# Patient Record
Sex: Male | Born: 1975
Health system: Southern US, Community
[De-identification: ages and names within clinical notes are randomized; demographics above are authoritative.]

## PROBLEM LIST (undated history)

## (undated) DIAGNOSIS — C801 Malignant (primary) neoplasm, unspecified: Secondary | ICD-10-CM

## (undated) DIAGNOSIS — D573 Sickle-cell trait: Secondary | ICD-10-CM

## (undated) DIAGNOSIS — I10 Essential (primary) hypertension: Secondary | ICD-10-CM

## (undated) DIAGNOSIS — R1909 Other intra-abdominal and pelvic swelling, mass and lump: Secondary | ICD-10-CM

## (undated) HISTORY — PX: NO PAST SURGERIES: SHX2092

---

## 2008-05-04 ENCOUNTER — Emergency Department (HOSPITAL_COMMUNITY): Admission: EM | Admit: 2008-05-04 | Discharge: 2008-05-04 | Payer: Self-pay | Admitting: Emergency Medicine

## 2009-04-14 ENCOUNTER — Encounter: Admission: RE | Admit: 2009-04-14 | Discharge: 2009-04-14 | Payer: Self-pay | Admitting: Urology

## 2011-04-10 ENCOUNTER — Emergency Department (HOSPITAL_COMMUNITY)
Admission: EM | Admit: 2011-04-10 | Discharge: 2011-04-10 | Disposition: A | Discharge status: Home / Self Care | Payer: No Typology Code available for payment source | Attending: Emergency Medicine | Admitting: Emergency Medicine

## 2011-04-10 DIAGNOSIS — S339XXA Sprain of unspecified parts of lumbar spine and pelvis, initial encounter: Secondary | ICD-10-CM | POA: Insufficient documentation

## 2011-04-10 DIAGNOSIS — F17200 Nicotine dependence, unspecified, uncomplicated: Secondary | ICD-10-CM | POA: Insufficient documentation

## 2011-04-10 DIAGNOSIS — M545 Low back pain, unspecified: Secondary | ICD-10-CM | POA: Insufficient documentation

## 2011-04-10 DIAGNOSIS — S39012A Strain of muscle, fascia and tendon of lower back, initial encounter: Secondary | ICD-10-CM

## 2011-04-10 MED ORDER — KETOROLAC TROMETHAMINE 60 MG/2ML IM SOLN
INTRAMUSCULAR | Status: AC
  Filled 2011-04-10: qty 2

## 2011-04-10 MED ORDER — DIAZEPAM 5 MG PO TABS
5.0000 mg | ORAL_TABLET | Freq: Once | ORAL | Status: AC
  Administered 2011-04-10: 5 mg via ORAL
  Filled 2011-04-10: qty 1

## 2011-04-10 MED ORDER — KETOROLAC TROMETHAMINE 60 MG/2ML IM SOLN
60.0000 mg | Freq: Once | INTRAMUSCULAR | Status: AC
  Administered 2011-04-10: 60 mg via INTRAMUSCULAR
  Filled 2011-04-10: qty 2

## 2011-04-10 MED ORDER — DIAZEPAM 5 MG PO TABS: 5.0000 mg | ORAL_TABLET | Freq: Three times a day (TID) | ORAL | Status: AC | PRN

## 2011-04-10 MED ORDER — IBUPROFEN 800 MG PO TABS: 800.0000 mg | ORAL_TABLET | Freq: Three times a day (TID) | ORAL | Status: AC
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
## 2011-04-11 NOTE — ED Provider Notes (Signed)
Medical screening examination/treatment/procedure(s) were performed by non-physician practitioner and as supervising physician I was immediately available for consultation/collaboration. Akshara Blumenthal Y.   Huong Luthi Y. Ronny Korff, MD 04/11/11 1027 

## 2011-11-27 ENCOUNTER — Encounter (HOSPITAL_COMMUNITY): Payer: Self-pay | Admitting: Emergency Medicine

## 2011-11-27 ENCOUNTER — Emergency Department (HOSPITAL_COMMUNITY)
Admission: EM | Admit: 2011-11-27 | Discharge: 2011-11-28 | Disposition: A | Discharge status: Home / Self Care | Payer: Self-pay | Attending: Emergency Medicine | Admitting: Emergency Medicine

## 2011-11-27 DIAGNOSIS — F17200 Nicotine dependence, unspecified, uncomplicated: Secondary | ICD-10-CM | POA: Insufficient documentation

## 2011-11-27 DIAGNOSIS — L237 Allergic contact dermatitis due to plants, except food: Secondary | ICD-10-CM

## 2011-11-27 DIAGNOSIS — L255 Unspecified contact dermatitis due to plants, except food: Secondary | ICD-10-CM | POA: Insufficient documentation

## 2011-11-27 NOTE — ED Notes (Signed)
Pt alert, nad, arrives from home, c/o rash to hands and arms, onset several days ago, resp even unlabored

## 2011-11-28 MED ORDER — DEXAMETHASONE SODIUM PHOSPHATE 10 MG/ML IJ SOLN
10.0000 mg | Freq: Once | INTRAMUSCULAR | Status: AC
  Administered 2011-11-28: 10 mg via INTRAMUSCULAR
  Filled 2011-11-28: qty 1

## 2011-11-28 NOTE — ED Provider Notes (Signed)
History     CSN: [id number]  Arrival date & time 11/27/11  2246   First MD Initiated Contact with Patient 11/28/11 0044      Chief Complaint  Patient presents with  . Poison Ivy   HPI  History provided by the patient. Patient is a 36 year old male with no significant PMH who presents with concerns for rash of arms from poison ivy. Patient reports working outside last Tuesday 5 days ago. Patient believes he was in contact with poison ivy or poison oak and developed a rash on his forearms. Rash has been read with bumps and occasional "fluid blisters". Patient has been taking Benadryl for the itching symptoms as well as using, and motion. Patient states rash seems like it is not improving and is spreading some of the arm. Patient reports wash all clothing in laundry. He has no pets. Patient denies any other symptoms.    History reviewed. No pertinent past medical history.  History reviewed. No pertinent past surgical history.  No family history on file.  History  Substance Use Topics  . Smoking status: Current Everyday Smoker  . Smokeless tobacco: Not on file  . Alcohol Use: Yes      Review of Systems  Constitutional: Negative for fever and chills.  Respiratory: Negative for shortness of breath.   Cardiovascular: Negative for chest pain.  Skin: Positive for rash.    Allergies  Review of patient's allergies indicates no known allergies.  Home Medications  No current outpatient prescriptions on file.  BP 156/89  Pulse 80  Temp 98 F (36.7 C) (Oral)  Resp 16  Wt 195 lb (88.451 kg)  SpO2 99%  Physical Exam  Nursing note and vitals reviewed. Constitutional: He is oriented to person, place, and time. He appears well-developed and well-nourished. No distress.  HENT:  Head: Normocephalic.  Neck: Normal range of motion. Neck supple.  Cardiovascular: Normal rate and regular rhythm.   Pulmonary/Chest: Effort normal and breath sounds normal. No stridor. No respiratory  distress. He has no wheezes. He has no rales.  Neurological: He is alert and oriented to person, place, and time.  Skin: Skin is warm. Rash noted.       Erythematous macular papular rash along the dorsal surface of bilateral forearms with some bulla.    ED Course  Procedures    1. Contact dermatitis due to poison ivy       MDM  Patient seen and evaluated. Rash is consistent with poison ivy.       Angus Seller, [place] 11/28/11 [phone]

## 2011-11-28 NOTE — ED Provider Notes (Signed)
Medical screening examination/treatment/procedure(s) were performed by non-physician practitioner and as supervising physician I was immediately available for consultation/collaboration.  Tierre Gerard M Tayo Maute, MD 11/28/11 0728 

## 2016-01-09 ENCOUNTER — Emergency Department (HOSPITAL_COMMUNITY)
Admission: EM | Admit: 2016-01-09 | Discharge: 2016-01-09 | Disposition: A | Discharge status: Home / Self Care | Payer: Self-pay | Attending: Emergency Medicine | Admitting: Emergency Medicine

## 2016-01-09 ENCOUNTER — Encounter (HOSPITAL_COMMUNITY): Payer: Self-pay | Admitting: Emergency Medicine

## 2016-01-09 DIAGNOSIS — N481 Balanitis: Secondary | ICD-10-CM | POA: Insufficient documentation

## 2016-01-09 DIAGNOSIS — F17200 Nicotine dependence, unspecified, uncomplicated: Secondary | ICD-10-CM | POA: Insufficient documentation

## 2016-01-09 DIAGNOSIS — N4889 Other specified disorders of penis: Secondary | ICD-10-CM

## 2016-01-09 MED ORDER — IBUPROFEN 800 MG PO TABS: 800.0000 mg | ORAL_TABLET | Freq: Three times a day (TID) | ORAL | 0 refills | Status: DC | PRN

## 2016-01-09 MED ORDER — HYDROCODONE-ACETAMINOPHEN 5-325 MG PO TABS: 1.0000 | ORAL_TABLET | Freq: Four times a day (QID) | ORAL | 0 refills | Status: DC | PRN

## 2016-01-09 MED ORDER — LIDOCAINE HCL (PF) 1 % IJ SOLN
INTRAMUSCULAR | Status: AC
  Filled 2016-01-09: qty 5

## 2016-01-09 MED ORDER — HYDROMORPHONE HCL 1 MG/ML IJ SOLN
1.0000 mg | Freq: Once | INTRAMUSCULAR | Status: DC
Start: 2016-01-09 — End: 2016-01-09

## 2016-01-09 MED ORDER — CEFTRIAXONE SODIUM 250 MG IJ SOLR
250.0000 mg | Freq: Once | INTRAMUSCULAR | Status: AC
Start: 2016-01-09 — End: 2016-01-09
  Administered 2016-01-09: 250 mg via INTRAMUSCULAR
  Filled 2016-01-09: qty 250

## 2016-01-09 MED ORDER — AZITHROMYCIN 250 MG PO TABS
1000.0000 mg | ORAL_TABLET | Freq: Once | ORAL | Status: AC
  Administered 2016-01-09: 1000 mg via ORAL
  Filled 2016-01-09: qty 4

## 2016-01-09 NOTE — ED Triage Notes (Signed)
Woke up this morning with a lot of penile swelling. Denies any known irritant or injury. Cannot move foreskin at this time; able to pass urine.

## 2016-01-09 NOTE — Discharge Instructions (Signed)
Return here tomorrow for a recheck. If your condition worsens you will need to go to [facility] for Urology assessment. Use warm water soaks and gently retract foreskin.

## 2016-01-10 ENCOUNTER — Encounter (HOSPITAL_COMMUNITY): Payer: Self-pay | Admitting: Emergency Medicine

## 2016-01-10 ENCOUNTER — Emergency Department (HOSPITAL_COMMUNITY)
Admission: EM | Admit: 2016-01-10 | Discharge: 2016-01-10 | Disposition: A | Discharge status: Home / Self Care | Payer: Self-pay | Attending: Emergency Medicine | Admitting: Emergency Medicine

## 2016-01-10 DIAGNOSIS — N481 Balanitis: Secondary | ICD-10-CM | POA: Insufficient documentation

## 2016-01-10 DIAGNOSIS — F17200 Nicotine dependence, unspecified, uncomplicated: Secondary | ICD-10-CM | POA: Insufficient documentation

## 2016-01-10 NOTE — ED Triage Notes (Signed)
Was told to come back today for recheck--

## 2016-01-10 NOTE — ED Provider Notes (Signed)
  [place] DEPT Provider Note   CSN: [id number] Arrival date & time: 01/10/16  1127     History   Chief Complaint Chief Complaint  Patient presents with  . Follow-up  . ballantitis    HPI Ronald Wilson is a 40 y.o. male.  HPI Patient presents to the emergency department for a follow-up from yesterday on balanitis.  The patient states that his condition has not worsened and he still able to urinate.  The patient states that nothing seems to make the condition worse overnight History reviewed. No pertinent past medical history.  There are no active problems to display for this patient.   History reviewed. No pertinent surgical history.     Home Medications    Prior to Admission medications   Medication Sig Start Date End Date Taking? Authorizing Provider  HYDROcodone-acetaminophen (NORCO/VICODIN) 5-325 MG tablet Take 1 tablet by mouth every 6 (six) hours as needed for moderate pain. 01/09/16   Dalia Heading, PA-C  ibuprofen (ADVIL,MOTRIN) 800 MG tablet Take 1 tablet (800 mg total) by mouth every 8 (eight) hours as needed. 01/09/16   Dalia Heading, PA-C    Family History No family history on file.  Social History Social History  Substance Use Topics  . Smoking status: Current Every Day Smoker  . Smokeless tobacco: Never Used  . Alcohol use Yes     Comment: 2-3 times a week     Allergies   Review of patient's allergies indicates no known allergies.   Review of Systems Review of Systems All other systems negative except as documented in the HPI. All pertinent positives and negatives as reviewed in the HPI.   Physical Exam Updated Vital Signs BP 147/85 (BP Location: Right Arm)   Pulse 118   Temp 99.1 F (37.3 C) (Oral)   Resp 20   SpO2 98%   Physical Exam  Genitourinary: Testes normal. Uncircumcised. No penile tenderness. No discharge found.        ED Treatments / Results  Labs (all labs ordered are listed, but only abnormal  results are displayed) Labs Reviewed - No data to display  EKG  EKG Interpretation None       Radiology No results found.  Procedures Procedures (including critical care time)  Medications Ordered in ED Medications - No data to display   Initial Impression / Assessment and Plan / ED Course  I have reviewed the triage vital signs and the nursing notes.  Pertinent labs & imaging results that were available during my care of the patient were reviewed by me and considered in my medical decision making (see chart for details).  Clinical Course   Patient will be referred to urology.  I again advised him to go to [facility] for any worsening in his condition.  The patient voices an understanding and all questions were answered has been some improvement in the area as I am able to retract the foreskin further than yesterday   Final Clinical Impressions(s) / ED Diagnoses   Final diagnoses:  None    New Prescriptions New Prescriptions   No medications on file     Dalia Heading, PA-C 01/10/16 [place], MD 01/25/16 [phone]

## 2016-01-10 NOTE — ED Notes (Signed)
Dr. Audie Pinto and Cathi Roan, PA at bedside to assess patient.

## 2016-01-10 NOTE — Discharge Instructions (Signed)
Go to [facility] for any worsening in your condition.  Follow-up with the urologist provided by calling his office first thing in the morning for an appointment.  Continue to use warm bath soaks and gently attempt to retract the foreskin

## 2016-01-10 NOTE — ED Provider Notes (Signed)
[place] DEPT Provider Note   CSN: [id number] Arrival date & time: 01/09/16  1129     History   Chief Complaint Chief Complaint  Patient presents with  . Groin Swelling    HPI Ronald Wilson is a 40 y.o. male.  HPI Patient presents to the emergency department with a one-day history of penile swelling.  The patient states that he woke up with swelling over the majority of his penis.  Patient states is still able to urinate and does not have any significant pain to the area.  Patient states that not able to retract the foreskin very much.  He states that nothing seems to make the condition better or worse. The patient denies chest pain, shortness of breath, headache,blurred vision, neck pain, fever, cough, weakness, numbness, dizziness, anorexia, edema, abdominal pain, nausea, vomiting, diarrhea, rash, back pain, dysuria, hematemesis, bloody stool, near syncope, or syncope. History reviewed. No pertinent past medical history.  There are no active problems to display for this patient.   History reviewed. No pertinent surgical history.     Home Medications    Prior to Admission medications   Medication Sig Start Date End Date Taking? Authorizing Provider  HYDROcodone-acetaminophen (NORCO/VICODIN) 5-325 MG tablet Take 1 tablet by mouth every 6 (six) hours as needed for moderate pain. 01/09/16   Dalia Heading, PA-C  ibuprofen (ADVIL,MOTRIN) 800 MG tablet Take 1 tablet (800 mg total) by mouth every 8 (eight) hours as needed. 01/09/16   Dalia Heading, PA-C    Family History History reviewed. No pertinent family history.  Social History Social History  Substance Use Topics  . Smoking status: Current Every Day Smoker  . Smokeless tobacco: Never Used  . Alcohol use Yes     Comment: 2-3 times a week     Allergies   Review of patient's allergies indicates no known allergies.   Review of Systems Review of Systems All other systems negative except as  documented in the HPI. All pertinent positives and negatives as reviewed in the HPI.  Physical Exam Updated Vital Signs BP 151/98   Pulse 91   Temp 99.2 F (37.3 C) (Oral)   Resp 20   Ht 5\' 10"  (1.778 m)   Wt 88.5 kg   SpO2 99%   BMI 27.98 kg/m   Physical Exam  Constitutional: He is oriented to person, place, and time. He appears well-developed and well-nourished. No distress.  HENT:  Head: Normocephalic and atraumatic.  Mouth/Throat: Oropharynx is clear and moist.  Eyes: Pupils are equal, round, and reactive to light.  Neck: Normal range of motion. Neck supple.  Cardiovascular: Normal rate, regular rhythm and normal heart sounds.  Exam reveals no gallop and no friction rub.   No murmur heard. Pulmonary/Chest: Effort normal and breath sounds normal. No respiratory distress. He has no wheezes.  Abdominal: Soft. Bowel sounds are normal. He exhibits no distension. There is no tenderness.  Genitourinary:     Neurological: He is alert and oriented to person, place, and time. He exhibits normal muscle tone. Coordination normal.  Skin: Skin is warm and dry. No rash noted. No erythema.  Psychiatric: He has a normal mood and affect. His behavior is normal.  Nursing note and vitals reviewed.    ED Treatments / Results  Labs (all labs ordered are listed, but only abnormal results are displayed) Labs Reviewed  GC/CHLAMYDIA PROBE AMP ([place]) NOT AT [facility]    EKG  EKG Interpretation None       Radiology  No results found.  Procedures Procedures (including critical care time)  Medications Ordered in ED Medications  cefTRIAXone (ROCEPHIN) injection 250 mg (250 mg Intramuscular Given 01/09/16 1513)  azithromycin (ZITHROMAX) tablet 1,000 mg (1,000 mg Oral Given 01/09/16 1515)     Initial Impression / Assessment and Plan / ED Course  I have reviewed the triage vital signs and the nursing notes.  Pertinent labs & imaging results that were available during my care of  the patient were reviewed by me and considered in my medical decision making (see chart for details).  Clinical Course    The patient will be treated for STDs.  Told to return here tomorrow for recheck.  Also given urology follow-up.  Told to use warm bath soaks and gently retract the foreskin  Final Clinical Impressions(s) / ED Diagnoses   Final diagnoses:  Balanitis  Penile swelling    New Prescriptions Discharge Medication List as of 01/09/2016  3:15 PM    START taking these medications   Details  HYDROcodone-acetaminophen (NORCO/VICODIN) 5-325 MG tablet Take 1 tablet by mouth every 6 (six) hours as needed for moderate pain., Starting Sat 01/09/2016, Print    ibuprofen (ADVIL,MOTRIN) 800 MG tablet Take 1 tablet (800 mg total) by mouth every 8 (eight) hours as needed., Starting Sat 01/09/2016, Print         Dalia Heading, PA-C 01/10/16 2004    Leonard Schwartz, MD 01/25/16 [phone]

## 2016-01-11 LAB — GC/CHLAMYDIA PROBE AMP (~~LOC~~) NOT AT ARMC
Chlamydia: NEGATIVE
Neisseria Gonorrhea: NEGATIVE

## 2016-10-18 ENCOUNTER — Emergency Department (HOSPITAL_COMMUNITY): Payer: Self-pay

## 2016-10-18 ENCOUNTER — Encounter (HOSPITAL_COMMUNITY): Payer: Self-pay | Admitting: Emergency Medicine

## 2016-10-18 ENCOUNTER — Emergency Department (HOSPITAL_COMMUNITY)
Admission: EM | Admit: 2016-10-18 | Discharge: 2016-10-19 | Disposition: A | Discharge status: Home / Self Care | Payer: Self-pay | Attending: Emergency Medicine | Admitting: Emergency Medicine

## 2016-10-18 DIAGNOSIS — F17210 Nicotine dependence, cigarettes, uncomplicated: Secondary | ICD-10-CM | POA: Insufficient documentation

## 2016-10-18 DIAGNOSIS — R600 Localized edema: Secondary | ICD-10-CM

## 2016-10-18 DIAGNOSIS — R2242 Localized swelling, mass and lump, left lower limb: Secondary | ICD-10-CM | POA: Insufficient documentation

## 2016-10-18 NOTE — ED Notes (Signed)
While  Having pt get undressed into a gown legs are assessed. Pt left leg is double the size of the right with no injury noted. Pt increased to an acuity 3.

## 2016-10-18 NOTE — ED Triage Notes (Addendum)
Pt c/o left ankle and foot swelling beginning 2 days ago. Denies injury/pain. States elevating extremity helps some. No swelling to right side.

## 2016-10-18 NOTE — ED Provider Notes (Signed)
[place] DEPT Provider Note   CSN: [id number] Arrival date & time: 10/18/16  2109  By signing my name below, I, Ephriam Jenkins, attest that this documentation has been prepared under the direction and in the presence of Malaijah Houchen PA-C.  Electronically Signed: Ephriam Jenkins, ED Scribe. 10/18/16. 12:00 AM.  History   Chief Complaint Chief Complaint  Patient presents with  . Leg Swelling    HPI HPI Comments: Ronald Wilson is a 41 y.o. male who presents to the Emergency Department complaining of persistent left lower extremity swelling that started 3 days ago. Pt notes mild pain in the extremity but states that it feels tight. He reports an exacerbation of pain when bearing weight on the extremity and during ambulation. He denies any known injury. No Hx of DVT. No recent surgeries. No recent long distance travel. No Hx of cancer. No chest pain, SOB, hemoptysis.  The history is provided by the patient. No language interpreter was used.    History reviewed. No pertinent past medical history.  There are no active problems to display for this patient.   History reviewed. No pertinent surgical history.   Home Medications    Prior to Admission medications   Medication Sig Start Date End Date Taking? Authorizing Provider  HYDROcodone-acetaminophen (NORCO/VICODIN) 5-325 MG tablet Take 1 tablet by mouth every 6 (six) hours as needed for moderate pain. 01/09/16   Lawyer, Harrell Gave, PA-C  ibuprofen (ADVIL,MOTRIN) 800 MG tablet Take 1 tablet (800 mg total) by mouth every 8 (eight) hours as needed. 01/09/16   Dalia Heading, PA-C   Family History No family history on file.  Social History Social History  Substance Use Topics  . Smoking status: Current Every Day Smoker  . Smokeless tobacco: Never Used  . Alcohol use Yes     Comment: 2-3 times a week    Allergies   Patient has no known allergies.   Review of Systems Review of Systems  Respiratory: Negative for shortness  of breath.   Cardiovascular: Negative for chest pain.  Musculoskeletal:       Left lower extremity swelling     Physical Exam Updated Vital Signs BP (!) 158/78 (BP Location: Left Arm)   Pulse 100   Temp 98.4 F (36.9 C) (Oral)   Resp 18   Ht 5\' 9"  (1.753 m)   Wt 88.9 kg (196 lb)   SpO2 98%   BMI 28.94 kg/m   Physical Exam  Constitutional: He appears well-developed and well-nourished. No distress.  HENT:  Head: Normocephalic and atraumatic.  Eyes: Conjunctivae and EOM are normal. No scleral icterus.  Neck: Normal range of motion.  Pulmonary/Chest: Effort normal. No respiratory distress.  Musculoskeletal:  LLE significantly swollen from middle of thigh down to foot, compared to right lower extremity. There is no color or temperature change noted. Tenderness to palpation throughout the lower extremity. There is calf tenderness present.2+ DP pulses.   Neurological: He is alert.  Skin: No rash noted. He is not diaphoretic.  Psychiatric: He has a normal mood and affect.  Nursing note and vitals reviewed.    ED Treatments / Results  DIAGNOSTIC STUDIES: Oxygen Saturation is 98% on RA, normal by my interpretation.  COORDINATION OF CARE: 11:51 PM-Discussed treatment plan with pt at bedside and pt agreed to plan.   Labs (all labs ordered are listed, but only abnormal results are displayed) Labs Reviewed - No data to display  EKG  EKG Interpretation None       Radiology  Dg Ankle Complete Left  Result Date: 10/18/2016 CLINICAL DATA:  Initial evaluation for acute swelling about left foot and ankle for 3 days. No pain. EXAM: LEFT ANKLE COMPLETE - 3+ VIEW; LEFT FOOT - COMPLETE 3+ VIEW COMPARISON:  None. FINDINGS: No acute fracture or dislocation about the left ankle or foot. Ankle mortise is approximated. Joint spaces well maintained without evidence for significant degenerative or erosive arthropathy. Osseous mineralization normal. Tiny posterior calcaneal enthesophyte  noted. Diffuse soft tissue swelling seen about the ankle and foot. No radiopaque foreign body. No soft tissue emphysema. IMPRESSION: 1. No acute osseous abnormality about the left ankle or foot. 2. Diffuse soft tissue swelling about the ankle and foot. Electronically Signed   By: Jeannine Boga M.D.   On: 10/18/2016 22:14   Dg Foot Complete Left  Result Date: 10/18/2016 CLINICAL DATA:  Initial evaluation for acute swelling about left foot and ankle for 3 days. No pain. EXAM: LEFT ANKLE COMPLETE - 3+ VIEW; LEFT FOOT - COMPLETE 3+ VIEW COMPARISON:  None. FINDINGS: No acute fracture or dislocation about the left ankle or foot. Ankle mortise is approximated. Joint spaces well maintained without evidence for significant degenerative or erosive arthropathy. Osseous mineralization normal. Tiny posterior calcaneal enthesophyte noted. Diffuse soft tissue swelling seen about the ankle and foot. No radiopaque foreign body. No soft tissue emphysema. IMPRESSION: 1. No acute osseous abnormality about the left ankle or foot. 2. Diffuse soft tissue swelling about the ankle and foot. Electronically Signed   By: Jeannine Boga M.D.   On: 10/18/2016 22:14    Procedures Procedures (including critical care time)  Medications Ordered in ED Medications  enoxaparin (LOVENOX) injection 90 mg (90 mg Subcutaneous Given 10/19/16 0031)     Initial Impression / Assessment and Plan / ED Course  I have reviewed the triage vital signs and the nursing notes.  Pertinent labs & imaging results that were available during my care of the patient were reviewed by me and considered in my medical decision making (see chart for details).     Patient presents to the ED for left lower extremity swelling that occurred 3 days ago. He states that symptoms began suddenly and he denies any injury, trauma or recent accident that brought the pain on. He is able to family but he reports pain with ambulation. He denies any previous  history of DVT, PE, recent surgery, recent immobilization, history of cancer, chest pain, hemoptysis, shortness of breath or palpitations. On physical exam his left lower extremity is significantly swollen from the middle of the thigh down to the foot. There is Tenderness present. Right lower extremity much smaller in comparison and appears normal. Pulses are present bilaterally. X-rays of the ankle and foot were negative for fracture, dislocation or other acute abnormality. There was significant soft tissue swelling noted. I have concern for DVT based on his symptoms and Well's score of 3. I have low likelihood for PE considering patient denies any chest pain, hemoptysis, shortness of breath and no previous history of PE. He is satting at 99% on room air. Heart rate normal today. Due to the fact that ultrasound is not available today, will give patient a shot of therapeutic Lovenox here in the ED and schedule his ultrasound for the morning. Patient states that he has a court date in the morning however I told him that I would give him a note as he will not be able to make it to his court date as it is very important  for him to return to the hospital in the morning for his ultrasound as this can be a life-threatening condition. Patient voiced understanding and agreed to return in the morning. Patient given note for court. Patient appears stable for discharge at this time. Strict return precautions given.  Final Clinical Impressions(s) / ED Diagnoses   Final diagnoses:  Leg edema    New Prescriptions New Prescriptions   No medications on file  I personally performed the services described in this documentation, which was scribed in my presence. The recorded information has been reviewed and is accurate.     Delia Heady, PA-C 10/19/16 [place], [place], MD 10/21/16 [phone]

## 2016-10-19 ENCOUNTER — Ambulatory Visit (HOSPITAL_BASED_OUTPATIENT_CLINIC_OR_DEPARTMENT_OTHER)
Admission: RE | Admit: 2016-10-19 | Discharge: 2016-10-19 | Disposition: A | Discharge status: Home / Self Care | Payer: Self-pay | Source: Ambulatory Visit | Attending: Emergency Medicine | Admitting: Emergency Medicine

## 2016-10-19 DIAGNOSIS — M7989 Other specified soft tissue disorders: Secondary | ICD-10-CM

## 2016-10-19 MED ORDER — ENOXAPARIN SODIUM 100 MG/ML ~~LOC~~ SOLN
90.0000 mg | Freq: Once | SUBCUTANEOUS | Status: AC
  Administered 2016-10-19: 90 mg via SUBCUTANEOUS
  Filled 2016-10-19: qty 1

## 2016-10-19 NOTE — ED Notes (Signed)
Pt is in stable condition upon d/c and ambulates from ED. 

## 2016-10-19 NOTE — Progress Notes (Addendum)
**  Preliminary report by tech**  Left lower extremity venous duplex complete. There is no obvious evidence of deep or superficial vein thrombosis involving the left lower extremity. All clearly visualized vessels appear patent and compressible. There is no evidence of a Baker's cyst on the left. Incidental findings are consistent with an enlarged lymph node measuring 1.2 cm high by 1.8 wide by 3.1 cm long on the left.  10/19/16 9:03 AM Carlos Levering RVT

## 2016-10-19 NOTE — Discharge Instructions (Signed)
Please return in the morning for your ultrasound of the lower extremity. Return to ED for worsening pain, shortness of breath, chest pain, coughing up blood, trouble breathing.

## 2016-11-17 ENCOUNTER — Encounter (HOSPITAL_COMMUNITY): Payer: Self-pay | Admitting: Emergency Medicine

## 2016-11-17 ENCOUNTER — Encounter (HOSPITAL_COMMUNITY): Payer: Self-pay | Admitting: *Deleted

## 2016-11-17 ENCOUNTER — Ambulatory Visit (HOSPITAL_COMMUNITY)
Admission: EM | Admit: 2016-11-17 | Discharge: 2016-11-17 | Disposition: A | Discharge status: Nursing Home (Includes ICF) | Payer: Self-pay

## 2016-11-17 ENCOUNTER — Emergency Department (HOSPITAL_COMMUNITY): Payer: Medicaid Other

## 2016-11-17 ENCOUNTER — Inpatient Hospital Stay (HOSPITAL_COMMUNITY)
Admission: EM | Admit: 2016-11-17 | Discharge: 2016-11-23 | DRG: 804 | Disposition: A | Discharge status: Home / Self Care | Payer: Medicaid Other | Attending: Internal Medicine | Admitting: Internal Medicine

## 2016-11-17 ENCOUNTER — Emergency Department (HOSPITAL_BASED_OUTPATIENT_CLINIC_OR_DEPARTMENT_OTHER)
Admit: 2016-11-17 | Discharge: 2016-11-17 | Disposition: A | Discharge status: Home / Self Care | Payer: Medicaid Other | Attending: Emergency Medicine | Admitting: Emergency Medicine

## 2016-11-17 DIAGNOSIS — M7989 Other specified soft tissue disorders: Secondary | ICD-10-CM

## 2016-11-17 DIAGNOSIS — N485 Ulcer of penis: Secondary | ICD-10-CM | POA: Diagnosis present

## 2016-11-17 DIAGNOSIS — R590 Localized enlarged lymph nodes: Principal | ICD-10-CM | POA: Diagnosis present

## 2016-11-17 DIAGNOSIS — F17210 Nicotine dependence, cigarettes, uncomplicated: Secondary | ICD-10-CM | POA: Diagnosis present

## 2016-11-17 DIAGNOSIS — R601 Generalized edema: Secondary | ICD-10-CM

## 2016-11-17 DIAGNOSIS — D649 Anemia, unspecified: Secondary | ICD-10-CM | POA: Diagnosis present

## 2016-11-17 DIAGNOSIS — Z8249 Family history of ischemic heart disease and other diseases of the circulatory system: Secondary | ICD-10-CM

## 2016-11-17 DIAGNOSIS — N4889 Other specified disorders of penis: Secondary | ICD-10-CM

## 2016-11-17 DIAGNOSIS — R1909 Other intra-abdominal and pelvic swelling, mass and lump: Secondary | ICD-10-CM

## 2016-11-17 DIAGNOSIS — E876 Hypokalemia: Secondary | ICD-10-CM | POA: Diagnosis present

## 2016-11-17 DIAGNOSIS — M79605 Pain in left leg: Secondary | ICD-10-CM

## 2016-11-17 DIAGNOSIS — R600 Localized edema: Secondary | ICD-10-CM | POA: Diagnosis present

## 2016-11-17 DIAGNOSIS — I10 Essential (primary) hypertension: Secondary | ICD-10-CM | POA: Diagnosis not present

## 2016-11-17 DIAGNOSIS — A539 Syphilis, unspecified: Secondary | ICD-10-CM | POA: Diagnosis present

## 2016-11-17 DIAGNOSIS — D573 Sickle-cell trait: Secondary | ICD-10-CM | POA: Diagnosis present

## 2016-11-17 DIAGNOSIS — F17200 Nicotine dependence, unspecified, uncomplicated: Secondary | ICD-10-CM | POA: Diagnosis present

## 2016-11-17 HISTORY — DX: Other intra-abdominal and pelvic swelling, mass and lump: R19.09

## 2016-11-17 HISTORY — DX: Sickle-cell trait: D57.3

## 2016-11-17 LAB — BASIC METABOLIC PANEL
ANION GAP: 6 (ref 5–15)
BUN: 11 mg/dL (ref 6–20)
CALCIUM: 9.4 mg/dL (ref 8.9–10.3)
CO2: 30 mmol/L (ref 22–32)
Chloride: 104 mmol/L (ref 101–111)
Creatinine, Ser: 1.15 mg/dL (ref 0.61–1.24)
GLUCOSE: 103 mg/dL — AB (ref 65–99)
POTASSIUM: 3.5 mmol/L (ref 3.5–5.1)
Sodium: 140 mmol/L (ref 135–145)

## 2016-11-17 LAB — URINALYSIS, ROUTINE W REFLEX MICROSCOPIC
BACTERIA UA: NONE SEEN
BILIRUBIN URINE: NEGATIVE
GLUCOSE, UA: NEGATIVE mg/dL
KETONES UR: NEGATIVE mg/dL
NITRITE: NEGATIVE
PH: 6 (ref 5.0–8.0)
Protein, ur: NEGATIVE mg/dL
SPECIFIC GRAVITY, URINE: 1.044 — AB (ref 1.005–1.030)

## 2016-11-17 LAB — CBC WITH DIFFERENTIAL/PLATELET
BASOS ABS: 0.1 10*3/uL (ref 0.0–0.1)
BASOS PCT: 0 %
Eosinophils Absolute: 0.2 10*3/uL (ref 0.0–0.7)
Eosinophils Relative: 2 %
HEMATOCRIT: 32.4 % — AB (ref 39.0–52.0)
HEMOGLOBIN: 10.7 g/dL — AB (ref 13.0–17.0)
LYMPHS PCT: 16 %
Lymphs Abs: 1.8 10*3/uL (ref 0.7–4.0)
MCH: 27.2 pg (ref 26.0–34.0)
MCHC: 33 g/dL (ref 30.0–36.0)
MCV: 82.4 fL (ref 78.0–100.0)
MONO ABS: 1 10*3/uL (ref 0.1–1.0)
Monocytes Relative: 9 %
NEUTROS ABS: 8.2 10*3/uL — AB (ref 1.7–7.7)
NEUTROS PCT: 73 %
Platelets: 358 10*3/uL (ref 150–400)
RBC: 3.93 MIL/uL — AB (ref 4.22–5.81)
RDW: 12.4 % (ref 11.5–15.5)
WBC: 11.3 10*3/uL — ABNORMAL HIGH (ref 4.0–10.5)

## 2016-11-17 LAB — RAPID HIV SCREEN (HIV 1/2 AB+AG)
HIV 1/2 ANTIBODIES: NONREACTIVE
HIV-1 P24 ANTIGEN - HIV24: NONREACTIVE

## 2016-11-17 LAB — BRAIN NATRIURETIC PEPTIDE: B Natriuretic Peptide: 32.1 pg/mL (ref 0.0–100.0)

## 2016-11-17 MED ORDER — POTASSIUM CHLORIDE IN NACL 20-0.9 MEQ/L-% IV SOLN
INTRAVENOUS | Status: AC
  Administered 2016-11-18: via INTRAVENOUS
  Filled 2016-11-17 (×2): qty 1000

## 2016-11-17 MED ORDER — PENICILLIN G BENZATHINE 1200000 UNIT/2ML IM SUSP
2.4000 10*6.[IU] | Freq: Once | INTRAMUSCULAR | Status: AC
  Administered 2016-11-17: 2.4 10*6.[IU] via INTRAMUSCULAR
  Filled 2016-11-17: qty 4

## 2016-11-17 MED ORDER — IOPAMIDOL (ISOVUE-300) INJECTION 61%
INTRAVENOUS | Status: AC
  Filled 2016-11-17: qty 100

## 2016-11-17 MED ORDER — HEPARIN SODIUM (PORCINE) 5000 UNIT/ML IJ SOLN
5000.0000 [IU] | Freq: Three times a day (TID) | INTRAMUSCULAR | Status: AC
  Administered 2016-11-18 – 2016-11-20 (×9): 5000 [IU] via SUBCUTANEOUS
  Filled 2016-11-17 (×9): qty 1

## 2016-11-17 MED ORDER — NICOTINE 21 MG/24HR TD PT24: 21.0000 mg | MEDICATED_PATCH | Freq: Every day | TRANSDERMAL | Status: DC | PRN

## 2016-11-17 MED ORDER — KETOROLAC TROMETHAMINE 30 MG/ML IJ SOLN: 30.0000 mg | Freq: Four times a day (QID) | INTRAMUSCULAR | Status: AC | PRN

## 2016-11-17 MED ORDER — IOPAMIDOL (ISOVUE-300) INJECTION 61%
100.0000 mL | Freq: Once | INTRAVENOUS | Status: AC | PRN
Start: 2016-11-17 — End: 2016-11-17
  Administered 2016-11-17: 100 mL via INTRAVENOUS

## 2016-11-17 NOTE — ED Triage Notes (Addendum)
Pt  Has   Swelling  Of  l  Leg      As    Well  As   A  l  Inguinal hernia         pt  Reports  Pain in  The   Leg     Pt  States     He   Was  Seen      In  Er  3  Weeks  Ago  He  claims   And  Had  An  Ultrasound     He  denys  Any  Chest  Pain or  Shortness   Of  Breath       Pt  Has  Lesion on  Penis    With  Foul odor     And   Swelling of  Scrotum  As   Well

## 2016-11-17 NOTE — ED Provider Notes (Signed)
HPI  SUBJECTIVE:  Ronald Wilson is a 41 y.o. male who presents with severe swelling left lower extremity, thigh, and left inguinal mass and penile mass.  He has discomfort in his left thigh and leg due to the swelling.  He denies fever, nausea, weakness, or chills.     History reviewed. No pertinent past medical history.  History reviewed. No pertinent surgical history.  History reviewed. No pertinent family history.  Social History  Substance Use Topics  . Smoking status: Current Every Day Smoker  . Smokeless tobacco: Never Used  . Alcohol use Yes     Comment: 2-3 times a week    No current facility-administered medications for this encounter.   Current Outpatient Prescriptions:  .  HYDROcodone-acetaminophen (NORCO/VICODIN) 5-325 MG tablet, Take 1 tablet by mouth every 6 (six) hours as needed for moderate pain., Disp: 15 tablet, Rfl: 0 .  ibuprofen (ADVIL,MOTRIN) 800 MG tablet, Take 1 tablet (800 mg total) by mouth every 8 (eight) hours as needed., Disp: 21 tablet, Rfl: 0  No Known Allergies   ROS     As noted in HPI.   Physical Exam  BP (!) 178/105 (BP Location: Left Arm)   Pulse 94   Temp 98.5 F (36.9 C) (Oral)   Resp 16   Ht 5\' 9"  (1.753 m)   Wt 194 lb (88 kg)   SpO2 98%   BMI 28.65 kg/m   Constitutional: Well developed, well nourished, no acute distress Eyes:  EOMI, conjunctiva normal bilaterally HENT: Normocephalic, atraumatic,mucus membranes moist Respiratory: Normal inspiratory effort Cardiovascular: Normal rate Extremities - Left lower thigh, leg, and pedal anasarca edema. GI: nondistended, left inguinal area with Mass approx 12 cm diameter that is hard. skin: No rash, skin intact Musculoskeletal: Left thigh, leg, and pedal edema.  Neurologic: Alert & oriented x 3, no focal neuro deficits Psychiatric: Speech and behavior appropriate   ED Course   Medications - No data to display  No orders of the defined types were placed in this  encounter.   No results found for this or any previous visit (from the past 24 hour(s)). No results found.  ED Clinical Impression  Left groin mass  Penile mass  Left leg pain  Anasarca   ED Assessment/Plan  Go straight to ED  [place] Narcotic database reviewed for this patient, and feel that the risk/benefit ratio today is favorable for proceeding with a prescription for controlled substance.  Reviewed labs, imaging independently. Labs, imaging normal. See radiology report for full details.  Discussed labs, imaging, MDM, plan and followup with patient / parent / family. Discussed sn/sx that should prompt return to the ED. Patient / parent / family agrees with plan.   No orders of the defined types were placed in this encounter.   *This clinic note was created using Dragon dictation software. Therefore, there may be occasional mistakes despite careful proofreading.  ?   Lysbeth Penner, FNP 11/17/16 1204

## 2016-11-17 NOTE — Progress Notes (Addendum)
VASCULAR LAB PRELIMINARY  PRELIMINARY  PRELIMINARY  PRELIMINARY  Left lower extremity venous duplex completed.    Preliminary report:  Left:  No obvious evidence of DVT, superficial thrombosis, or Baker's cyst. There is a large vascularized area in the right groin. Unable to adequately measure due to size. Lobular in nature. Lymph node versus mass. Further evaluation may be need to differentiate. This area appears to have increased since study of 10/19/2016  Toma Copier, RVS 11/17/2016, 3:17 PM

## 2016-11-17 NOTE — Discharge Instructions (Signed)
Go straight to ED. °

## 2016-11-17 NOTE — ED Provider Notes (Signed)
[facility] DEPT Provider Note   CSN: [id number] Arrival date & time: 11/17/16  1128     History   Chief Complaint Chief Complaint  Patient presents with  . Leg Swelling    HPI Ronald Wilson is a 41 y.o. male. With no previous medical history presents with left lower shotty swelling to the past month. Patient was previously seen in an urgent care about 1 month ago for similar complaints, DVT ultrasound was negative at time. Review of systems, patient notes that he has some left inguinal swelling for the past 6 months. This has been increasing in size, however it is non-painful. He also has noticed a lesion to the tip of his uncircumcised penis, some intermittent bleeding, however no urinary complaints. He denies any fevers, chills, penile discharge, night sweats, or weight loss. He denies any increased scrotal complaints.   HPI  Past Medical History:  Diagnosis Date  . Inguinal mass 11/17/2016   left  . Sickle cell trait [facility])     Patient Active Problem List   Diagnosis Date Noted  . Inguinal mass 11/17/2016  . 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 Anemia 11/17/2016  . Unilateral edema of left lower extremity 11/17/2016  . Tobacco use disorder 11/17/2016    Past Surgical History:  Procedure Laterality Date  . NO PAST SURGERIES         Home Medications    Prior to Admission medications   Medication Sig Start Date End Date Taking? Authorizing Provider  HYDROcodone-acetaminophen (NORCO/VICODIN) 5-325 MG tablet Take 1 tablet by mouth every 6 (six) hours as needed for moderate pain. Patient not taking: Reported on 11/17/2016 01/09/16   Dalia Heading, PA-C  ibuprofen (ADVIL,MOTRIN) 800 MG tablet Take 1 tablet (800 mg total) by mouth every 8 (eight) hours as needed. Patient not taking: Reported on 11/17/2016 01/09/16   Dalia Heading, PA-C    Family History Family History  Problem Relation Age of Onset  . Hypertension Mother     Social History Social History    Substance Use Topics  . Smoking status: Current Every Day Smoker    Packs/day: 0.25    Years: 25.00    Types: Cigarettes  . Smokeless tobacco: Never Used  . Alcohol use 9.0 oz/week    15 Shots of liquor per week     Comment: "11/17/2016 "I'll have shots ~ 3 days/wk"     Allergies   Patient has no known allergies.   Review of Systems Review of Systems  Constitutional: Negative for chills and fever.  HENT: Negative for ear pain and sore throat.   Eyes: Negative for pain and visual disturbance.  Respiratory: Negative for cough and shortness of breath.   Cardiovascular: Positive for leg swelling (LLE swelling). Negative for chest pain and palpitations.  Gastrointestinal: Negative for abdominal pain and vomiting.  Genitourinary: Positive for penile swelling. Negative for dysuria and hematuria.       Inguinal swelling on left  Musculoskeletal: Negative for arthralgias and back pain.  Skin: Negative for color change and rash.  Neurological: Negative for seizures and syncope.  All other systems reviewed and are negative.    Physical Exam Updated Vital Signs BP (!) 175/89 (BP Location: Right Arm)   Pulse 65   Temp 99.1 F (37.3 C) (Oral)   Resp 18   Ht 5\' 9"  (1.753 m)   Wt 91.1 kg (200 lb 12.8 oz)   SpO2 100%   BMI 29.65 kg/m   Physical Exam  Constitutional: He appears  well-developed and well-nourished.  HENT:  Head: Normocephalic and atraumatic.  Eyes: Conjunctivae are normal.  Neck: Neck supple.  Cardiovascular: Normal rate and regular rhythm.   No murmur heard. Pulmonary/Chest: Effort normal and breath sounds normal. No respiratory distress.  Abdominal: Soft. There is no tenderness.  Genitourinary: Testes normal. Uncircumcised.  Genitourinary Comments: Large excoriation to tip of penis, some bleeding noted on exam  Musculoskeletal: He exhibits edema (pitting edema to LLE entire leg).  Lymphadenopathy: Inguinal adenopathy noted on the left (large left inguinal mass  approximately size of baseball. Nontender, non-mobile) side.  Neurological: He is alert.  Skin: Skin is warm and dry.  Psychiatric: He has a normal mood and affect.  Nursing note and vitals reviewed.    ED Treatments / Results  Labs (all labs ordered are listed, but only abnormal results are displayed) Labs Reviewed  BASIC METABOLIC PANEL - Abnormal; Notable for the following:       Result Value   Glucose, Bld 103 (*)    All other components within normal limits  CBC WITH DIFFERENTIAL/PLATELET - Abnormal; Notable for the following:    WBC 11.3 (*)    RBC 3.93 (*)    Hemoglobin 10.7 (*)    HCT 32.4 (*)    Neutro Abs 8.2 (*)    All other components within normal limits  URINALYSIS, ROUTINE W REFLEX MICROSCOPIC - Abnormal; Notable for the following:    Specific Gravity, Urine 1.044 (*)    Hgb urine dipstick LARGE (*)    Leukocytes, UA MODERATE (*)    Squamous Epithelial / LPF 0-5 (*)    All other components within normal limits  BRAIN NATRIURETIC PEPTIDE  RAPID HIV SCREEN (HIV 1/2 AB+AG)  RPR  HIV ANTIBODY (ROUTINE TESTING)  VITAMIN B12  FOLATE  IRON AND TIBC  FERRITIN  RETICULOCYTES  CBC WITH DIFFERENTIAL/PLATELET  COMPREHENSIVE METABOLIC PANEL  GC/CHLAMYDIA PROBE AMP ([place]) NOT AT [facility]    EKG  EKG Interpretation None       Radiology Ct Abdomen Pelvis W Contrast  Result Date: 11/17/2016 CLINICAL DATA:  Left lower quadrant and groin pain. EXAM: CT ABDOMEN AND PELVIS WITH CONTRAST TECHNIQUE: Multidetector CT imaging of the abdomen and pelvis was performed using the standard protocol following bolus administration of intravenous contrast. CONTRAST:  169mL ISOVUE-300 IOPAMIDOL (ISOVUE-300) INJECTION 61% COMPARISON:  None. FINDINGS: Lower chest: Lung bases are within normal. Hepatobiliary: Within normal. Pancreas: Within normal. Spleen: Within normal. Adrenals/Urinary Tract: Adrenal glands are normal. Kidneys normal in size without hydronephrosis or  nephrolithiasis. There is a 1.2 cm cyst over the mid to lower pole right kidney. Ureters and bladder are normal. Stomach/Bowel: Stomach and small bowel are within normal. Appendix is normal. Minimal diverticulosis of the colon. Vascular/Lymphatic: Minimal calcified plaque over the abdominal aorta. There is a prominent heterogeneous solid and cystic mass with subtle areas of calcification over the left superficial inguinal region. This likely represents adenopathy and measures 8.1 cm in diameter. There are adjacent similar appearing smaller left inguinal lymph nodes. There are similar low-density partially calcified left iliac chain lymph nodes with the largest measuring 6.2 cm in greatest diameter. There is adenopathy in the distal left periaortic region with largest node measuring 2.3 cm by short axis. Reproductive: Within normal. Other: Mild subcutaneous edema over the left inguinal region and left upper leg. Musculoskeletal: Within normal. IMPRESSION: Findings compatible with significant heterogeneous left inguinal and left iliac chain adenopathy with lesser adenopathy in the periaortic region. Mild associated subcutaneous edema over  the left inguinal region and upper leg. Findings are likely due to a neoplastic process, although an infectious/granulomatous process is not excluded. Recommend clinical correlation and diagnostic tissue sampling for further evaluation. 1.2 cm right renal cyst. Mild colonic diverticulosis. Aortic Atherosclerosis (ICD10-I70.0). Electronically Signed   By: Marin Olp M.D.   On: 11/17/2016 17:25    Procedures Procedures (including critical care time)  Medications Ordered in ED Medications  heparin injection 5,000 Units (not administered)  0.9 % NaCl with KCl 20 mEq/ L  infusion ( Intravenous New Bag/Given 11/18/16 0021)  nicotine (NICODERM CQ - dosed in mg/24 hours) patch 21 mg (not administered)  ketorolac (TORADOL) 30 MG/ML injection 30 mg (not administered)  iopamidol  (ISOVUE-300) 61 % injection 100 mL (100 mLs Intravenous Contrast Given 11/17/16 1652)  penicillin g benzathine (BICILLIN LA) 1200000 UNIT/2ML injection 2.4 Million Units (2.4 Million Units Intramuscular Given 11/17/16 2112)     Initial Impression / Assessment and Plan / ED Course  I have reviewed the triage vital signs and the nursing notes.  Pertinent labs & imaging results that were available during my care of the patient were reviewed by me and considered in my medical decision making (see chart for details).     Patient is a 41 year old male presents with multiple complaints of left lower shotty swelling, left inguinal mass and penis excoriation. It appears that lower shotty swelling has worsened over the past month, however is some inguinal mass and penis excoriation have been ongoing for the past 6 months. Patient arrived hemodynamically stable, in no acute distress. Examined above.  Labs significant for no luekocytosis. RPR and HIV pending. UA with hematuria. Doppler LLE negative for DVT. CT abdomen and pelvis significant for left inguinal and left iliac chain adenopathy with lesser adenopathy in the. Aortic region.   Unilateral lower extremity swelling More likely secondary to lymphedema given extensive left inguinal adenopathy. No DVT. Exam and imaging concerning for neoplastic process, however infection from STD cannot be excluded.  Patient is poor historian, seems to lack insight, multiple ED visits with no PCM, and I'm concerned if discharged he will be lost to follow-up. Discussed with hospitalist for admission for further neoplastic and infectious workup, including possible tissue biopsy of inguinal swelling and possible decompression given extensive lymphedema.  Patient in agreement with plan at time of admission.  Patient and plan of care discussed with Attending physician, Dr. Winfred Leeds.   Final Clinical Impressions(s) / ED Diagnoses   Final diagnoses:  Inguinal  lymphadenopathy    New Prescriptions Current Discharge Medication List       Arnetha Massy, MD 11/18/16 2229    Orlie Dakin, MD 11/19/16 [phone]

## 2016-11-17 NOTE — H&P (Signed)
History and Physical    Ronald Wilson HEN:[id number] DOB: 1975/09/24 DOA: 11/17/2016  PCP: Patient, No Pcp Per   Patient coming from: Home.  I have personally briefly reviewed patient's old medical records in [place]  Chief Complaint: Left leg swelling.  HPI: Ronald Wilson is a 41 y.o. male without medical history who is coming to the ED with complaints of several months of progressively worse LLE edema for several months. He was seen last month seen in the [facility], had [place] of lower extremity which was negative for DVT. He was seen for balanitis in October of last year by Dr. Audie Pinto. At that time, he was referred to urology, but has not followed up. He stated that he is heterosexual, but has not had sexual contact since he developed this condition. He has not informed previous sexual partners about this yet. He denies fever, chills, night sweats, urethral discharge, dysuria or scrotal tenderness. He denies sore throat, productive cough, chest pain, dizziness, abdominal pain, nausea, emesis, diarrhea or constipation. He denies dysuria, frequency or hematuria. He lives alone, denies homelessness, sleep disturbance, depression or anxiety symptoms. He denies polyuria, polydipsia or blurred vision. ED providers are concerned that the patient may have a STD or malignancy and not seeking treatment.   ED Course: Initial vital signs temperature 98.1F, pulse 83, respirations 17, blood pressure 166/106 mmHg and O2 sat 98% on room air. His urinalysis showed moderate leukocyte esterase, TNTC RBC, WBC 5-30 and no bacteria. WBC 11.3 with 73% neutrophils, hemoglobin 10.7 g/dL and platelets 358. His basic metabolic profile is normal except for nonfasting glucose at 103 mg/dL. BNP was 32.1 pg/mL. Rapid HIV test was negative. RPR is pending. He declined penile ulcer to be photographed, but allowed me to take picture of large adenopathy. Unfortunately, the picture has not being a letter from the [facility] app to his  chart yet.  Imaging: Doppler ultrasound of the lower extremity did not reveal DVT. CT scan abdomen and pelvis with contrast shows significant her again is left inguinal and left iliac change adenopathy with L adenopathy in the periaortic region. Mild associated subcutaneous edema over the left inguinal region and upper left extremity. Findings are likely due to neoplastic process, although an infectious/granulomatous process is not excluded. Clinical correlation and diagnostic tissue sampling suggested for further evaluation.  Review of Systems: As per HPI otherwise 10 point review of systems negative.    History reviewed. No pertinent past medical history.  History reviewed. No pertinent surgical history.   reports that he has been smoking.  He has never used smokeless tobacco. He reports that he drinks alcohol. He reports that he uses drugs, including Marijuana.  No Known Allergies  Family History  Problem Relation Age of Onset  . Hypertension Mother     Prior to Admission medications   Medication Sig Start Date End Date Taking? Authorizing Provider  HYDROcodone-acetaminophen (NORCO/VICODIN) 5-325 MG tablet Take 1 tablet by mouth every 6 (six) hours as needed for moderate pain. Patient not taking: Reported on 11/17/2016 01/09/16   Dalia Heading, PA-C  ibuprofen (ADVIL,MOTRIN) 800 MG tablet Take 1 tablet (800 mg total) by mouth every 8 (eight) hours as needed. Patient not taking: Reported on 11/17/2016 01/09/16   Dalia Heading, PA-C    Physical Exam: Vitals:   11/17/16 1223 11/17/16 1354 11/17/16 1730 11/17/16 1928  BP:  (!) 165/101 139/80 (!) 150/95  Pulse:  71 66 82  Resp:  16    Temp:  TempSrc:      SpO2:  100% 99% 100%  Weight: 88 kg (194 lb)     Height: 5\' 9"  (1.753 m)       Constitutional: NAD, calm, comfortable Eyes: PERRL, lids and conjunctivae normal ENMT: Mucous membranes are moist. Posterior pharynx clear of any exudate or lesions. Neck: normal,  supple, no masses, no thyromegaly Respiratory: clear to auscultation bilaterally, no wheezing, no crackles. Normal respiratory effort. No accessory muscle use.  Cardiovascular: Regular rate and rhythm, no murmurs / rubs / gallops. Unilateral left lower extremity edema. 2+ pedal pulses. No carotid bruits.  Abdomen: Soft, no tenderness, no masses palpated. No hepatosplenomegaly. Bowel sounds positive.  GU: Uncircumcised penis. Painless penile ulcer on glans area associated with a very large, painless adenopathy on left inguinal area Musculoskeletal: no clubbing / cyanosis. Good ROM, no contractures. Normal muscle tone.  Skin: no rashes, lesions, ulcers. No induration Neurologic: CN 2-12 grossly intact. Sensation intact, DTR normal. Strength 5/5 in all 4.  Psychiatric: Normal judgment and insight. Alert and oriented x 4. Normal mood.    Labs on Admission: I have personally reviewed following labs and imaging studies  CBC:  Recent Labs Lab 11/17/16 1233  WBC 11.3*  NEUTROABS 8.2*  HGB 10.7*  HCT 32.4*  MCV 82.4  PLT 062   Basic Metabolic Panel:  Recent Labs Lab 11/17/16 1233  NA 140  K 3.5  CL 104  CO2 30  GLUCOSE 103*  BUN 11  CREATININE 1.15  CALCIUM 9.4   GFR: Estimated Creatinine Clearance: 93.7 mL/min (by C-G formula based on SCr of 1.15 mg/dL). Liver Function Tests: No results for input(s): AST, ALT, ALKPHOS, BILITOT, PROT, ALBUMIN in the last 168 hours. No results for input(s): LIPASE, AMYLASE in the last 168 hours. No results for input(s): AMMONIA in the last 168 hours. Coagulation Profile: No results for input(s): INR, PROTIME in the last 168 hours. Cardiac Enzymes: No results for input(s): CKTOTAL, CKMB, CKMBINDEX, TROPONINI in the last 168 hours. BNP (last 3 results) No results for input(s): PROBNP in the last 8760 hours. HbA1C: No results for input(s): HGBA1C in the last 72 hours. CBG: No results for input(s): GLUCAP in the last 168 hours. Lipid  Profile: No results for input(s): CHOL, HDL, LDLCALC, TRIG, CHOLHDL, LDLDIRECT in the last 72 hours. Thyroid Function Tests: No results for input(s): TSH, T4TOTAL, FREET4, T3FREE, THYROIDAB in the last 72 hours. Anemia Panel: No results for input(s): VITAMINB12, FOLATE, FERRITIN, TIBC, IRON, RETICCTPCT in the last 72 hours. Urine analysis:    Component Value Date/Time   COLORURINE YELLOW 11/17/2016 1719   APPEARANCEUR CLEAR 11/17/2016 1719   LABSPEC 1.044 (H) 11/17/2016 1719   PHURINE 6.0 11/17/2016 1719   GLUCOSEU NEGATIVE 11/17/2016 1719   HGBUR LARGE (A) 11/17/2016 1719   BILIRUBINUR NEGATIVE 11/17/2016 1719   KETONESUR NEGATIVE 11/17/2016 1719   PROTEINUR NEGATIVE 11/17/2016 1719   NITRITE NEGATIVE 11/17/2016 1719   LEUKOCYTESUR MODERATE (A) 11/17/2016 1719    Radiological Exams on Admission: Ct Abdomen Pelvis W Contrast  Result Date: 11/17/2016 CLINICAL DATA:  Left lower quadrant and groin pain. EXAM: CT ABDOMEN AND PELVIS WITH CONTRAST TECHNIQUE: Multidetector CT imaging of the abdomen and pelvis was performed using the standard protocol following bolus administration of intravenous contrast. CONTRAST:  175mL ISOVUE-300 IOPAMIDOL (ISOVUE-300) INJECTION 61% COMPARISON:  None. FINDINGS: Lower chest: Lung bases are within normal. Hepatobiliary: Within normal. Pancreas: Within normal. Spleen: Within normal. Adrenals/Urinary Tract: Adrenal glands are normal. Kidneys normal in size without  hydronephrosis or nephrolithiasis. There is a 1.2 cm cyst over the mid to lower pole right kidney. Ureters and bladder are normal. Stomach/Bowel: Stomach and small bowel are within normal. Appendix is normal. Minimal diverticulosis of the colon. Vascular/Lymphatic: Minimal calcified plaque over the abdominal aorta. There is a prominent heterogeneous solid and cystic mass with subtle areas of calcification over the left superficial inguinal region. This likely represents adenopathy and measures 8.1 cm in  diameter. There are adjacent similar appearing smaller left inguinal lymph nodes. There are similar low-density partially calcified left iliac chain lymph nodes with the largest measuring 6.2 cm in greatest diameter. There is adenopathy in the distal left periaortic region with largest node measuring 2.3 cm by short axis. Reproductive: Within normal. Other: Mild subcutaneous edema over the left inguinal region and left upper leg. Musculoskeletal: Within normal. IMPRESSION: Findings compatible with significant heterogeneous left inguinal and left iliac chain adenopathy with lesser adenopathy in the periaortic region. Mild associated subcutaneous edema over the left inguinal region and upper leg. Findings are likely due to a neoplastic process, although an infectious/granulomatous process is not excluded. Recommend clinical correlation and diagnostic tissue sampling for further evaluation. 1.2 cm right renal cyst. Mild colonic diverticulosis. Aortic Atherosclerosis (ICD10-I70.0). Electronically Signed   By: Marin Olp M.D.   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 The patient has not followed up for treatment as an outpatient. He is not providing much details on HPI. ED concerned that the patient will not follow up Will place in observation for ID consultation in AM. Benzathine penicillin 2.4 million units IM. Toradol 30 mg IVP every 6 hours as needed for inguinal tenderness. Consider diagnostic tissue sampling for further evaluation if neoplastic process suspected.  Active Problems:   Ulcer of penis As above.    Anemia Check anemia panel.    Unilateral edema of left lower extremity Secondary to large left inguinal adenopathy. DVT negative on doppler studies.    Tobacco use disorder. Nicotine replacement therapy ordered. Tobacco cessation information to be provided prior to dischrge.     DVT prophylaxis: Heparin SQ. Code Status: Full code. Family Communication:  Disposition Plan: Observation for further work up. ID will see in AM. Consults called: Dr. Dietrich [place] (Infectious diseases). Admission status: Observation/   Reubin Milan MD [facility] Pager [phone].  If 7PM-7AM, please contact night-coverage [email or web address] Password [email or web address]  11/17/2016, 8:50 PM

## 2016-11-17 NOTE — ED Notes (Signed)
Patient transported to CT 

## 2016-11-17 NOTE — ED Provider Notes (Signed)
Patient with swollen left leg for one month. Leg has become painful for the past one week. He also has a mass in his left groin for several months which is nonpainful. Patient also reports alesion on his penis for several months. On exam patient is in no distress lungs clear auscultation heart regular rate and rhythm abdomen nondistended nontender genitalia glans penis is excoriated. There is a baseball size mass at left groin which is firm and nontender. Left lower extremity is swollen at thigh and lower leg. DP pulse 2+. Good capillary refill all extremities are redness or tenderness neurovascularly intact   Orlie Dakin, MD 11/18/16 [phone]

## 2016-11-17 NOTE — ED Notes (Signed)
Will obtain urine when pt returns from CT.

## 2016-11-17 NOTE — ED Notes (Signed)
Advised   npo

## 2016-11-17 NOTE — ED Triage Notes (Signed)
Onset over a week ago swelling left leg seen in hospital had a xray and ultrasound left leg. States both test negative however seen at urgent care for evaluation for increased swelling left leg. 7/10 achy tightness left leg denies pain penis or scrotum.

## 2016-11-17 NOTE — ED Notes (Signed)
Spoke with Doctor regarding appropriate orders for patient.

## 2016-11-18 DIAGNOSIS — R590 Localized enlarged lymph nodes: Principal | ICD-10-CM

## 2016-11-18 LAB — RETICULOCYTES
RBC.: 3.83 MIL/uL — ABNORMAL LOW (ref 4.22–5.81)
Retic Count, Absolute: 126.4 10*3/uL (ref 19.0–186.0)
Retic Ct Pct: 3.3 % — ABNORMAL HIGH (ref 0.4–3.1)

## 2016-11-18 LAB — CBC WITH DIFFERENTIAL/PLATELET
Basophils Absolute: 0.1 10*3/uL (ref 0.0–0.1)
Basophils Relative: 1 %
Eosinophils Absolute: 0.2 10*3/uL (ref 0.0–0.7)
Eosinophils Relative: 2 %
HEMATOCRIT: 32.1 % — AB (ref 39.0–52.0)
HEMOGLOBIN: 10.8 g/dL — AB (ref 13.0–17.0)
Lymphocytes Relative: 20 %
Lymphs Abs: 1.9 10*3/uL (ref 0.7–4.0)
MCH: 28.2 pg (ref 26.0–34.0)
MCHC: 33.6 g/dL (ref 30.0–36.0)
MCV: 83.8 fL (ref 78.0–100.0)
MONOS PCT: 8 %
Monocytes Absolute: 0.8 10*3/uL (ref 0.1–1.0)
NEUTROS ABS: 6.3 10*3/uL (ref 1.7–7.7)
NEUTROS PCT: 69 %
Platelets: 353 10*3/uL (ref 150–400)
RBC: 3.83 MIL/uL — ABNORMAL LOW (ref 4.22–5.81)
RDW: 12.8 % (ref 11.5–15.5)
WBC: 9.2 10*3/uL (ref 4.0–10.5)

## 2016-11-18 LAB — COMPREHENSIVE METABOLIC PANEL
ALBUMIN: 3.1 g/dL — AB (ref 3.5–5.0)
ALK PHOS: 64 U/L (ref 38–126)
ALT: 17 U/L (ref 17–63)
ANION GAP: 7 (ref 5–15)
AST: 21 U/L (ref 15–41)
BILIRUBIN TOTAL: 0.3 mg/dL (ref 0.3–1.2)
BUN: 7 mg/dL (ref 6–20)
CALCIUM: 8.6 mg/dL — AB (ref 8.9–10.3)
CO2: 29 mmol/L (ref 22–32)
Chloride: 106 mmol/L (ref 101–111)
Creatinine, Ser: 1.17 mg/dL (ref 0.61–1.24)
GFR calc Af Amer: >60 mL/min (ref >60)
GFR calc non Af Amer: >60 mL/min (ref >60)
GLUCOSE: 84 mg/dL (ref 65–99)
Potassium: 3.2 mmol/L — ABNORMAL LOW (ref 3.5–5.1)
Sodium: 142 mmol/L (ref 135–145)
TOTAL PROTEIN: 6.5 g/dL (ref 6.5–8.1)

## 2016-11-18 LAB — FOLATE: FOLATE: 15 ng/mL (ref >5.9)

## 2016-11-18 LAB — IRON AND TIBC
Iron: 26 ug/dL — ABNORMAL LOW (ref 45–182)
SATURATION RATIOS: 9 % — AB (ref 17.9–39.5)
TIBC: 280 ug/dL (ref 250–450)
UIBC: 254 ug/dL

## 2016-11-18 LAB — VITAMIN B12: VITAMIN B 12: 625 pg/mL (ref 180–914)

## 2016-11-18 LAB — HIV ANTIBODY (ROUTINE TESTING W REFLEX): HIV Screen 4th Generation wRfx: NONREACTIVE

## 2016-11-18 LAB — FERRITIN: Ferritin: 110 ng/mL (ref 24–336)

## 2016-11-18 LAB — RPR: RPR Ser Ql: NONREACTIVE

## 2016-11-18 MED ORDER — POTASSIUM CHLORIDE CRYS ER 20 MEQ PO TBCR
40.0000 meq | EXTENDED_RELEASE_TABLET | Freq: Once | ORAL | Status: AC
  Administered 2016-11-18: 40 meq via ORAL
  Filled 2016-11-18: qty 2

## 2016-11-18 NOTE — Progress Notes (Signed)
[facility]  PROGRESS NOTE  Ronald Wilson:[id number] DOB: March 28, 1976 DOA: 11/17/2016 PCP: Patient, No Pcp Per   Brief HPI:   41 y.o. male without medical history who is coming to the ED with complaints of several months of progressively worse LLE edema for several months. He was seen last month seen in the [facility], had [place] of lower extremity which was negative for DVT. He was seen for balanitis in October of last year by Dr. Audie Pinto. At that time, he was referred to urology, but has not followed up. He stated that he is heterosexual, but has not had sexual contact since he developed this condition.     Subjective   Patient denies pain.   Assessment/Plan:     1. Inguinal mass- patient has large left inguinal lymph node, will consult general surgery for biopsy.  2. Penile mass- we'll consult urology for possible penile carcinoma. Patient will need biopsy. 3. Hypokalemia-potassium is 3.0, will replace with K Dur 40 mEq by mouth 2 doses. Follow BMP in a.m. 4. ? STI- patient was empirically given benzathine penicillin G 2.4 million units intramuscularly for possible syphilis. All the labs are negative for STI. RPR is negative, including Chlamydia from urine negative. ID following.    DVT prophylaxis: Heparin  Code Status: Full code  Family Communication: No family at bedside   Disposition Plan: Likely home in next 3-4 days   Consultants:  None  Procedures:  None   Continuous infusions     Antibiotics:   Anti-infectives    Start     Dose/Rate Route Frequency Ordered Stop   11/17/16 2100  penicillin g benzathine (BICILLIN LA) 1200000 UNIT/2ML injection 2.4 Million Units     2.4 Million Units Intramuscular  Once 11/17/16 2056 11/17/16 2112       Objective   Vitals:   11/17/16 2151 11/18/16 0459 11/18/16 1354 11/18/16 1429  BP: (!) 175/89  (!) 180/107 (!) 154/96  Pulse: 65 67 62   Resp: 18 18 18    Temp: 99.1 F (37.3 C) 98.6 F (37 C) 98.4 F (36.9 C)    TempSrc: Oral Oral    SpO2: 100% 100% 100%   Weight: 91.1 kg (200 lb 12.8 oz)     Height:        Intake/Output Summary (Last 24 hours) at 11/18/16 1855 Last data filed at 11/18/16 1813  Gross per 24 hour  Intake          1785.25 ml  Output              250 ml  Net          1535.25 ml   Filed Weights   11/17/16 1223 11/17/16 2150 11/17/16 2151  Weight: 88 kg (194 lb) 91.5 kg (201 lb 11.2 oz) 91.1 kg (200 lb 12.8 oz)     Physical Examination:   Physical Exam: Eyes: No icterus, extraocular muscles intact  Mouth: Oral mucosa is moist, no lesions on palate,  Neck: Supple, no deformities, masses, or tenderness Lungs: Normal respiratory effort, bilateral clear to auscultation, no crackles or wheezes.  Heart: Regular rate and rhythm, S1 and S2 normal, no murmurs, rubs auscultated Abdomen: Large fixed lymph node palpated in the left inguinal region, nontender to palpation GU- uncircumcised penis, painless mass and glands very large painless adenopathy on left inguinal area. Extremities: Left lower extremity edema Neuro : Alert and oriented to time, place and person, No focal deficits  Skin: No rashes seen on exam  Data Reviewed: I have personally reviewed following labs and imaging studies  CBG: No results for input(s): GLUCAP in the last 168 hours.  CBC:  Recent Labs Lab 11/17/16 1233 11/18/16 0326  WBC 11.3* 9.2  NEUTROABS 8.2* 6.3  HGB 10.7* 10.8*  HCT 32.4* 32.1*  MCV 82.4 83.8  PLT 358 025    Basic Metabolic Panel:  Recent Labs Lab 11/17/16 1233 11/18/16 0326  NA 140 142  K 3.5 3.2*  CL 104 106  CO2 30 29  GLUCOSE 103* 84  BUN 11 7  CREATININE 1.15 1.17  CALCIUM 9.4 8.6*    No results found for this or any previous visit (from the past 240 hour(s)).   Liver Function Tests:  Recent Labs Lab 11/18/16 0326  AST 21  ALT 17  ALKPHOS 64  BILITOT 0.3  PROT 6.5  ALBUMIN 3.1*   No results for input(s): LIPASE, AMYLASE in the last 168  hours. No results for input(s): AMMONIA in the last 168 hours.  Cardiac Enzymes: No results for input(s): CKTOTAL, CKMB, CKMBINDEX, TROPONINI in the last 168 hours. BNP (last 3 results)  Recent Labs  11/17/16 1247  BNP 32.1    ProBNP (last 3 results) No results for input(s): PROBNP in the last 8760 hours.    Studies: Ct Abdomen Pelvis W Contrast  Result Date: 11/17/2016 CLINICAL DATA:  Left lower quadrant and groin pain. EXAM: CT ABDOMEN AND PELVIS WITH CONTRAST TECHNIQUE: Multidetector CT imaging of the abdomen and pelvis was performed using the standard protocol following bolus administration of intravenous contrast. CONTRAST:  130mL ISOVUE-300 IOPAMIDOL (ISOVUE-300) INJECTION 61% COMPARISON:  None. FINDINGS: Lower chest: Lung bases are within normal. Hepatobiliary: Within normal. Pancreas: Within normal. Spleen: Within normal. Adrenals/Urinary Tract: Adrenal glands are normal. Kidneys normal in size without hydronephrosis or nephrolithiasis. There is a 1.2 cm cyst over the mid to lower pole right kidney. Ureters and bladder are normal. Stomach/Bowel: Stomach and small bowel are within normal. Appendix is normal. Minimal diverticulosis of the colon. Vascular/Lymphatic: Minimal calcified plaque over the abdominal aorta. There is a prominent heterogeneous solid and cystic mass with subtle areas of calcification over the left superficial inguinal region. This likely represents adenopathy and measures 8.1 cm in diameter. There are adjacent similar appearing smaller left inguinal lymph nodes. There are similar low-density partially calcified left iliac chain lymph nodes with the largest measuring 6.2 cm in greatest diameter. There is adenopathy in the distal left periaortic region with largest node measuring 2.3 cm by short axis. Reproductive: Within normal. Other: Mild subcutaneous edema over the left inguinal region and left upper leg. Musculoskeletal: Within normal. IMPRESSION: Findings  compatible with significant heterogeneous left inguinal and left iliac chain adenopathy with lesser adenopathy in the periaortic region. Mild associated subcutaneous edema over the left inguinal region and upper leg. Findings are likely due to a neoplastic process, although an infectious/granulomatous process is not excluded. Recommend clinical correlation and diagnostic tissue sampling for further evaluation. 1.2 cm right renal cyst. Mild colonic diverticulosis. Aortic Atherosclerosis (ICD10-I70.0). Electronically Signed   By: Marin Olp M.D.   On: 11/17/2016 17:25    Scheduled Meds: . heparin  5,000 Units Subcutaneous Q8H      Time spent: 25 min  [facility] Pager [phone]. If 7PM-7AM, please contact night-coverage at [email or web address], Office  [phone]  password [email or web address]  11/18/2016, 6:55 PM  LOS: 0 days

## 2016-11-18 NOTE — Progress Notes (Signed)
IR aware of request for biopsy.  Will review case with MD and work towards procedure, if approved, early next week.  If patient to discharge over the weekend, please place outpatient order for biopsy.   Brynda Greathouse, MS RD PA-C

## 2016-11-18 NOTE — Consult Note (Signed)
Urology Consult Note    Requesting Attending Physician:  Oswald Hillock, MD Service Requesting Consult:  Hospitalist Service Providing Consult: Urology  Consulting Attending: Dr. Nicolette Bang   Reason for Consult:  penile lesion, inguinal adenopathy  Ronald Wilson is seen in consultation for reasons noted above at the request of Oswald Hillock, MD on the Hospitalist service.  This is a 41 y.o. yo uncirucmcised patient. Patient initially presented to the ED in September 2017 with penile swelling and ?balanitis. He was told to follow up with urology, but did not. He then presented in early July 2018 with left lower extremity edema. DVT was suspected, and an venous duplex was obtained. This was negative for clot, however an incidentally found 3cm enlarged left inguinal lymph node was noted. Vasculatrity was noted to this inguinal lymph node. He again did not follow up. Finally, he presented yesterday with ongoing severe lower extremity swelling, enlarging left painless inguinal adenopathy and a penile ulcer. Urology and ID were consulted and patient was admitted for expedited workup   STD panel (HIV, RPR, Gc/chlamydia) negative. CT abdomen pelvis demonstrated prominent heterogeneous solid and cystic mass with subtle areas of calcification over the left superficial inguinal region representing adenopathy and measuring 8.1 cm in diameter. There are adjacent similar appearing smaller left inguinal lymphnodes. There are similar low-density partially calcified left iliac chain lymph nodes with the largest measuring 6.2 cm in greatest diameter. There is adenopathy in the distal left periaortic region with largest node measuring 2.3 cm by short axis.  Patient's states he first noted left inguinal swelling around 6 month ago. This was followed by a penile lesion which began after the fact about 3 months ago. Denies pain to both regions. Patient is uncircumcised, retracts foreskin daily. 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 Denies weight loss, bone pain, fevers chills. Endorses gross hematuria since manipulation of the penile lesions. Denies dysuria, frequency, feeling of incomplete emptying.   Saw a urologist many years ago for unknown reason. Patient doesn't remember why.    Past Medical History: Past Medical History:  Diagnosis Date  . Inguinal mass 11/17/2016   left  . Sickle cell trait [facility])     Past Surgical History:  Past Surgical History:  Procedure Laterality Date  . NO PAST SURGERIES      Medication: Current Facility-Administered Medications  Medication Dose Route Frequency Provider Last Rate Last Dose  . heparin injection 5,000 Units  5,000 Units Subcutaneous Q8H Reubin Milan, MD   5,000 Units at 11/18/16 0541  . ketorolac (TORADOL) 30 MG/ML injection 30 mg  30 mg Intravenous Q6H PRN Reubin Milan, MD      . nicotine (NICODERM CQ - dosed in mg/24 hours) patch 21 mg  21 mg Transdermal Daily PRN Reubin Milan, MD      . potassium chloride SA (K-DUR,KLOR-CON) CR tablet 40 mEq  40 mEq Oral Once Oswald Hillock, MD        Allergies: No Known Allergies  Social History: Social History  Substance Use Topics  . Smoking status: Current Every Day Smoker    Packs/day: 0.25    Years: 25.00    Types: Cigarettes  . Smokeless tobacco: Never Used  . Alcohol use 9.0 oz/week    15 Shots of liquor per week     Comment: "11/17/2016 "I'll have shots ~ 3 days/wk"  Family History Family History  Problem Relation Age of Onset  . Hypertension Mother     Review of Systems 10 systems were reviewed and are negative except as noted specifically in the HPI.  Objective   Vital signs in last 24 hours: BP (!) 175/89 (BP Location: Right Arm)   Pulse 67   Temp 98.6 F (37 C) (Oral)   Resp 18   Ht 5\' 9"  (1.753 m)   Wt 91.1 kg (200 lb 12.8 oz)   SpO2 100%   BMI  29.65 kg/m   Intake/Output last 3 shifts: No intake/output data recorded.  Physical Exam General: NAD, A&O, resting, appropriate HEENT: [place]/AT, EOMI, MMM Pulmonary: Normal work of breathing on room air Cardiovascular: HDS, adequate peripheral perfusion Abdomen: soft, NTTP, nondistended, no suprapubic fullness or tenderness Groin: Significantly enlarged left inguinal adenopathy. A palpable mass around 7cm is easily palpate in the left inguinal canal. It is fixed, non mobile, non tender. Some small fixed right inguinal adenopathy is also appreciated deep in the inguinal canal .  GU: Uncircumcised male phallus. 3cm hard fixed glanular lesions, light pink, involving the right hemiglans, appearing to course into the urethra. Serosanguinous fluid emanating from lesion with palpation.     Extremities: warm and well perfused, left lower extremity pitting edema to the mid thigh, increased erythema of left lower  Neuro: Appropriate, no focal neurological deficits  Most Recent Labs: Lab Results  Component Value Date   WBC 9.2 11/18/2016   HGB 10.8 (L) 11/18/2016   HCT 32.1 (L) 11/18/2016   PLT 353 11/18/2016    Lab Results  Component Value Date   NA 142 11/18/2016   K 3.2 (L) 11/18/2016   CL 106 11/18/2016   CO2 29 11/18/2016   BUN 7 11/18/2016   CREATININE 1.17 11/18/2016   CALCIUM 8.6 (L) 11/18/2016    Lab Results  Component Value Date   ALKPHOS 64 11/18/2016   BILITOT 0.3 11/18/2016   PROT 6.5 11/18/2016   ALBUMIN 3.1 (L) 11/18/2016   ALT 17 11/18/2016   AST 21 11/18/2016    No results found for: INR, APTT   Urine Culture: None    IMAGING: Ct Abdomen Pelvis W Contrast  Result Date: 11/17/2016 CLINICAL DATA:  Left lower quadrant and groin pain. EXAM: CT ABDOMEN AND PELVIS WITH CONTRAST TECHNIQUE: Multidetector CT imaging of the abdomen and pelvis was performed using the standard protocol following bolus administration of intravenous contrast. CONTRAST:  123mL  ISOVUE-300 IOPAMIDOL (ISOVUE-300) INJECTION 61% COMPARISON:  None. FINDINGS: Lower chest: Lung bases are within normal. Hepatobiliary: Within normal. Pancreas: Within normal. Spleen: Within normal. Adrenals/Urinary Tract: Adrenal glands are normal. Kidneys normal in size without hydronephrosis or nephrolithiasis. There is a 1.2 cm cyst over the mid to lower pole right kidney. Ureters and bladder are normal. Stomach/Bowel: Stomach and small bowel are within normal. Appendix is normal. Minimal diverticulosis of the colon. Vascular/Lymphatic: Minimal calcified plaque over the abdominal aorta. There is a prominent heterogeneous solid and cystic mass with subtle areas of calcification over the left superficial inguinal region. This likely represents adenopathy and measures 8.1 cm in diameter. There are adjacent similar appearing smaller left inguinal lymph nodes. There are similar low-density partially calcified left iliac chain lymph nodes with the largest measuring 6.2 cm in greatest diameter. There is adenopathy in the distal left periaortic region with largest node measuring 2.3 cm by short axis. Reproductive: Within normal. Other: Mild subcutaneous edema over the left inguinal region and left upper leg.  Musculoskeletal: Within normal. IMPRESSION: Findings compatible with significant heterogeneous left inguinal and left iliac chain adenopathy with lesser adenopathy in the periaortic region. Mild associated subcutaneous edema over the left inguinal region and upper leg. Findings are likely due to a neoplastic process, although an infectious/granulomatous process is not excluded. Recommend clinical correlation and diagnostic tissue sampling for further evaluation. 1.2 cm right renal cyst. Mild colonic diverticulosis. Aortic Atherosclerosis (ICD10-I70.0). Electronically Signed   By: Marin Olp M.D.   On: 11/17/2016 17:25    ------  Assessment:  Patient is a 41 y.o. male with 3cm glans penis mass, in addition  to a fixed, painless, large left superficial inguinal mass. CT imaging demonstrates severe left superficial, deep inguinal chain, and pelvic likely necrotic adenopathy. The lefty pelvic lymph nodes appear to be encircling his left iliac vessels, likely causing his left lower extremity edema and discomfort. These findings are significantly concerning for malignancy, specifically concerning for metastatic carcinoma of the penis. If biopsy proves  pelvic adenopathy, this would automatically determine a diagnosis of stage IV disease, with an unfortunately poor prognosis.   My significant concern for malignancy was communicated to the patient today. We discussed next steps, which include percutaneous biopsy of lymphadenopathy for diagnosis and staging. Will discuss case with [facility]IR and hope to obtain diagnosis while patient hospitalized due to poor follow up history. Will hope to have biopsy performed of left pelvic lymph node as this would most definitivly provide [place] with prognosis and staging information were it to be positive for malignancy.   Recommendations: 1. PVR to ensure bladder emptying 2. Obtain tissue diagnosis via image guided percutaneous pelvic lymph node biopsy  3. Obtain coags in preparation for biopsy  Thank you for this consult. Please contact the urology consult pager with any further questions/concerns.  Jonna Clark, MD Urology Surgical Resident  -----

## 2016-11-18 NOTE — Progress Notes (Signed)
      INFECTIOUS DISEASE ATTENDING ADDENDUM:   Date: 11/18/2016  Patient name: Ronald Wilson  Medical record number: [id number]  Date of birth: 23-Jan-1976    This patient has been seen and discussed with the house staff. Please see the resident's note for complete details. I concur with their findings with the following additions/corrections: I have reviewed past medical history past surgical history family history social history review of systems medications allergies and pertinent laboratory and radiographic data.  In short this is a 41 year old African-American man with a several month history of a penile ulcer and significant inguinal lymphadenopathy with edema of entire leg consequentially.   He has been treated at the health department and worked up without a clear-cut diagnosis. He was seen in 2017 with severe edema of his entire penis which then improved.  His labs here are unremarkable so far in terms of workup for STI's with negative gonorrhea and chlamydia from urine and a negative RPR though I don't know if prozone was checked for. He has been given 2.4 million units of penicillin intramuscularly.  I am however very skeptical that this is syphilis or this is a sexual transmitted infection.  See my for those below from today's exam.  11/18/16:       I have significant concerns that he is suffering from a malignancy potentially penile cancer. I have recommended consultation with urology and general surgery for biopsy of the penile lesion as well as his inguinal lymph node. With regards to the inguinal lymph node I would recommend excisional lymph node biopsy so the entire lymph node can be sent for various tests including cultures for bacteria AFB and fungi. Can also assay for organisms via PCR autoimmune conditions such as Bichat's are possible but seem unlikely I think unfortunately malignancy is high on the differential   Alcide Evener 11/18/2016, 2:37 PM

## 2016-11-18 NOTE — Consult Note (Signed)
[facility] for Infectious Disease    Date of Admission:  11/17/2016   Total days of antibiotics 1         Penicillin IM               Reason for Consult: Penile lesion    Referring Provider: Dr. Olevia Bowens   Assessment and Plan:   Penile Lesion Inguinal Mass The chronic course, painless nature and the amount of adenopathy noted makes malignancy a likely cause. CT of abdomen and pelvis revealed left inguinal adenopathy along with subcutaneous edema over left inguinal region that are likely to be due to a neoplastic process.   HSV,ducreyi, behcet, and syphilis all present with ulcerations. RPR was non-reactive and GC/Chlamydia results are pending.   Based on the nature of the penile lesion and accompanied inguinal lymphadenopathy there is high suspicion for malignancy. Possible squamous cell carcinoma.   -Excisional lymph node biopsy- sent for culture (bacteria, fungi, AFB). Will treat per the results. -coag studies for biopsy  Unilateral edema of the left lower extremity The patient does not have a dvt per US of the left lower extremity (8/9) which did not show any evidence of deep or superficial venous thrombosis. The patient's swelling is likely synchronous with the inguinal adenopathy. -Biopsy of the inguinal adenoma   Principal Problem:   Inguinal mass Active Problems:   Ulcer of penis   Anemia   Unilateral edema of left lower extremity   Tobacco use disorder   . heparin  5,000 Units Subcutaneous Q8H  . potassium chloride  40 mEq Oral Once    HPI: Ronald Wilson is a 41 y.o. male with no pmh presenting for left leg swelling for the past month. The patient stated that the leg swelling is accompanied with 6/10 intensity pain upon palpation, but is not accompanied by any change in rom, numbness/tingling. The patient has also noticed erythema and ulceration on his penis for the past few months along with swelling in his groin. Both the ulceration and the swelling  have not been painful. The penis has been bleeding, but no discharge has been noted.   The patient recalls swelling in his entire penis in 2017. Earlier this year he was seen at a free clinic for presumed balanitis and referred to urology. The patient did not follow up with urology due to financial restraints. The patient believes he was treated with azithromycin.  The patient states that he only has sex with females and his last sexual encounter was approximately a week ago. The patient states that he has been using protection every time since he has noticed this condition.   During this admission the patient was treated with IM penicillin. RPR, urine gonorrhea an chlamydia, and US of the left lower extremity were ordered.    Review of Systems: Review of Systems  Constitutional: Negative for fever.  Gastrointestinal: Negative for abdominal pain.  Genitourinary: Negative for dysuria and hematuria.     Past Medical History:  Diagnosis Date  . Inguinal mass 11/17/2016   left  . Sickle cell trait [facility])     Social History  Substance Use Topics  . Smoking status: Current Every Day Smoker    Packs/day: 0.25    Years: 25.00    Types: Cigarettes  . Smokeless tobacco: Never Used  . Alcohol use 9.0 oz/week    15 Shots of liquor per week     Comment: "11/17/2016 "I'll have shots ~ 3  days/wk"    Family History  Problem Relation Age of Onset  . Hypertension Mother    No Known Allergies  OBJECTIVE: Blood pressure (!) 175/89, pulse 67, temperature 98.6 F (37 C), temperature source Oral, resp. rate 18, height 5\' 9"  (1.753 m), weight 200 lb 12.8 oz (91.1 kg), SpO2 100 %.  Physical Exam  Constitutional: He is well-developed, well-nourished, and in no distress.  HENT:  Head: Normocephalic and atraumatic.  Eyes: Conjunctivae are normal.  Cardiovascular: Normal rate, regular rhythm, normal heart sounds and intact distal pulses.   Pulmonary/Chest: Effort normal and breath sounds normal.  No respiratory distress. He has no wheezes. He has no rales.  Abdominal: Soft. Bowel sounds are normal. He exhibits no distension.  Genitourinary: Penis exhibits lesions. Bloody and discharge found.  Genitourinary Comments: Large mass approximately 5x5cm in size located on the groin  Musculoskeletal:  Left leg swelling from hip to calf.     Lab Results Lab Results  Component Value Date   WBC 9.2 11/18/2016   HGB 10.8 (L) 11/18/2016   HCT 32.1 (L) 11/18/2016   MCV 83.8 11/18/2016   PLT 353 11/18/2016    Lab Results  Component Value Date   CREATININE 1.17 11/18/2016   BUN 7 11/18/2016   NA 142 11/18/2016   K 3.2 (L) 11/18/2016   CL 106 11/18/2016   CO2 29 11/18/2016    Lab Results  Component Value Date   ALT 17 11/18/2016   AST 21 11/18/2016   ALKPHOS 64 11/18/2016   BILITOT 0.3 11/18/2016     Microbiology: No results found for this or any previous visit (from the past 240 hour(s)).  Lars Mage, [place] for [facility] [phone] pager   [phone] cell 11/18/2016, 9:55 AM

## 2016-11-19 DIAGNOSIS — N485 Ulcer of penis: Secondary | ICD-10-CM | POA: Diagnosis present

## 2016-11-19 DIAGNOSIS — I10 Essential (primary) hypertension: Secondary | ICD-10-CM | POA: Diagnosis not present

## 2016-11-19 DIAGNOSIS — Z8249 Family history of ischemic heart disease and other diseases of the circulatory system: Secondary | ICD-10-CM | POA: Diagnosis not present

## 2016-11-19 DIAGNOSIS — A539 Syphilis, unspecified: Secondary | ICD-10-CM | POA: Diagnosis present

## 2016-11-19 DIAGNOSIS — R600 Localized edema: Secondary | ICD-10-CM | POA: Diagnosis present

## 2016-11-19 DIAGNOSIS — F17210 Nicotine dependence, cigarettes, uncomplicated: Secondary | ICD-10-CM | POA: Diagnosis present

## 2016-11-19 DIAGNOSIS — D573 Sickle-cell trait: Secondary | ICD-10-CM | POA: Diagnosis present

## 2016-11-19 DIAGNOSIS — E876 Hypokalemia: Secondary | ICD-10-CM | POA: Diagnosis present

## 2016-11-19 DIAGNOSIS — R590 Localized enlarged lymph nodes: Secondary | ICD-10-CM | POA: Diagnosis present

## 2016-11-19 LAB — BASIC METABOLIC PANEL
Anion gap: 6 (ref 5–15)
BUN: 6 mg/dL (ref 6–20)
CHLORIDE: 105 mmol/L (ref 101–111)
CO2: 30 mmol/L (ref 22–32)
Calcium: 8.9 mg/dL (ref 8.9–10.3)
Creatinine, Ser: 1.13 mg/dL (ref 0.61–1.24)
GFR calc Af Amer: >60 mL/min (ref >60)
GFR calc non Af Amer: >60 mL/min (ref >60)
GLUCOSE: 113 mg/dL — AB (ref 65–99)
POTASSIUM: 4 mmol/L (ref 3.5–5.1)
Sodium: 141 mmol/L (ref 135–145)

## 2016-11-19 LAB — RPR: RPR Ser Ql: NONREACTIVE

## 2016-11-19 MED ORDER — HEPARIN SODIUM (PORCINE) 1000 UNIT/ML IJ SOLN: 5000.0000 [IU] | Freq: Three times a day (TID) | INTRAMUSCULAR | Status: DC

## 2016-11-19 NOTE — Consult Note (Signed)
Chief Complaint: Patient was seen in consultation today for left inguinal lymph node biopsy Chief Complaint  Patient presents with  . Leg Swelling   at the request of Ronald,Joshua Wilson  Referring Physician(s): Margette Fast  Supervising Physician: Ronald Wilson  Patient Status: [facility] - In-pt  History of Present Illness: Ronald Wilson is a 41 y.o. male   Penile ulcer Inguinal lymphadenopathy Left leg edema All STD cxs negative CT 8/9: IMPRESSION: Findings compatible with significant heterogeneous left inguinal and left iliac chain adenopathy with lesser adenopathy in the periaortic region. Mild associated subcutaneous edema over the left inguinal region and upper leg. Findings are likely due to a neoplastic process, although an infectious/granulomatous process is not excluded. Recommend clinical correlation and diagnostic tissue sampling for further evaluation. 1.2 cm right renal cyst. Mild colonic diverticulosis.  Request made for inguinal lymphadenopathy biopsy Dr Barbie Banner has reviewed imaging and approves procedure   Past Medical History:  Diagnosis Date  . Inguinal mass 11/17/2016   left  . Sickle cell trait [facility])     Past Surgical History:  Procedure Laterality Date  . NO PAST SURGERIES      Allergies: Patient has no known allergies.  Medications: Prior to Admission medications   Medication Sig Start Date End Date Taking? Authorizing Provider  HYDROcodone-acetaminophen (NORCO/VICODIN) 5-325 MG tablet Take 1 tablet by mouth every 6 (six) hours as needed for moderate pain. Patient not taking: Reported on 11/17/2016 01/09/16   Dalia Heading, PA-C  ibuprofen (ADVIL,MOTRIN) 800 MG tablet Take 1 tablet (800 mg total) by mouth every 8 (eight) hours as needed. Patient not taking: Reported on 11/17/2016 01/09/16   Dalia Heading, PA-C     Family History  Problem Relation Age of Onset  . Hypertension Mother     Social History   Social History  .  Marital status: Single    Spouse name: Ronald Wilson  . Number of children: Ronald Wilson  . Years of education: Ronald Wilson   Social History Main Topics  . Smoking status: Current Every Day Smoker    Packs/day: 0.25    Years: 25.00    Types: Cigarettes  . Smokeless tobacco: Never Used  . Alcohol use 9.0 oz/week    15 Shots of liquor per week     Comment: "11/17/2016 "I'll have shots ~ 3 days/wk"  . Drug use: Yes    Types: Marijuana     Comment: 11/17/2016 "daily"  . Sexual activity: Yes   Other Topics Concern  . None   Social History Narrative  . None    Review of Systems: A 12 point ROS discussed and pertinent positives are indicated in the HPI above.  All other systems are negative.  Review of Systems  Constitutional: Negative for activity change, appetite change, fatigue and fever.  Respiratory: Negative for shortness of breath.   Cardiovascular: Negative for chest pain.  Gastrointestinal: Negative for abdominal pain.  Genitourinary: Positive for penile pain, penile swelling and scrotal swelling. Negative for discharge.  Psychiatric/Behavioral: Negative for behavioral problems and confusion.    Vital Signs: BP (!) 157/95 (BP Location: Right Arm)   Pulse 80   Temp 99 F (37.2 C) (Oral)   Resp 18   Ht 5\' 9"  (1.753 m)   Wt 200 lb 12.8 oz (91.1 kg)   SpO2 98%   BMI 29.65 kg/m   Physical Exam  Constitutional: He is oriented to person, place, and time.  Cardiovascular: Normal rate, regular rhythm and normal heart sounds.   Pulmonary/Chest:  Effort normal and breath sounds normal.  Abdominal: Soft. Bowel sounds are normal.  Musculoskeletal: Normal range of motion.  Left leg edema  Neurological: He is alert and oriented to person, place, and time.  Skin: Skin is warm and dry.  Left inguinal LN enlargement/palpable; visible  Psychiatric: He has a normal mood and affect. His behavior is normal. Judgment and thought content normal.  Nursing note and vitals reviewed.   Mallampati Score:  MD  Evaluation Airway: WNL Heart: WNL Abdomen: WNL Chest/ Lungs: WNL ASA  Classification: 3 Mallampati/Airway Score: One  Imaging: Ct Abdomen Pelvis W Contrast  Result Date: 11/17/2016 CLINICAL DATA:  Left lower quadrant and groin pain. EXAM: CT ABDOMEN AND PELVIS WITH CONTRAST TECHNIQUE: Multidetector CT imaging of the abdomen and pelvis was performed using the standard protocol following bolus administration of intravenous contrast. CONTRAST:  140mL ISOVUE-300 IOPAMIDOL (ISOVUE-300) INJECTION 61% COMPARISON:  None. FINDINGS: Lower chest: Lung bases are within normal. Hepatobiliary: Within normal. Pancreas: Within normal. Spleen: Within normal. Adrenals/Urinary Tract: Adrenal glands are normal. Kidneys normal in size without hydronephrosis or nephrolithiasis. There is a 1.2 cm cyst over the mid to lower pole right kidney. Ureters and bladder are normal. Stomach/Bowel: Stomach and small bowel are within normal. Appendix is normal. Minimal diverticulosis of the colon. Vascular/Lymphatic: Minimal calcified plaque over the abdominal aorta. There is a prominent heterogeneous solid and cystic mass with subtle areas of calcification over the left superficial inguinal region. This likely represents adenopathy and measures 8.1 cm in diameter. There are adjacent similar appearing smaller left inguinal lymph nodes. There are similar low-density partially calcified left iliac chain lymph nodes with the largest measuring 6.2 cm in greatest diameter. There is adenopathy in the distal left periaortic region with largest node measuring 2.3 cm by short axis. Reproductive: Within normal. Other: Mild subcutaneous edema over the left inguinal region and left upper leg. Musculoskeletal: Within normal. IMPRESSION: Findings compatible with significant heterogeneous left inguinal and left iliac chain adenopathy with lesser adenopathy in the periaortic region. Mild associated subcutaneous edema over the left inguinal region and upper  leg. Findings are likely due to a neoplastic process, although an infectious/granulomatous process is not excluded. Recommend clinical correlation and diagnostic tissue sampling for further evaluation. 1.2 cm right renal cyst. Mild colonic diverticulosis. Aortic Atherosclerosis (ICD10-I70.0). Electronically Signed   By: Marin Olp M.D.   On: 11/17/2016 17:25    Labs:  CBC:  Recent Labs  11/17/16 1233 11/18/16 0326  WBC 11.3* 9.2  HGB 10.7* 10.8*  HCT 32.4* 32.1*  PLT 358 353    COAGS: No results for input(s): INR, APTT in the last 8760 hours.  BMP:  Recent Labs  11/17/16 1233 11/18/16 0326 11/19/16 0405  NA 140 142 141  K 3.5 3.2* 4.0  CL 104 106 105  CO2 30 29 30   GLUCOSE 103* 84 113*  BUN 11 7 6   CALCIUM 9.4 8.6* 8.9  CREATININE 1.15 1.17 1.13  GFRNONAA >60 >60 >60  GFRAA >60 >60 >60    LIVER FUNCTION TESTS:  Recent Labs  11/18/16 0326  BILITOT 0.3  AST 21  ALT 17  ALKPHOS 64  PROT 6.5  ALBUMIN 3.1*    TUMOR MARKERS: No results for input(s): AFPTM, CEA, CA199, CHROMGRNA in the last 8760 hours.  Assessment and Plan:  Penile lesion L inguinal LN enlargement Scheduled for LAN bx 8/13 in Radiology (if pt is discharged over weekend----IR would need to be notified; would need OP order; will try to  schedule as OP for Mon if can) Risks and benefits discussed with the patient including, but not limited to bleeding, infection, damage to adjacent structures or low yield requiring additional tests. All of the patient's questions were answered, patient is agreeable to proceed. Consent signed and in chart.   Thank you for this interesting consult.  I greatly enjoyed meeting Domani Bakos and look forward to participating in their care.  A copy of this report was sent to the requesting provider on this date.  Electronically Signed: Lavonia Drafts, PA-C 11/19/2016, 8:32 AM   I spent a total of 40 Minutes    in face to face in clinical consultation,  greater than 50% of which was counseling/coordinating care for left inguinal LAN biopsy

## 2016-11-19 NOTE — Progress Notes (Signed)
[facility]  PROGRESS NOTE  Ronald Wilson TUU:[id number] DOB: 1975/05/04 DOA: 11/17/2016 PCP: Patient, No Pcp Per   Brief HPI:   41 y.o. male without medical history who is coming to the ED with complaints of several months of progressively worse LLE edema for several months. He was seen last month seen in the [facility], had [place] of lower extremity which was negative for DVT. He was seen for balanitis in October of last year by Dr. Audie Pinto. At that time, he was referred to urology, but has not followed up. He stated that he is heterosexual, but has not had sexual contact since he developed this condition.     Subjective   Patient seen and examined, denies pain.   Assessment/Plan:     1. Inguinal mass- patient has large left inguinal lymph node, Urology has been consulted. Plan for lymph node biopsy on Monday per IR. 2. Penile mass- urology following. Possible malignancy. 3. Hypokalemia-replete. 4. ? STI- patient was empirically given benzathine penicillin G 2.4 million units intramuscularly for possible syphilis. All the labs are negative for STI. RPR is negative, including Chlamydia from urine negative. ID following.    DVT prophylaxis: Heparin  Code Status: Full code  Family Communication: No family at bedside   Disposition Plan: Likely home in next 3-4 days   Consultants:  None  Procedures:  None   Continuous infusions     Antibiotics:   Anti-infectives    Start     Dose/Rate Route Frequency Ordered Stop   11/17/16 2100  penicillin g benzathine (BICILLIN LA) 1200000 UNIT/2ML injection 2.4 Million Units     2.4 Million Units Intramuscular  Once 11/17/16 2056 11/17/16 2112       Objective   Vitals:   11/18/16 1429 11/18/16 2157 11/19/16 0519 11/19/16 1439  BP: (!) 154/96 (!) 172/95 (!) 157/95 (!) 174/102  Pulse:  74 80 72  Resp:  18 18 18   Temp:  98.5 F (36.9 C) 99 F (37.2 C) 98.8 F (37.1 C)  TempSrc:  Oral Oral   SpO2:  100% 98% 96%  Weight:       Height:        Intake/Output Summary (Last 24 hours) at 11/19/16 1509 Last data filed at 11/19/16 1300  Gross per 24 hour  Intake              806 ml  Output             1150 ml  Net             -344 ml   Filed Weights   11/17/16 1223 11/17/16 2150 11/17/16 2151  Weight: 88 kg (194 lb) 91.5 kg (201 lb 11.2 oz) 91.1 kg (200 lb 12.8 oz)     Physical Examination:   Physical Exam: Eyes: No icterus, extraocular muscles intact  Mouth: Oral mucosa is moist, no lesions on palate,  Neck: Supple, no deformities, masses, or tenderness Lungs: Normal respiratory effort, bilateral clear to auscultation, no crackles or wheezes.  Heart: Regular rate and rhythm, S1 and S2 normal, no murmurs, rubs auscultated Abdomen: Large fixed lymph node palpated in the left inguinal region, nontender to palpation Extremities: Left lower extremity edema Neuro : Alert and oriented to time, place and person, No focal deficits      Data Reviewed: I have personally reviewed following labs and imaging studies  CBG: No results for input(s): GLUCAP in the last 168 hours.  CBC:  Recent Labs Lab 11/17/16 1233  11/18/16 0326  WBC 11.3* 9.2  NEUTROABS 8.2* 6.3  HGB 10.7* 10.8*  HCT 32.4* 32.1*  MCV 82.4 83.8  PLT 358 263    Basic Metabolic Panel:  Recent Labs Lab 11/17/16 1233 11/18/16 0326 11/19/16 0405  NA 140 142 141  K 3.5 3.2* 4.0  CL 104 106 105  CO2 30 29 30   GLUCOSE 103* 84 113*  BUN 11 7 6   CREATININE 1.15 1.17 1.13  CALCIUM 9.4 8.6* 8.9    No results found for this or any previous visit (from the past 240 hour(s)).   Liver Function Tests:  Recent Labs Lab 11/18/16 0326  AST 21  ALT 17  ALKPHOS 64  BILITOT 0.3  PROT 6.5  ALBUMIN 3.1*   No results for input(s): LIPASE, AMYLASE in the last 168 hours. No results for input(s): AMMONIA in the last 168 hours.  Cardiac Enzymes: No results for input(s): CKTOTAL, CKMB, CKMBINDEX, TROPONINI in the last 168 hours. BNP  (last 3 results)  Recent Labs  11/17/16 1247  BNP 32.1    ProBNP (last 3 results) No results for input(s): PROBNP in the last 8760 hours.    Studies: Ct Abdomen Pelvis W Contrast  Result Date: 11/17/2016 CLINICAL DATA:  Left lower quadrant and groin pain. EXAM: CT ABDOMEN AND PELVIS WITH CONTRAST TECHNIQUE: Multidetector CT imaging of the abdomen and pelvis was performed using the standard protocol following bolus administration of intravenous contrast. CONTRAST:  122mL ISOVUE-300 IOPAMIDOL (ISOVUE-300) INJECTION 61% COMPARISON:  None. FINDINGS: Lower chest: Lung bases are within normal. Hepatobiliary: Within normal. Pancreas: Within normal. Spleen: Within normal. Adrenals/Urinary Tract: Adrenal glands are normal. Kidneys normal in size without hydronephrosis or nephrolithiasis. There is a 1.2 cm cyst over the mid to lower pole right kidney. Ureters and bladder are normal. Stomach/Bowel: Stomach and small bowel are within normal. Appendix is normal. Minimal diverticulosis of the colon. Vascular/Lymphatic: Minimal calcified plaque over the abdominal aorta. There is a prominent heterogeneous solid and cystic mass with subtle areas of calcification over the left superficial inguinal region. This likely represents adenopathy and measures 8.1 cm in diameter. There are adjacent similar appearing smaller left inguinal lymph nodes. There are similar low-density partially calcified left iliac chain lymph nodes with the largest measuring 6.2 cm in greatest diameter. There is adenopathy in the distal left periaortic region with largest node measuring 2.3 cm by short axis. Reproductive: Within normal. Other: Mild subcutaneous edema over the left inguinal region and left upper leg. Musculoskeletal: Within normal. IMPRESSION: Findings compatible with significant heterogeneous left inguinal and left iliac chain adenopathy with lesser adenopathy in the periaortic region. Mild associated subcutaneous edema over the  left inguinal region and upper leg. Findings are likely due to a neoplastic process, although an infectious/granulomatous process is not excluded. Recommend clinical correlation and diagnostic tissue sampling for further evaluation. 1.2 cm right renal cyst. Mild colonic diverticulosis. Aortic Atherosclerosis (ICD10-I70.0). Electronically Signed   By: Marin Olp M.D.   On: 11/17/2016 17:25    Scheduled Meds: . heparin  5,000 Units Subcutaneous Q8H  . [START ON 11/22/2016] heparin  5,000 Units Intravenous Q8H      Time spent: 25 min  [facility] Pager [phone]. If 7PM-7AM, please contact night-coverage at [email or web address], Office  [phone]  password [email or web address]  11/19/2016, 3:09 PM  LOS: 0 days

## 2016-11-19 NOTE — Treatment Plan (Signed)
Attempted to contact IR for planning for procedure Monday.  Requesting PELVIC lymaphdenopathy biopsy, not superficial inguinal. Most helpful for staging to pursue pelvic node (palpable on exam inferior to pubic bone).  Will contact again tomorrow and Monday.  Jonna Clark, MD [facility]

## 2016-11-20 NOTE — Progress Notes (Signed)
LN Bx is scheduled for tomorrow. Intrapelvic LN (rather than left inguinal) is specifically requested by Dr. Shanon Brow for proper staging. We will target the left external iliac LN on image #70 of series 3 on the recent CT scan.

## 2016-11-20 NOTE — Progress Notes (Signed)
Patient ID: Ronald Wilson, male   DOB: 02-14-1976, 41 y.o.   MRN: [id number]   Pt is scheduled for left pelvic LN Bx in am Dr Shanon Brow has discussed with Dr Barbie Banner Requesting specific LN for biopsy  Plan for Left external iliac lymph node biopsy Image 64 Series 3 per Dr Barbie Banner  Pt aware

## 2016-11-20 NOTE — Progress Notes (Signed)
[facility]  PROGRESS NOTE  Raihan Kimmel TOI:[id number] DOB: 29-Jul-1975 DOA: 11/17/2016 PCP: Patient, No Pcp Per   Brief HPI:   41 y.o. male without medical history who is coming to the ED with complaints of several months of progressively worse LLE edema for several months. He was seen last month seen in the [facility], had [place] of lower extremity which was negative for DVT. He was seen for balanitis in October of last year by Dr. Audie Pinto. At that time, he was referred to urology, but has not followed up. He stated that he is heterosexual, but has not had sexual contact since he developed this condition.     Subjective   Patient seen and examined, denies pain.   Assessment/Plan:     1. Inguinal mass- patient has large left inguinal lymph node, Urology has been consulted. Plan for lymph node biopsy on Monday per IR. 2. Penile mass- urology following. Possible malignancy. 3. Hypokalemia-replete,  potassium is 4.0. 4. ? STI- patient was empirically given benzathine penicillin G 2.4 million units intramuscularly for possible syphilis. All the labs are negative for STI. RPR is negative, including Chlamydia from urine negative. ID following.    DVT prophylaxis: Heparin  Code Status: Full code  Family Communication: No family at bedside   Disposition Plan: Likely home in next 3-4 days   Consultants:  None  Procedures:  None   Continuous infusions     Antibiotics:   Anti-infectives    Start     Dose/Rate Route Frequency Ordered Stop   11/17/16 2100  penicillin g benzathine (BICILLIN LA) 1200000 UNIT/2ML injection 2.4 Million Units     2.4 Million Units Intramuscular  Once 11/17/16 2056 11/17/16 2112       Objective   Vitals:   11/19/16 2211 11/19/16 2212 11/20/16 0537 11/20/16 1400  BP: (!) 172/100 (!) 166/98 (!) 162/93 (!) 161/92  Pulse: 83 76 72 73  Resp: 18  18 16   Temp: 98.7 F (37.1 C)  98.3 F (36.8 C) 98.4 F (36.9 C)  TempSrc: Oral  Oral Oral   SpO2: 97% 98% 96% 99%  Weight:      Height:        Intake/Output Summary (Last 24 hours) at 11/20/16 1521 Last data filed at 11/20/16 1000  Gross per 24 hour  Intake              220 ml  Output                0 ml  Net              220 ml   Filed Weights   11/17/16 1223 11/17/16 2150 11/17/16 2151  Weight: 88 kg (194 lb) 91.5 kg (201 lb 11.2 oz) 91.1 kg (200 lb 12.8 oz)     Physical Examination:  Physical Exam: Eyes: No icterus, extraocular muscles intact  Mouth: Oral mucosa is moist, no lesions on palate,  Neck: Supple, no deformities, masses, or tenderness Lungs: Normal respiratory effort, bilateral clear to auscultation, no crackles or wheezes.  Heart: Regular rate and rhythm, S1 and S2 normal, no murmurs, rubs auscultated Abdomen: Large fixed lymph node palpated in the left M1 region, nontender to palpation Extremities: No pretibial edema, no erythema, no cyanosis, no clubbing Neuro : Alert and oriented to time, place and person, No focal deficits Skin: No rashes seen on exam      Data Reviewed: I have personally reviewed following labs and imaging studies  CBG:  No results for input(s): GLUCAP in the last 168 hours.  CBC:  Recent Labs Lab 11/17/16 1233 11/18/16 0326  WBC 11.3* 9.2  NEUTROABS 8.2* 6.3  HGB 10.7* 10.8*  HCT 32.4* 32.1*  MCV 82.4 83.8  PLT 358 445    Basic Metabolic Panel:  Recent Labs Lab 11/17/16 1233 11/18/16 0326 11/19/16 0405  NA 140 142 141  K 3.5 3.2* 4.0  CL 104 106 105  CO2 30 29 30   GLUCOSE 103* 84 113*  BUN 11 7 6   CREATININE 1.15 1.17 1.13  CALCIUM 9.4 8.6* 8.9    No results found for this or any previous visit (from the past 240 hour(s)).   Liver Function Tests:  Recent Labs Lab 11/18/16 0326  AST 21  ALT 17  ALKPHOS 64  BILITOT 0.3  PROT 6.5  ALBUMIN 3.1*   No results for input(s): LIPASE, AMYLASE in the last 168 hours. No results for input(s): AMMONIA in the last 168 hours.  Cardiac Enzymes: No  results for input(s): CKTOTAL, CKMB, CKMBINDEX, TROPONINI in the last 168 hours. BNP (last 3 results)  Recent Labs  11/17/16 1247  BNP 32.1    ProBNP (last 3 results) No results for input(s): PROBNP in the last 8760 hours.    Studies: No results found.  Scheduled Meds: . heparin  5,000 Units Subcutaneous Q8H  . [START ON 11/22/2016] heparin  5,000 Units Intravenous Q8H      Time spent: 25 min  [facility] Pager [phone]. If 7PM-7AM, please contact night-coverage at [email or web address], Office  [phone]  password [email or web address]  11/20/2016, 3:21 PM  LOS: 1 day

## 2016-11-21 LAB — PROTIME-INR
INR: 0.98
Prothrombin Time: 12.9 seconds (ref 11.4–15.2)

## 2016-11-21 LAB — APTT: APTT: 31 s (ref 24–36)

## 2016-11-21 LAB — GC/CHLAMYDIA PROBE AMP (~~LOC~~) NOT AT ARMC
CHLAMYDIA, DNA PROBE: NEGATIVE
Neisseria Gonorrhea: NEGATIVE

## 2016-11-21 MED ORDER — HEPARIN SODIUM (PORCINE) 1000 UNIT/ML IJ SOLN
5000.0000 [IU] | Freq: Three times a day (TID) | INTRAMUSCULAR | Status: DC
  Filled 2016-11-21: qty 5

## 2016-11-21 MED ORDER — AMLODIPINE BESYLATE 10 MG PO TABS
10.0000 mg | ORAL_TABLET | Freq: Every day | ORAL | Status: DC
  Administered 2016-11-21 – 2016-11-23 (×3): 10 mg via ORAL
  Filled 2016-11-21 (×3): qty 1

## 2016-11-21 NOTE — Progress Notes (Addendum)
[facility]  PROGRESS NOTE  Ronald Wilson WHQ:[id number] DOB: 11-04-1975 DOA: 11/17/2016 PCP: Patient, No Pcp Per   Brief HPI:   41 y.o. male without medical history who is coming to the ED with complaints of several months of progressively worse LLE edema for several months. He was seen last month seen in the [facility], had [place] of lower extremity which was negative for DVT. He was seen for balanitis in October of last year by Dr. Audie Pinto. At that time, he was referred to urology, but has not followed up. He stated that he is heterosexual, but has not had sexual contact since he developed this condition.     Subjective    patient seen and examined, blood pressure is elevated. Denies any pain.   Assessment/Plan:     1. Inguinal mass- patient has large left inguinal lymph node, Urology has been consulted. Plan for lymph node biopsy tomorrow morning. 2. Penile mass- urology following. Possible malignancy. 3.  Hypertension- blood pressure is elevated, will start amlodipine 10 mg by mouth daily. 4. Hypokalemia-replete,  potassium is 4.0. 5. ? STI- patient was empirically given benzathine penicillin G 2.4 million units intramuscularly for possible syphilis. All the labs are negative for STI. RPR is negative, including Chlamydia from urine negative. ID following.    DVT prophylaxis: Heparin  Code Status: Full code  Family Communication: No family at bedside   Disposition Plan: Likely home in next 3-4 days   Consultants:  None  Procedures:  None   Continuous infusions     Antibiotics:   Anti-infectives    Start     Dose/Rate Route Frequency Ordered Stop   11/17/16 2100  penicillin g benzathine (BICILLIN LA) 1200000 UNIT/2ML injection 2.4 Million Units     2.4 Million Units Intramuscular  Once 11/17/16 2056 11/17/16 2112       Objective   Vitals:   11/20/16 1400 11/20/16 2110 11/21/16 0517 11/21/16 1413  BP: (!) 161/92 (!) 166/93 (!) 157/95 (!) 168/94  Pulse: 73  80 78 68  Resp: 16 18 18 16   Temp: 98.4 F (36.9 C) 98.5 F (36.9 C) 99.5 F (37.5 C) 99.2 F (37.3 C)  TempSrc: Oral Oral Oral Oral  SpO2: 99% 100% 98% 97%  Weight:      Height:        Intake/Output Summary (Last 24 hours) at 11/21/16 1452 Last data filed at 11/21/16 1412  Gross per 24 hour  Intake              222 ml  Output                0 ml  Net              222 ml   Filed Weights   11/17/16 1223 11/17/16 2150 11/17/16 2151  Weight: 88 kg (194 lb) 91.5 kg (201 lb 11.2 oz) 91.1 kg (200 lb 12.8 oz)     Physical Examination:        Physical Exam: Lungs: Normal respiratory effort, bilateral clear to auscultation, no crackles or wheezes.  Heart: Regular rate and rhythm, S1 and S2 normal, no murmurs, rubs auscultated Abdomen: BS normoactive,soft,nondistended,non-tender to palpation,no organomegaly Extremities: No pretibial edema, no erythema, no cyanosis, no clubbing       Data Reviewed: I have personally reviewed following labs and imaging studies  CBG: No results for input(s): GLUCAP in the last 168 hours.  CBC:  Recent Labs Lab 11/17/16 1233 11/18/16 0326  WBC  11.3* 9.2  NEUTROABS 8.2* 6.3  HGB 10.7* 10.8*  HCT 32.4* 32.1*  MCV 82.4 83.8  PLT 358 003    Basic Metabolic Panel:  Recent Labs Lab 11/17/16 1233 11/18/16 0326 11/19/16 0405  NA 140 142 141  K 3.5 3.2* 4.0  CL 104 106 105  CO2 30 29 30   GLUCOSE 103* 84 113*  BUN 11 7 6   CREATININE 1.15 1.17 1.13  CALCIUM 9.4 8.6* 8.9    No results found for this or any previous visit (from the past 240 hour(s)).   Liver Function Tests:  Recent Labs Lab 11/18/16 0326  AST 21  ALT 17  ALKPHOS 64  BILITOT 0.3  PROT 6.5  ALBUMIN 3.1*   No results for input(s): LIPASE, AMYLASE in the last 168 hours. No results for input(s): AMMONIA in the last 168 hours.  Cardiac Enzymes: No results for input(s): CKTOTAL, CKMB, CKMBINDEX, TROPONINI in the last 168 hours. BNP (last 3  results)  Recent Labs  11/17/16 1247  BNP 32.1    ProBNP (last 3 results) No results for input(s): PROBNP in the last 8760 hours.    Studies: No results found.  Scheduled Meds: . amLODipine  10 mg Oral Daily  . [START ON 11/22/2016] heparin  5,000 Units Intravenous Q8H      Time spent: 25 min  [facility] Pager [phone]. If 7PM-7AM, please contact night-coverage at [email or web address], Office  [phone]  password [email or web address]  11/21/2016, 2:52 PM  LOS: 2 days

## 2016-11-21 NOTE — Progress Notes (Signed)
         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 There is low suspicion for STI since RPR was negative x2 (dilutional titers requested), GC/Chlamydia negative, HIV antibody also returned non-reactive. However, the patient was given 2.42million u IM PCN for possible syphilis. -Patient's left lower extremity edema has decreased. Doppler ultrasound did not show any evidence for dvt 8/9 -Lymph node biopsy (intrapelvic and left external iliac) to be done today 8/13 by urology    . [START ON 11/22/2016] heparin  5,000 Units Intravenous Q8H    SUBJECTIVE: Ronald Wilson was seen sitting up in his bed in no acute distress. He denied having any sob or chest pain. He has still continued to note some bloody discharge from his penis.   Review of Systems: ROS as above  No Known Allergies  OBJECTIVE: Vitals:   11/20/16 0537 11/20/16 1400 11/20/16 2110 11/21/16 0517  BP: (!) 162/93 (!) 161/92 (!) 166/93 (!) 157/95  Pulse: 72 73 80 78  Resp: 18 16 18 18   Temp: 98.3 F (36.8 C) 98.4 F (36.9 C) 98.5 F (36.9 C) 99.5 F (37.5 C)  TempSrc: Oral Oral Oral Oral  SpO2: 96% 99% 100% 98%  Weight:      Height:       Body mass index is 29.65 kg/m.  Physical Exam  Constitutional: He is well-developed, well-nourished, and in no distress.  HENT:  Head: Normocephalic and atraumatic.  Cardiovascular: Normal rate, regular rhythm and intact distal pulses.   No murmur heard. Pulmonary/Chest: Effort normal and breath sounds normal. No respiratory distress. He has no wheezes.  Abdominal: Soft. Bowel sounds are normal. He exhibits no distension. There is tenderness.  Genitourinary: Bloody and discharge found.  Genitourinary Comments: Pink outgrowth  coming out of urethral opening. A hard 8cm mass present on groin.   Musculoskeletal: He exhibits edema (left lower extremity edema has decreased from previous).  Psychiatric: Mood, memory, affect and judgment normal.    Lab Results Lab Results  Component Value Date   WBC 9.2 11/18/2016   HGB 10.8 (L) 11/18/2016   HCT 32.1 (L) 11/18/2016   MCV 83.8 11/18/2016   PLT 353 11/18/2016    Lab Results  Component Value Date   CREATININE 1.13 11/19/2016   BUN 6 11/19/2016   NA 141 11/19/2016   K 4.0 11/19/2016   CL 105 11/19/2016   CO2 30 11/19/2016    Lab Results  Component Value Date   ALT 17 11/18/2016   AST 21 11/18/2016   ALKPHOS 64 11/18/2016   BILITOT 0.3 11/18/2016     Microbiology: No results found for this or any previous visit (from the past 240 hour(s)).  Lars Mage, [facility] [phone] pager   [phone] cell 11/21/2016, 8:36 AM

## 2016-11-21 NOTE — Progress Notes (Signed)
Unable to proceed with biopsy today secondary to emergent procedure.  Will plan for tomorrow 8/14.  Orders in place.  Lance Huaracha E 12:56 PM 11/21/2016

## 2016-11-22 ENCOUNTER — Inpatient Hospital Stay (HOSPITAL_COMMUNITY): Payer: Medicaid Other

## 2016-11-22 DIAGNOSIS — I10 Essential (primary) hypertension: Secondary | ICD-10-CM

## 2016-11-22 MED ORDER — SODIUM CHLORIDE 0.9 % IV SOLN
INTRAVENOUS | Status: AC | PRN
  Administered 2016-11-22: 10 mL/h via INTRAVENOUS

## 2016-11-22 MED ORDER — MIDAZOLAM HCL 2 MG/2ML IJ SOLN
INTRAMUSCULAR | Status: AC | PRN
  Administered 2016-11-22 (×2): 0.5 mg via INTRAVENOUS
  Administered 2016-11-22: 1 mg via INTRAVENOUS

## 2016-11-22 MED ORDER — FENTANYL CITRATE (PF) 100 MCG/2ML IJ SOLN
INTRAMUSCULAR | Status: AC
  Filled 2016-11-22: qty 2

## 2016-11-22 MED ORDER — FENTANYL CITRATE (PF) 100 MCG/2ML IJ SOLN
INTRAMUSCULAR | Status: AC | PRN
  Administered 2016-11-22: 25 ug via INTRAVENOUS
  Administered 2016-11-22: 50 ug via INTRAVENOUS
  Administered 2016-11-22: 25 ug via INTRAVENOUS

## 2016-11-22 MED ORDER — HEPARIN SODIUM (PORCINE) 5000 UNIT/ML IJ SOLN
5000.0000 [IU] | Freq: Three times a day (TID) | INTRAMUSCULAR | Status: DC
  Administered 2016-11-22 – 2016-11-23 (×2): 5000 [IU] via SUBCUTANEOUS
  Filled 2016-11-22 (×2): qty 1

## 2016-11-22 MED ORDER — LIDOCAINE-EPINEPHRINE 1 %-1:100000 IJ SOLN
INTRAMUSCULAR | Status: AC
  Filled 2016-11-22: qty 1

## 2016-11-22 MED ORDER — MIDAZOLAM HCL 2 MG/2ML IJ SOLN
INTRAMUSCULAR | Status: AC
Start: 2016-11-22 — End: 2016-11-22
  Filled 2016-11-22: qty 2

## 2016-11-22 NOTE — Procedures (Signed)
Pre Procedure Dx: Inguinal lymphadenopathy Post Procedural Dx: Same  Technically successful US guided biopsy of indeterminate left inguinal LN  EBL: None  No immediate complications.   Ronny Bacon, MD Pager #: [phone]

## 2016-11-22 NOTE — Progress Notes (Signed)
[facility]  PROGRESS NOTE  Kashis Penley ZCH:[id number] DOB: December 13, 1975 DOA: 11/17/2016 PCP: Patient, No Pcp Per   Brief HPI:   41 y.o. male without medical history who is coming to the ED with complaints of several months of progressively worse LLE edema for several months. He was seen last month seen in the [facility], had [place] of lower extremity which was negative for DVT. He was seen for balanitis in October of last year by Dr. Audie Pinto. At that time, he was referred to urology, but has not followed up. He stated that he is heterosexual, but has not had sexual contact since he developed this condition.     Subjective   Patient seen and examined, he is going for LN biopsy today.   Assessment/Plan:     1. Inguinal mass- patient has large left inguinal lymph node, Urology has been consulted. Plan for lymph node biopsy today. I called and discussed with Urologist on call Dr Tresa Moore, who recommends to observe him overnight after biopsy, and discharge home in am.Urology will follow up in 2 weeks and review biopsy results. 2. Penile mass- urology following. Possible malignancy. 3.  Hypertension- blood pressure is stable after starting Amlodipine 10 mg by mouth daily. Will continue Amlodpine as outpatient if BP remain stable. 4. Hypokalemia-replete, potassium is 4.0. 5. ? STI- patient was empirically given benzathine penicillin G 2.4 million units intramuscularly for possible syphilis. All the labs are negative for STI. RPR is negative, including Chlamydia from urine negative. ID has signed off.   DVT prophylaxis: Heparin  Code Status: Full code  Family Communication: No family at bedside   Disposition Plan: Likely home in am.   Consultants:  None  Procedures:  None   Continuous infusions     Antibiotics:   Anti-infectives    Start     Dose/Rate Route Frequency Ordered Stop   11/17/16 2100  penicillin g benzathine (BICILLIN LA) 1200000 UNIT/2ML injection 2.4 Million Units      2.4 Million Units Intramuscular  Once 11/17/16 2056 11/17/16 2112       Objective   Vitals:   11/22/16 1015 11/22/16 1020 11/22/16 1025 11/22/16 1108  BP: (!) 159/92 (!) 151/99 (!) 159/97 138/86  Pulse: 90 94 98 85  Resp: 14 13 16 17   Temp:    98.2 F (36.8 C)  TempSrc:    Oral  SpO2: 100% 98% 95% 100%  Weight:      Height:        Intake/Output Summary (Last 24 hours) at 11/22/16 1302 Last data filed at 11/22/16 1030  Gross per 24 hour  Intake              462 ml  Output               35 ml  Net              427 ml   Filed Weights   11/17/16 1223 11/17/16 2150 11/17/16 2151  Weight: 88 kg (194 lb) 91.5 kg (201 lb 11.2 oz) 91.1 kg (200 lb 12.8 oz)     Physical Examination:  Physical Exam: Lungs: Normal respiratory effort, bilateral clear to auscultation, no crackles or wheezes.  Heart: Regular rate and rhythm, S1 and S2 normal, no murmurs, rubs auscultated Abdomen: Large fixed lymph node palpated in the left inguinal region, nontender to palpation Extremities - trace edema of left lower extremity        Data Reviewed: I have personally reviewed following  labs and imaging studies  CBG: No results for input(s): GLUCAP in the last 168 hours.  CBC:  Recent Labs Lab 11/17/16 1233 11/18/16 0326  WBC 11.3* 9.2  NEUTROABS 8.2* 6.3  HGB 10.7* 10.8*  HCT 32.4* 32.1*  MCV 82.4 83.8  PLT 358 761    Basic Metabolic Panel:  Recent Labs Lab 11/17/16 1233 11/18/16 0326 11/19/16 0405  NA 140 142 141  K 3.5 3.2* 4.0  CL 104 106 105  CO2 30 29 30   GLUCOSE 103* 84 113*  BUN 11 7 6   CREATININE 1.15 1.17 1.13  CALCIUM 9.4 8.6* 8.9    No results found for this or any previous visit (from the past 240 hour(s)).   Liver Function Tests:  Recent Labs Lab 11/18/16 0326  AST 21  ALT 17  ALKPHOS 64  BILITOT 0.3  PROT 6.5  ALBUMIN 3.1*   No results for input(s): LIPASE, AMYLASE in the last 168 hours. No results for input(s): AMMONIA in the last 168  hours.  Cardiac Enzymes: No results for input(s): CKTOTAL, CKMB, CKMBINDEX, TROPONINI in the last 168 hours. BNP (last 3 results)  Recent Labs  11/17/16 1247  BNP 32.1    ProBNP (last 3 results) No results for input(s): PROBNP in the last 8760 hours.    Studies: US Biopsy  Result Date: 01-Dec-2016 INDICATION: No known primary, now with indeterminate retroperitoneal, pelvic and left inguinal lymphadenopathy. Please perform ultrasound-guided biopsy for tissue diagnostic purposes. EXAM: ULTRASOUND-GUIDED LEFT INGUINAL LYMPH NODE CORE NEEDLE BIOPSY AND ASPIRATION. COMPARISON:  CT abdomen and pelvis - 11/17/2016 MEDICATIONS: None ANESTHESIA/SEDATION: Moderate (conscious) sedation was employed during this procedure. A total of Versed 2 mg and Fentanyl 100 mcg was administered intravenously. Moderate Sedation Time: 14 minutes. The patient's level of consciousness and vital signs were monitored continuously by radiology nursing throughout the procedure under my direct supervision. COMPLICATIONS: None immediate. TECHNIQUE: Informed written consent was obtained from the patient after a discussion of the risks, benefits and alternatives to treatment. Questions regarding the procedure were encouraged and answered. Initial ultrasound scanning demonstrated a large knee mixed echogenic septated mass within the right groin compatible with the known partially cystic, partially solid presumed necrotic left inguinal lymphadenopathy. An ultrasound image was saved for documentation purposes. The procedure was planned. A timeout was performed prior to the initiation of the procedure. The operative was prepped and draped in the usual sterile fashion, and a sterile drape was applied covering the operative field. A timeout was performed prior to the initiation of the procedure. Local anesthesia was provided with 1% lidocaine with epinephrine. Under direct ultrasound guidance, an 18 gauge core needle device was utilized to  obtain to obtain 4 core needle biopsies of the solid component of the left inguinal necrotic mass. Next, approximately 35 cc of purulent appearing, slightly blood tinged fluid was aspirated from the cystic component of the left inguinal adenopathy. The samples were placed in saline and submitted to pathology. The needle was removed and hemostasis was achieved with manual compression. Post procedure scan was negative for significant hematoma. A dressing was placed. The patient tolerated the procedure well without immediate postprocedural complication. IMPRESSION: Technically successful ultrasound guided biopsy of dominant left inguinal lymph node including aspiration of approximately 35 cc of purulent appearing, slightly blood tinged fluid from the dominant cystic component. Note, samples were sent for surgical path, aerobic and anaerobic culture, fungal and AFB analysis. Electronically Signed   By: Sandi Mariscal M.D.   On: 12/01/16 11:31  Scheduled Meds: . amLODipine  10 mg Oral Daily  . fentaNYL      . heparin  5,000 Units Intravenous Q8H  . lidocaine-EPINEPHrine      . midazolam          Time spent: 25 min  [facility] Pager [phone]. If 7PM-7AM, please contact night-coverage at [email or web address], Office  [email or web address]  password [email or web address]  11/22/2016, 1:02 PM  LOS: 3 days

## 2016-11-23 DIAGNOSIS — R1909 Other intra-abdominal and pelvic swelling, mass and lump: Secondary | ICD-10-CM

## 2016-11-23 DIAGNOSIS — N485 Ulcer of penis: Secondary | ICD-10-CM

## 2016-11-23 DIAGNOSIS — R600 Localized edema: Secondary | ICD-10-CM

## 2016-11-23 MED ORDER — AMLODIPINE BESYLATE 10 MG PO TABS: 10.0000 mg | ORAL_TABLET | Freq: Every day | ORAL | 0 refills | Status: DC

## 2016-11-23 NOTE — Care Management Note (Signed)
Case Management Note  Patient Details  Name: Ronald Wilson MRN: [id number] Date of Birth: 1976-02-18  Subjective/Objective:              Admitted inguinal mass.   Action/Plan: Plan is to d/c to home today. Post hospital follow up scheduled for 11/28/2016 with [facility] NP @ 9am. CM provided pt with goodrx coupon to help with medication cost for Norvasc, pt  without insurance.  Expected Discharge Date:  11/23/16               Expected Discharge Plan:  Home/Self Care  In-House Referral:     Discharge planning Services  CM Consult, Follow-up appt scheduled, [facility]  Post Acute Care Choice:    Choice offered to:     DME Arranged:    DME Agency:     HH Arranged:    HH Agency:     Status of Service:  Completed, signed off  If discussed at [employer], dates discussed:    Additional Comments:  Sharin Mons, RN 11/23/2016, 11:49 AM

## 2016-11-23 NOTE — Discharge Summary (Signed)
Physician Discharge Summary  Ronald Wilson TOI:[id number] DOB: 06-02-1975 DOA: 11/17/2016  PCP: Patient, No Pcp Per  Admit date: 11/17/2016 Discharge date: 11/23/2016  Admitted From: Home Disposition:  Home  Recommendations for Outpatient Follow-up:  1. Follow up with PCP in 1 week 2. Follow-up with urology in 2 weeks   Home Health: No  Equipment/Devices: None  Discharge Condition: Stable  CODE STATUS: Full Diet recommendation: Heart Healthy   Brief/Interim Summary: 41 year male without past medical history presented with several months of progressive worse left lower extremity edema and lower extremity ultrasound negative for DVT last month. He was found to have penile mass and inguinal adenopathy. Patient was evaluated by urology. He underwent ultrasound guided biopsy of the left inguinal lymph node on 11/22/2016. Pathology is pending. Urology has cleared the patient for discharge with outpatient follow-up with urology in 2 weeks  Discharge Diagnoses:  Principal Problem:   Inguinal mass Active Problems:   Ulcer of penis   Anemia   Unilateral edema of left lower extremity   Tobacco use disorder   Inguinal lymphadenopathy   Inguinal lymphadenopathy with penile mass - status post ultrasound guided biopsy of the left inguinal lymph node on 11/22/2016. Pathology is pending. Urology has cleared the patient for discharge with outpatient follow-up with urology in 2 weeks  Hypertension-continue amlodipine on discharge. Blood pressure stable currently   Hypokalemia-repleted  ? STI- patient was empirically given benzathine penicillin G 2.4 million units intramuscularly for possible syphilis. All the labs are negative for STI. RPR is negative, including Chlamydia from urine negative. ID has signed off.   Discharge Instructions  Discharge Instructions    Call MD for:  difficulty breathing, headache or visual disturbances    Complete by:  As directed    Call MD for:  extreme  fatigue    Complete by:  As directed    Call MD for:  hives    Complete by:  As directed    Call MD for:  persistant dizziness or light-headedness    Complete by:  As directed    Call MD for:  persistant nausea and vomiting    Complete by:  As directed    Call MD for:  severe uncontrolled pain    Complete by:  As directed    Call MD for:  temperature >100.4    Complete by:  As directed    Diet - low sodium heart healthy    Complete by:  As directed    Increase activity slowly    Complete by:  As directed      Allergies as of 11/23/2016   No Known Allergies     Medication List    STOP taking these medications   HYDROcodone-acetaminophen 5-325 MG tablet Commonly known as:  NORCO/VICODIN   ibuprofen 800 MG tablet Commonly known as:  ADVIL,MOTRIN     TAKE these medications   amLODipine 10 MG tablet Commonly known as:  NORVASC Take 1 tablet (10 mg total) by mouth daily.      Follow-up Information    McKenzie, Candee Furbish, MD Follow up in 2 week(s).   Specialty:  Urology Contact information: [place] [address] [phone]          No Known Allergies  Consultations:  Urology   Procedures/Studies: Ct Abdomen Pelvis W Contrast  Result Date: 11/17/2016 CLINICAL DATA:  Left lower quadrant and groin pain. EXAM: CT ABDOMEN AND PELVIS WITH CONTRAST TECHNIQUE: Multidetector CT imaging of the abdomen  and pelvis was performed using the standard protocol following bolus administration of intravenous contrast. CONTRAST:  143mL ISOVUE-300 IOPAMIDOL (ISOVUE-300) INJECTION 61% COMPARISON:  None. FINDINGS: Lower chest: Lung bases are within normal. Hepatobiliary: Within normal. Pancreas: Within normal. Spleen: Within normal. Adrenals/Urinary Tract: Adrenal glands are normal. Kidneys normal in size without hydronephrosis or nephrolithiasis. There is a 1.2 cm cyst over the mid to lower pole right kidney. Ureters and bladder are normal. Stomach/Bowel: Stomach and  small bowel are within normal. Appendix is normal. Minimal diverticulosis of the colon. Vascular/Lymphatic: Minimal calcified plaque over the abdominal aorta. There is a prominent heterogeneous solid and cystic mass with subtle areas of calcification over the left superficial inguinal region. This likely represents adenopathy and measures 8.1 cm in diameter. There are adjacent similar appearing smaller left inguinal lymph nodes. There are similar low-density partially calcified left iliac chain lymph nodes with the largest measuring 6.2 cm in greatest diameter. There is adenopathy in the distal left periaortic region with largest node measuring 2.3 cm by short axis. Reproductive: Within normal. Other: Mild subcutaneous edema over the left inguinal region and left upper leg. Musculoskeletal: Within normal. IMPRESSION: Findings compatible with significant heterogeneous left inguinal and left iliac chain adenopathy with lesser adenopathy in the periaortic region. Mild associated subcutaneous edema over the left inguinal region and upper leg. Findings are likely due to a neoplastic process, although an infectious/granulomatous process is not excluded. Recommend clinical correlation and diagnostic tissue sampling for further evaluation. 1.2 cm right renal cyst. Mild colonic diverticulosis. Aortic Atherosclerosis (ICD10-I70.0). Electronically Signed   By: Marin Olp M.D.   On: 11/17/2016 17:25   US Biopsy  Result Date: 11/22/2016 INDICATION: No known primary, now with indeterminate retroperitoneal, pelvic and left inguinal lymphadenopathy. Please perform ultrasound-guided biopsy for tissue diagnostic purposes. EXAM: ULTRASOUND-GUIDED LEFT INGUINAL LYMPH NODE CORE NEEDLE BIOPSY AND ASPIRATION. COMPARISON:  CT abdomen and pelvis - 11/17/2016 MEDICATIONS: None ANESTHESIA/SEDATION: Moderate (conscious) sedation was employed during this procedure. A total of Versed 2 mg and Fentanyl 100 mcg was administered  intravenously. Moderate Sedation Time: 14 minutes. The patient's level of consciousness and vital signs were monitored continuously by radiology nursing throughout the procedure under my direct supervision. COMPLICATIONS: None immediate. TECHNIQUE: Informed written consent was obtained from the patient after a discussion of the risks, benefits and alternatives to treatment. Questions regarding the procedure were encouraged and answered. Initial ultrasound scanning demonstrated a large knee mixed echogenic septated mass within the right groin compatible with the known partially cystic, partially solid presumed necrotic left inguinal lymphadenopathy. An ultrasound image was saved for documentation purposes. The procedure was planned. A timeout was performed prior to the initiation of the procedure. The operative was prepped and draped in the usual sterile fashion, and a sterile drape was applied covering the operative field. A timeout was performed prior to the initiation of the procedure. Local anesthesia was provided with 1% lidocaine with epinephrine. Under direct ultrasound guidance, an 18 gauge core needle device was utilized to obtain to obtain 4 core needle biopsies of the solid component of the left inguinal necrotic mass. Next, approximately 35 cc of purulent appearing, slightly blood tinged fluid was aspirated from the cystic component of the left inguinal adenopathy. The samples were placed in saline and submitted to pathology. The needle was removed and hemostasis was achieved with manual compression. Post procedure scan was negative for significant hematoma. A dressing was placed. The patient tolerated the procedure well without immediate postprocedural complication. IMPRESSION: Technically successful ultrasound  guided biopsy of dominant left inguinal lymph node including aspiration of approximately 35 cc of purulent appearing, slightly blood tinged fluid from the dominant cystic component. Note, samples  were sent for surgical path, aerobic and anaerobic culture, fungal and AFB analysis. Electronically Signed   By: Sandi Mariscal M.D.   On: 11/22/2016 11:31      Subjective: Patient seen and examined at bedside. He denies any overnight fever, nausea, vomiting.  Discharge Exam: Vitals:   11/22/16 2227 11/23/16 0614  BP: 130/81 127/84  Pulse: 82 84  Resp: 18 16  Temp: 98.3 F (36.8 C) 97.6 F (36.4 C)  SpO2: 100% 100%   Vitals:   11/22/16 1108 11/22/16 1339 11/22/16 2227 11/23/16 0614  BP: 138/86 127/73 130/81 127/84  Pulse: 85 78 82 84  Resp: 17 16 18 16   Temp: 98.2 F (36.8 C) 98.3 F (36.8 C) 98.3 F (36.8 C) 97.6 F (36.4 C)  TempSrc: Oral Oral Oral Oral  SpO2: 100% 98% 100% 100%  Weight:      Height:        General: Pt is alert, awake, not in acute distress Cardiovascular: Rate controlled, S1/S2 + Respiratory: Bilateral decreased breath sounds at bases  Abdominal: Soft, NT, ND, bowel sounds + Extremities: Trace edema of left lower extremity, no cyanosis    The results of significant diagnostics from this hospitalization (including imaging, microbiology, ancillary and laboratory) are listed below for reference.     Microbiology: Recent Results (from the past 240 hour(s))  Aerobic/Anaerobic Culture (surgical/deep wound)     Status: None (Preliminary result)   Collection Time: 11/22/16 10:43 AM  Result Value Ref Range Status   Specimen Description ABSCESS LYMPH NODE  Final   Special Requests Normal  Final   Gram Stain   Final    MODERATE WBC PRESENT, PREDOMINANTLY PMN NO ORGANISMS SEEN    Culture PENDING  Incomplete   Report Status PENDING  Incomplete     Labs: BNP (last 3 results)  Recent Labs  11/17/16 1247  BNP 84.1   Basic Metabolic Panel:  Recent Labs Lab 11/17/16 1233 11/18/16 0326 11/19/16 0405  NA 140 142 141  K 3.5 3.2* 4.0  CL 104 106 105  CO2 30 29 30   GLUCOSE 103* 84 113*  BUN 11 7 6   CREATININE 1.15 1.17 1.13  CALCIUM 9.4  8.6* 8.9   Liver Function Tests:  Recent Labs Lab 11/18/16 0326  AST 21  ALT 17  ALKPHOS 64  BILITOT 0.3  PROT 6.5  ALBUMIN 3.1*   No results for input(s): LIPASE, AMYLASE in the last 168 hours. No results for input(s): AMMONIA in the last 168 hours. CBC:  Recent Labs Lab 11/17/16 1233 11/18/16 0326  WBC 11.3* 9.2  NEUTROABS 8.2* 6.3  HGB 10.7* 10.8*  HCT 32.4* 32.1*  MCV 82.4 83.8  PLT 358 353   Cardiac Enzymes: No results for input(s): CKTOTAL, CKMB, CKMBINDEX, TROPONINI in the last 168 hours. BNP: Invalid input(s): POCBNP CBG: No results for input(s): GLUCAP in the last 168 hours. D-Dimer No results for input(s): DDIMER in the last 72 hours. Hgb A1c No results for input(s): HGBA1C in the last 72 hours. Lipid Profile No results for input(s): CHOL, HDL, LDLCALC, TRIG, CHOLHDL, LDLDIRECT in the last 72 hours. Thyroid function studies No results for input(s): TSH, T4TOTAL, T3FREE, THYROIDAB in the last 72 hours.  Invalid input(s): FREET3 Anemia work up No results for input(s): VITAMINB12, FOLATE, FERRITIN, TIBC, IRON, RETICCTPCT in the last  72 hours. Urinalysis    Component Value Date/Time   COLORURINE YELLOW 11/17/2016 1719   APPEARANCEUR CLEAR 11/17/2016 1719   LABSPEC 1.044 (H) 11/17/2016 1719   PHURINE 6.0 11/17/2016 1719   GLUCOSEU NEGATIVE 11/17/2016 1719   HGBUR LARGE (A) 11/17/2016 1719   BILIRUBINUR NEGATIVE 11/17/2016 1719   KETONESUR NEGATIVE 11/17/2016 1719   PROTEINUR NEGATIVE 11/17/2016 1719   NITRITE NEGATIVE 11/17/2016 1719   LEUKOCYTESUR MODERATE (A) 11/17/2016 1719   Sepsis Labs Invalid input(s): PROCALCITONIN,  WBC,  LACTICIDVEN Microbiology Recent Results (from the past 240 hour(s))  Aerobic/Anaerobic Culture (surgical/deep wound)     Status: None (Preliminary result)   Collection Time: 11/22/16 10:43 AM  Result Value Ref Range Status   Specimen Description ABSCESS LYMPH NODE  Final   Special Requests Normal  Final   Gram Stain    Final    MODERATE WBC PRESENT, PREDOMINANTLY PMN NO ORGANISMS SEEN    Culture PENDING  Incomplete   Report Status PENDING  Incomplete     Time coordinating discharge: 35 minutes  SIGNED:   Aline August, MD  [facility] 11/23/2016, 10:34 AM Pager: [phone]  If 7PM-7AM, please contact night-coverage [email or web address] Password [email or web address]

## 2016-11-27 LAB — AEROBIC/ANAEROBIC CULTURE (SURGICAL/DEEP WOUND): SPECIAL REQUESTS: NORMAL

## 2016-11-27 LAB — AEROBIC/ANAEROBIC CULTURE W GRAM STAIN (SURGICAL/DEEP WOUND): Culture: NO GROWTH

## 2016-11-27 LAB — ACID FAST SMEAR (AFB): ACID FAST SMEAR - AFSCU2: NEGATIVE

## 2016-11-27 LAB — ACID FAST SMEAR (AFB, MYCOBACTERIA)

## 2016-11-28 ENCOUNTER — Encounter: Payer: Self-pay | Admitting: Family Medicine

## 2016-11-28 ENCOUNTER — Ambulatory Visit (INDEPENDENT_AMBULATORY_CARE_PROVIDER_SITE_OTHER): Payer: Self-pay | Admitting: Family Medicine

## 2016-11-28 VITALS — BP 148/96 | HR 102 | Temp 98.7°F | Resp 14 | Ht 69.0 in | Wt 200.0 lb

## 2016-11-28 DIAGNOSIS — I10 Essential (primary) hypertension: Secondary | ICD-10-CM

## 2016-11-28 DIAGNOSIS — Z23 Encounter for immunization: Secondary | ICD-10-CM

## 2016-11-28 DIAGNOSIS — R1909 Other intra-abdominal and pelvic swelling, mass and lump: Secondary | ICD-10-CM

## 2016-11-28 DIAGNOSIS — D649 Anemia, unspecified: Secondary | ICD-10-CM

## 2016-11-28 DIAGNOSIS — C801 Malignant (primary) neoplasm, unspecified: Secondary | ICD-10-CM

## 2016-11-28 DIAGNOSIS — E876 Hypokalemia: Secondary | ICD-10-CM

## 2016-11-28 DIAGNOSIS — R590 Localized enlarged lymph nodes: Secondary | ICD-10-CM

## 2016-11-28 DIAGNOSIS — N485 Ulcer of penis: Secondary | ICD-10-CM

## 2016-11-28 LAB — CBC
HEMATOCRIT: 38.2 % — AB (ref 38.5–50.0)
HEMOGLOBIN: 12.2 g/dL — AB (ref 13.2–17.1)
MCH: 28.4 pg (ref 27.0–33.0)
MCHC: 31.9 g/dL — AB (ref 32.0–36.0)
MCV: 88.8 fL (ref 80.0–100.0)
MPV: 10.3 fL (ref 7.5–12.5)
Platelets: 395 10*3/uL (ref 140–400)
RBC: 4.3 MIL/uL (ref 4.20–5.80)
RDW: 13.2 % (ref 11.0–15.0)
WBC: 9 10*3/uL (ref 3.8–10.8)

## 2016-11-28 MED ORDER — AMLODIPINE BESYLATE 10 MG PO TABS: 10.0000 mg | ORAL_TABLET | Freq: Every day | ORAL | 5 refills | Status: DC

## 2016-11-28 NOTE — Patient Instructions (Addendum)
An urgent referral has been sent to oncology.  Discussed results of your lymph node biopsy at length.  If you have not heard from oncology scheduler by the week's end, please contact me at [phone].  Coping with Quitting Smoking Quitting smoking is a physical and mental challenge. You will face cravings, withdrawal symptoms, and temptation. Before quitting, work with your health care provider to make a plan that can help you cope. Preparation can help you quit and keep you from giving in. How can I cope with cravings? Cravings usually last for 5-10 minutes. If you get through it, the craving will pass. Consider taking the following actions to help you cope with cravings:  Keep your mouth busy: ? Chew sugar-free gum. ? Suck on hard candies or a straw. ? Brush your teeth.  Keep your hands and body busy: ? Immediately change to a different activity when you feel a craving. ? Squeeze or play with a ball. ? Do an activity or a hobby, like making bead jewelry, practicing needlepoint, or working with wood. ? Mix up your normal routine. ? Take a short exercise break. Go for a quick walk or run up and down stairs. ? Spend time in public places where smoking is not allowed.  Focus on doing something kind or helpful for someone else.  Call a friend or family member to talk during a craving.  Join a support group.  Call a quit line, such as 1-800-QUIT-NOW.  Talk with your health care provider about medicines that might help you cope with cravings and make quitting easier for you.  How can I deal with withdrawal symptoms? Your body may experience negative effects as it tries to get used to not having nicotine in the system. These effects are called withdrawal symptoms. They may include:  Feeling hungrier than normal.  Trouble concentrating.  Irritability.  Trouble sleeping.  Feeling depressed.  Restlessness and agitation.  Craving a cigarette.  To manage withdrawal  symptoms:  Avoid places, people, and activities that trigger your cravings.  Remember why you want to quit.  Get plenty of sleep.  Avoid coffee and other caffeinated drinks. These may worsen some of your symptoms.  How can I handle social situations? Social situations can be difficult when you are quitting smoking, especially in the first few weeks. To manage this, you can:  Avoid parties, bars, and other social situations where people might be smoking.  Avoid alcohol.  Leave right away if you have the urge to smoke.  Explain to your family and friends that you are quitting smoking. Ask for understanding and support.  Plan activities with friends or family where smoking is not an option.  What are some ways I can cope with stress? Wanting to smoke may cause stress, and stress can make you want to smoke. Find ways to manage your stress. Relaxation techniques can help. For example:  Breathe slowly and deeply, in through your nose and out through your mouth.  Listen to soothing, relaxing music.  Talk with a family member or friend about your stress.  Light a candle.  Soak in a bath or take a shower.  Think about a peaceful place.  What are some ways I can prevent weight gain? Be aware that many people gain weight after they quit smoking. However, not everyone does. To keep from gaining weight, have a plan in place before you quit and stick to the plan after you quit. Your plan should include:  Having healthy snacks.  When you have a craving, it may help to: ? Eat plain popcorn, crunchy carrots, celery, or other cut vegetables. ? Chew sugar-free gum.  Changing how you eat: ? Eat small portion sizes at meals. ? Eat 4-6 small meals throughout the day instead of 1-2 large meals a day. ? Be mindful when you eat. Do not watch television or do other things that might distract you as you eat.  Exercising regularly: ? Make time to exercise each day. If you do not have time for a  long workout, do short bouts of exercise for 5-10 minutes several times a day. ? Do some form of strengthening exercise, like weight lifting, and some form of aerobic exercise, like running or swimming.  Drinking plenty of water or other low-calorie or no-calorie drinks. Drink 6-8 glasses of water daily, or as much as instructed by your health care provider.  Summary  Quitting smoking is a physical and mental challenge. You will face cravings, withdrawal symptoms, and temptation to smoke again. Preparation can help you as you go through these challenges.  You can cope with cravings by keeping your mouth busy (such as by chewing gum), keeping your body and hands busy, and making calls to family, friends, or a helpline for people who want to quit smoking.  You can cope with withdrawal symptoms by avoiding places where people smoke, avoiding drinks with caffeine, and getting plenty of rest.  Ask your health care provider about the different ways to prevent weight gain, avoid stress, and handle social situations. This information is not intended to replace advice given to you by your health care provider. Make sure you discuss any questions you have with your health care provider. Document Released: 03/25/2016 Document Revised: 03/25/2016 Document Reviewed: 03/25/2016 Elsevier Interactive Patient Education  2018 [employer].  Lymphadenopathy Lymphadenopathy refers to swollen or enlarged lymph glands, also called lymph nodes. Lymph glands are part of your body's defense (immune) system, which protects the body from infections, germs, and diseases. Lymph glands are found in many locations in your body, including the neck, underarm, and groin. Many things can cause lymph glands to become enlarged. When your immune system responds to germs, such as viruses or bacteria, infection-fighting cells and fluid build up. This causes the glands to grow in size. Usually, this is not something to worry about. The  swelling and any soreness often go away without treatment. However, swollen lymph glands can also be caused by a number of diseases. Your health care provider may do various tests to help determine the cause. If the cause of your swollen lymph glands cannot be found, it is important to monitor your condition to make sure the swelling goes away. Follow these instructions at home: Watch your condition for any changes. The following actions may help to lessen any discomfort you are feeling:  Get plenty of rest.  Take medicines only as directed by your health care provider. Your health care provider may recommend over-the-counter medicines for pain.  Apply moist heat compresses to the site of swollen lymph nodes as directed by your health care provider. This can help reduce any pain.  Check your lymph nodes daily for any changes.  Keep all follow-up visits as directed by your health care provider. This is important.  Contact a health care provider if:  Your lymph nodes are still swollen after 2 weeks.  Your swelling increases or spreads to other areas.  Your lymph nodes are hard, seem fixed to the skin, or  are growing rapidly.  Your skin over the lymph nodes is red and inflamed.  You have a fever.  You have chills.  You have fatigue.  You develop a sore throat.  You have abdominal pain.  You have weight loss.  You have night sweats. Get help right away if:  You notice fluid leaking from the area of the enlarged lymph node.  You have severe pain in any area of your body.  You have chest pain.  You have shortness of breath. This information is not intended to replace advice given to you by your health care provider. Make sure you discuss any questions you have with your health care provider. Document Released: 01/05/2008 Document Revised: 09/03/2015 Document Reviewed: 10/31/2013 Elsevier Interactive Patient Education  [employer].

## 2016-11-28 NOTE — Progress Notes (Signed)
Subjective:    Patient ID: Ronald Wilson, male    DOB: 20-Jun-1975, 41 y.o.   MRN: [id number]  HPI Ronald Wilson, a 41 year old male that presents to establish care and for a post hospital follow up. Ronald Wilson has primarily been using the emergency department for all primary care needs. He currently resides with his ailing father and is his primary caregiver. Ronald Wilson has 7 children and 2 grandchildren.   He was recently admitted to inpatient services on 11/17/2016. He presented to the emergency department after several months of lower extremity edema, penile lesion, and left inguinal mass. Both swelling and ulceration are painless. Bleeding noted with clear discharge. He was also treated for balanitis in October 2017. He has not had not had unprotected sexual intercourse since that time. Patient had an infectious disease consult, which states that penile lesion does not appear to be an infectious process. Patient was treated empirically with benzathine penicillin G 2.4 million units IM for possible syphilis despite all laboratory values being negative for syphilis. A lymph node biopsy was sent for evaluation.  Patient is a chronic everyday smoker and has been smoking over the past 20 years. He says that he has decreased smoking since hospital discharge. Patient denies fever, fatigue, abdominal pain, nausea, vomiting, or diarrhea. He continues to have swelling to left groin and a worsening penile lesion.   Patient also has a history of hypertension. He was prescribed Norvasc 5 mg in the hospital and has been taking medications consistently. He has not been following a lowfat, low sodium diet. He also does not exercise routinely. He has a family history of hypertension.  Social History   Social History  . Marital status: Single    Spouse name: N/A  . Number of children: N/A  . Years of education: N/A   Occupational History  . Not on file.   Social History Main Topics  . Smoking  status: Current Every Day Smoker    Packs/day: 0.25    Years: 25.00    Types: Cigarettes  . Smokeless tobacco: Never Used  . Alcohol use 9.0 oz/week    15 Shots of liquor per week     Comment: "11/17/2016 "I'll have shots ~ 3 days/wk"  . Drug use: Yes    Types: Marijuana     Comment: 11/17/2016 "daily"  . Sexual activity: Yes    Birth control/ protection: Condom   Other Topics Concern  . Not on file   Social History Narrative  . No narrative on file   Immunization History  Administered Date(s) Administered  . Pneumococcal Polysaccharide-23 11/28/2016  . Tdap 11/28/2016    Review of Systems  Constitutional: Negative.  Negative for activity change, appetite change, fatigue, fever and unexpected weight change.  HENT: Negative.   Eyes: Negative.  Negative for photophobia and visual disturbance.  Respiratory: Negative.   Cardiovascular: Negative.   Gastrointestinal: Negative.   Endocrine: Negative for cold intolerance and heat intolerance.  Genitourinary: Positive for discharge and genital sores. Negative for decreased urine volume, hematuria, penile pain and urgency.       Left inguinal swelling  Musculoskeletal: Negative.   Skin: Negative.   Allergic/Immunologic: Negative for immunocompromised state.  Neurological: Negative.  Negative for light-headedness and headaches.  Hematological: Negative.   Psychiatric/Behavioral: Negative.        Objective:   Physical Exam  Constitutional: He is oriented to person, place, and time. He appears well-developed and well-nourished.  HENT:  Head: Normocephalic and  atraumatic.  Right Ear: External ear normal.  Left Ear: External ear normal.  Nose: Nose normal.  Mouth/Throat: Oropharynx is clear and moist.  Eyes: Pupils are equal, round, and reactive to light. Conjunctivae and EOM are normal.  Neck: Normal range of motion. Neck supple.  Cardiovascular: Normal rate, regular rhythm, normal heart sounds and intact distal pulses.     Pulmonary/Chest: Effort normal and breath sounds normal.  Abdominal: Soft. Bowel sounds are normal.  Genitourinary:    Penile erythema present. Discharge found.  Genitourinary Comments: 2.5 cm by 2 cm, erythematous, ulcerated, clear discharge, malodorous  Musculoskeletal: Normal range of motion.  Lymphadenopathy:       Left: Inguinal adenopathy present.  Neurological: He is alert and oriented to person, place, and time. He has normal reflexes.  Skin: Skin is warm and dry.  Psychiatric: He has a normal mood and affect. His behavior is normal. Judgment and thought content normal.     BP (!) 148/96 (BP Location: Left Arm, Patient Position: Sitting, Cuff Size: Normal) Comment: manually  Pulse (!) 102   Temp 98.7 F (37.1 C) (Oral)   Resp 14   Ht 5\' 9"  (1.753 m)   Wt 200 lb (90.7 kg)   SpO2 99%   BMI 29.53 kg/m  Assessment & Plan:  1. Carcinoma [facility]) Reviewed surgical pathology report: Poorly differentiated carcinoma. The core biopsies mostly dense fibrous tissue with focal inflammation and calcifications. Several microscopic areas of poorly differentiated carcinoma and there are features that raise the possibility of a squamous cell carcinoma. I will send a stat referral to oncology for further workup and evaluation  2. Inguinal mass Refer to # 1  3. Ulcer of penis Ulcerated, erythematous, malodorous lesion presents, meatus no longer visible. Patient advised to keep area clean and dry. Refrain from applying OTC products. I will defer to oncology for further workup. Patient may warrant wound care at a later time; however, an oncology workup is of the utmost importance.   4. Inguinal lymphadenopathy - Ambulatory referral to Oncology  5. Need for Tdap vaccination - Tdap vaccine greater than or equal to 7yo IM  6. Immunization due - Pneumococcal polysaccharide vaccine 23-valent greater than or equal to 2yo subcutaneous/IM  7. Anemia, unspecified type - CBC  8. Hypokalemia -  Basic Metabolic Panel  9. Essential hypertension - amLODipine (NORVASC) 10 MG tablet; Take 1 tablet (10 mg total) by mouth daily.  Dispense: 30 tablet; Refill: 5   RTC: 1 month for follow up of hypertension  Greater than 50% of visit spent discussing diagnosis, referral, and laboratory results    Donia Pounds  MSN, FNP-C [facility] [address] [place], [place] [address] [phone]

## 2016-11-29 LAB — BASIC METABOLIC PANEL
BUN: 13 mg/dL (ref 7–25)
CALCIUM: 9.9 mg/dL (ref 8.6–10.3)
CO2: 25 mmol/L (ref 20–32)
CREATININE: 1.11 mg/dL (ref 0.60–1.35)
Chloride: 106 mmol/L (ref 98–110)
GLUCOSE: 88 mg/dL (ref 65–99)
Potassium: 4.4 mmol/L (ref 3.5–5.3)
Sodium: 142 mmol/L (ref 135–146)

## 2016-11-30 ENCOUNTER — Encounter: Payer: Self-pay | Admitting: Family Medicine

## 2016-11-30 NOTE — Progress Notes (Signed)
Patient has appointment scheduled with Dr. Nicolette Bang, urologist on 11/30/2016 at 7:45 am for further evaluation of inguinal lymphadenopathy and penile ulcer.  Will follow up in clinic with Mr. Noblet as scheduled.   Ronald Pounds  MSN, FNP-C [facility] [address] [place], [place] [address] [phone]

## 2016-12-07 ENCOUNTER — Encounter: Payer: Self-pay | Admitting: Oncology

## 2016-12-16 ENCOUNTER — Ambulatory Visit (HOSPITAL_BASED_OUTPATIENT_CLINIC_OR_DEPARTMENT_OTHER): Payer: Self-pay | Admitting: Oncology

## 2016-12-16 ENCOUNTER — Telehealth: Payer: Self-pay | Admitting: Oncology

## 2016-12-16 VITALS — BP 121/74 | HR 126 | Temp 99.6°F | Resp 20 | Ht 69.0 in | Wt 198.9 lb

## 2016-12-16 DIAGNOSIS — C7989 Secondary malignant neoplasm of other specified sites: Secondary | ICD-10-CM

## 2016-12-16 DIAGNOSIS — Z7189 Other specified counseling: Secondary | ICD-10-CM

## 2016-12-16 DIAGNOSIS — C609 Malignant neoplasm of penis, unspecified: Secondary | ICD-10-CM

## 2016-12-16 DIAGNOSIS — D573 Sickle-cell trait: Secondary | ICD-10-CM

## 2016-12-16 DIAGNOSIS — I10 Essential (primary) hypertension: Secondary | ICD-10-CM

## 2016-12-16 MED ORDER — PROCHLORPERAZINE MALEATE 10 MG PO TABS: 10.0000 mg | ORAL_TABLET | Freq: Four times a day (QID) | ORAL | 0 refills | Status: DC | PRN

## 2016-12-16 MED ORDER — LIDOCAINE-PRILOCAINE 2.5-2.5 % EX CREA: 1.0000 "application " | TOPICAL_CREAM | CUTANEOUS | 0 refills | Status: AC | PRN

## 2016-12-16 NOTE — Telephone Encounter (Signed)
Gave patient avs and calendar for September appointments

## 2016-12-16 NOTE — Progress Notes (Signed)
START OFF PATHWAY REGIMEN - [Other Dx]   [id number]:Cisplatin + 5-Fluorouracil (80/1000):   A cycle is every 28 days:     Cisplatin      5-Fluorouracil   **Always confirm dose/schedule in your pharmacy ordering system**    Patient Characteristics: Intent of Therapy: Non-Curative / Palliative Intent, Discussed with Patient

## 2016-12-16 NOTE — Progress Notes (Signed)
Reason for Referral: Penile cancer.   HPI: 41 year old pleasant gentleman currently of [place] where he lived the majority of his life. He has history of hypertension as well as sickle cell trait but for the most part reasonably good health. He was seen in the emergency department on 11/17/2016 with symptoms of progressive lower extremity edema predominantly on the left. He did have ultrasound Doppler which was negative for DVT. He has been down diagnosed with balanitis in October 2017. His evaluation revealed bulky pelvic adenopathy as well as inguinal adenopathy. He had a CT scan of the abdomen and pelvis on 11/17/2016 which confirmed these findings. He subsequently underwent a ultrasound guided biopsy on 11/22/2016. The biopsy confirmed the diagnosis of poorly differentiated carcinoma likely arising from a squamous cell carcinoma of the penis. He was evaluated by Dr. Alyson Ingles from urology and felt that no primary surgical therapy is needed. Clinically, he reports no other complaints. He denied any weight loss or appetite changes. He denied any other skin rashes or lesions. He denied any hematochezia or melena. He denied any difficulty urination. He still active and attending to her activities of daily living also he is to caregiver for his father.  He does not report any headaches, blurry vision, syncope or seizures. He does not report any fevers, chills or sweats. He does not report any cough, wheezing or hemoptysis. He does not report any nausea vomiting or abdominal pain. He does not report any constipation, diarrhea or over the satiety. He does not report any chest pain, palpitation, orthopnea. He does not report any hematochezia or melena. He does not report any hematuria or dysuria. Remaining review of systems unremarkable.   Past Medical History:  Diagnosis Date  . Inguinal mass 11/17/2016   left  . Sickle cell trait ([place])   :  Past Surgical History:  Procedure Laterality Date  . NO  PAST SURGERIES    :   Current Outpatient Prescriptions:  .  amLODipine (NORVASC) 10 MG tablet, Take 1 tablet (10 mg total) by mouth daily., Disp: 30 tablet, Rfl: 5:  No Known Allergies:  Family History  Problem Relation Age of Onset  . Hypertension Mother   :  Social History   Social History  . Marital status: Single    Spouse name: N/A  . Number of children: N/A  . Years of education: N/A   Occupational History  . Not on file.   Social History Main Topics  . Smoking status: Current Every Day Smoker    Packs/day: 0.25    Years: 25.00    Types: Cigarettes  . Smokeless tobacco: Never Used  . Alcohol use 9.0 oz/week    15 Shots of liquor per week     Comment: "11/17/2016 "I'll have shots ~ 3 days/wk"  . Drug use: Yes    Types: Marijuana     Comment: 11/17/2016 "daily"  . Sexual activity: Yes    Birth control/ protection: Condom   Other Topics Concern  . Not on file   Social History Narrative  . No narrative on file  :  Pertinent items are noted in HPI.  Exam: Blood pressure 121/74, pulse (!) 126, temperature 99.6 F (37.6 C), temperature source Oral, resp. rate 20, height 5\' 9"  (1.753 m), weight 198 lb 14.4 oz (90.2 kg), SpO2 100 %.  ECOG 0  General appearance: alert and cooperative appeared without distress. Throat: No oral thrush. Neck: no adenopathy Back: negative without any flank tenderness. Chest wall: no tenderness Lungs:  Clear to auscultation without rhonchi, wheezes or dullness to percussion. Cardio: regular rate and rhythm, S1, S2 normal, no murmur, click, rub or gallop GI: soft, non-tender; bowel sounds normal; no masses,  no organomegaly Extremities: Left leg edema noted. Skin: Skin color, texture, turgor normal. No rashes or lesions Lymph nodes: Palpable left inguinal adenopathy.   CBC    Component Value Date/Time   WBC 9.0 11/28/2016 1044   RBC 4.30 11/28/2016 1044   HGB 12.2 (L) 11/28/2016 1044   HCT 38.2 (L) 11/28/2016 1044   PLT 395  11/28/2016 1044   MCV 88.8 11/28/2016 1044   MCH 28.4 11/28/2016 1044   MCHC 31.9 (L) 11/28/2016 1044   RDW 13.2 11/28/2016 1044   LYMPHSABS 1.9 11/18/2016 0326   MONOABS 0.8 11/18/2016 0326   EOSABS 0.2 11/18/2016 0326   BASOSABS 0.1 11/18/2016 0326     Chemistry      Component Value Date/Time   NA 142 11/28/2016 1044   K 4.4 11/28/2016 1044   CL 106 11/28/2016 1044   CO2 25 11/28/2016 1044   BUN 13 11/28/2016 1044   CREATININE 1.11 11/28/2016 1044      Component Value Date/Time   CALCIUM 9.9 11/28/2016 1044   ALKPHOS 64 11/18/2016 0326   AST 21 11/18/2016 0326   ALT 17 11/18/2016 0326   BILITOT 0.3 11/18/2016 0326        Ct Abdomen Pelvis W Contrast  Result Date: 11/17/2016 CLINICAL DATA:  Left lower quadrant and groin pain. EXAM: CT ABDOMEN AND PELVIS WITH CONTRAST TECHNIQUE: Multidetector CT imaging of the abdomen and pelvis was performed using the standard protocol following bolus administration of intravenous contrast. CONTRAST:  111mL ISOVUE-300 IOPAMIDOL (ISOVUE-300) INJECTION 61% COMPARISON:  None. FINDINGS: Lower chest: Lung bases are within normal. Hepatobiliary: Within normal. Pancreas: Within normal. Spleen: Within normal. Adrenals/Urinary Tract: Adrenal glands are normal. Kidneys normal in size without hydronephrosis or nephrolithiasis. There is a 1.2 cm cyst over the mid to lower pole right kidney. Ureters and bladder are normal. Stomach/Bowel: Stomach and small bowel are within normal. Appendix is normal. Minimal diverticulosis of the colon. Vascular/Lymphatic: Minimal calcified plaque over the abdominal aorta. There is a prominent heterogeneous solid and cystic mass with subtle areas of calcification over the left superficial inguinal region. This likely represents adenopathy and measures 8.1 cm in diameter. There are adjacent similar appearing smaller left inguinal lymph nodes. There are similar low-density partially calcified left iliac chain lymph nodes with the  largest measuring 6.2 cm in greatest diameter. There is adenopathy in the distal left periaortic region with largest node measuring 2.3 cm by short axis. Reproductive: Within normal. Other: Mild subcutaneous edema over the left inguinal region and left upper leg. Musculoskeletal: Within normal. IMPRESSION: Findings compatible with significant heterogeneous left inguinal and left iliac chain adenopathy with lesser adenopathy in the periaortic region. Mild associated subcutaneous edema over the left inguinal region and upper leg. Findings are likely due to a neoplastic process, although an infectious/granulomatous process is not excluded. Recommend clinical correlation and diagnostic tissue sampling for further evaluation. 1.2 cm right renal cyst. Mild colonic diverticulosis. Aortic Atherosclerosis (ICD10-I70.0). Electronically Signed   By: Marin Olp M.D.   On: 11/17/2016 17:25      Assessment and Plan:    41 year old gentleman with the following issues:  1. 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 This was diagnosed in August 2018 after presenting with left lower extremity edema. He  has rather bulky disease no primary surgical therapy is indicated.  The natural course of this disease was reviewed today with the patient as well as treatment approach. I do not believe his disease is curable at this time and any treatment moving forward is palliative. He is young with excellent performance status and will require aggressive therapy. I see no role for radiation therapy at this time given the systemic disease involvement. I will like to obtain a CT scan of the chest to complete the staging.  Chemotherapy regimens were reviewed today and the predominantly uses cisplatinum based regimen and 5-FU. Complications associated with this chemotherapy include nausea, vomiting, myelosuppression, neutropenia, neutropenic sepsis, electrolyte  imbalance, hearing deficits, neuropathy, deep vein thrombosis and rarely severe illness, hospitalization and death.  After discussion today he is agreeable to proceed after chemotherapy education class.  2. IV access: Risks and benefits of Port-A-Cath insertion were reviewed today. Complications include bleeding, thrombosis and infection. He is agreeable to proceed at this time. EMLA cream will be prescribed to him.  3. Antiemetics: Prescription for Compazine will be made available to the patient.  4. Renal function surveillance: His baseline kidney function is normal and we'll continue to monitor periodically.  5. Sickle cell trait: Baseline hemoglobin is normal at this time. We'll continue to monitor.  6. Follow-up: Will be in the next few weeks to start systemic chemotherapy.

## 2016-12-19 ENCOUNTER — Encounter: Payer: Self-pay | Admitting: Oncology

## 2016-12-19 ENCOUNTER — Other Ambulatory Visit: Payer: Self-pay

## 2016-12-19 MED FILL — PROCHLORPERAZINE 10 MG TAB: 10 | 7 days supply | Qty: 30 | Fill #0

## 2016-12-19 MED FILL — LIDOCAINE-PRILOCAINE CREAM: 2.5-2.5 | 10 days supply | Qty: 30 | Fill #0

## 2016-12-19 NOTE — Progress Notes (Signed)
Met with uninsured patient referred by Aleen Sells.  Introduced myself as Arboriculturist and asked patient if he had received his oral medications. Patient states no but should be able to get them soon.   Approved patient for one-time $400 [place]. Patient has a copy of the approval as well as the information to the [facility] OP pharmacy.  I explained the funds would be reserved for medications only for right now and may be later used for other things such as gas cards.  I called [facility] pharmacy to have his prescriptions transferred from [facility] to them. Patient will pick them up tomorrow using the grant.     Patient verbalized understanding. He has my card for any additional financial questions or concerns.

## 2016-12-22 ENCOUNTER — Encounter: Payer: Self-pay | Admitting: Pharmacy Technician

## 2016-12-22 NOTE — Progress Notes (Signed)
The patient is approved for drug assistance by [employer] for [employer]. Coverage is from 12/21/16 - 12/21/17. The first DOS covered is 01/02/17. The patient also has a pending application to [employer] for Neulasta.

## 2016-12-23 ENCOUNTER — Ambulatory Visit (HOSPITAL_COMMUNITY)
Admission: RE | Admit: 2016-12-23 | Discharge: 2016-12-23 | Disposition: A | Discharge status: Home / Self Care | Payer: Medicaid Other | Source: Ambulatory Visit | Attending: Oncology | Admitting: Oncology

## 2016-12-23 ENCOUNTER — Other Ambulatory Visit: Payer: Self-pay | Admitting: Radiology

## 2016-12-23 DIAGNOSIS — Z7189 Other specified counseling: Secondary | ICD-10-CM | POA: Diagnosis present

## 2016-12-23 DIAGNOSIS — C609 Malignant neoplasm of penis, unspecified: Secondary | ICD-10-CM

## 2016-12-23 MED ORDER — IOPAMIDOL (ISOVUE-300) INJECTION 61%
75.0000 mL | Freq: Once | INTRAVENOUS | Status: AC | PRN
  Administered 2016-12-23: 75 mL via INTRAVENOUS

## 2016-12-26 ENCOUNTER — Other Ambulatory Visit: Payer: Self-pay | Admitting: Oncology

## 2016-12-26 ENCOUNTER — Ambulatory Visit (HOSPITAL_COMMUNITY)
Admission: RE | Admit: 2016-12-26 | Discharge: 2016-12-26 | Disposition: A | Discharge status: Home / Self Care | Payer: Medicaid Other | Source: Ambulatory Visit | Attending: Oncology | Admitting: Oncology

## 2016-12-26 ENCOUNTER — Encounter (HOSPITAL_COMMUNITY): Payer: Self-pay

## 2016-12-26 DIAGNOSIS — C609 Malignant neoplasm of penis, unspecified: Secondary | ICD-10-CM

## 2016-12-26 DIAGNOSIS — I10 Essential (primary) hypertension: Secondary | ICD-10-CM | POA: Diagnosis not present

## 2016-12-26 DIAGNOSIS — D573 Sickle-cell trait: Secondary | ICD-10-CM | POA: Insufficient documentation

## 2016-12-26 DIAGNOSIS — Z7189 Other specified counseling: Secondary | ICD-10-CM

## 2016-12-26 DIAGNOSIS — F17210 Nicotine dependence, cigarettes, uncomplicated: Secondary | ICD-10-CM | POA: Diagnosis not present

## 2016-12-26 HISTORY — PX: IR FLUORO GUIDE PORT INSERTION RIGHT: IMG5741

## 2016-12-26 HISTORY — PX: IR US GUIDE VASC ACCESS RIGHT: IMG2390

## 2016-12-26 HISTORY — DX: Essential (primary) hypertension: I10

## 2016-12-26 LAB — CBC WITH DIFFERENTIAL/PLATELET
Basophils Absolute: 0 10*3/uL (ref 0.0–0.1)
Basophils Relative: 0 %
EOS ABS: 0.1 10*3/uL (ref 0.0–0.7)
EOS PCT: 1 %
HCT: 32.3 % — ABNORMAL LOW (ref 39.0–52.0)
Hemoglobin: 11 g/dL — ABNORMAL LOW (ref 13.0–17.0)
Lymphocytes Relative: 13 %
Lymphs Abs: 1.7 10*3/uL (ref 0.7–4.0)
MCH: 27.5 pg (ref 26.0–34.0)
MCHC: 34.1 g/dL (ref 30.0–36.0)
MCV: 80.8 fL (ref 78.0–100.0)
MONOS PCT: 7 %
Monocytes Absolute: 1 10*3/uL (ref 0.1–1.0)
Neutro Abs: 10.2 10*3/uL — ABNORMAL HIGH (ref 1.7–7.7)
Neutrophils Relative %: 79 %
PLATELETS: 486 10*3/uL — AB (ref 150–400)
RBC: 4 MIL/uL — ABNORMAL LOW (ref 4.22–5.81)
RDW: 12.9 % (ref 11.5–15.5)
WBC: 13 10*3/uL — AB (ref 4.0–10.5)

## 2016-12-26 LAB — PROTIME-INR
INR: 0.92
PROTHROMBIN TIME: 12.3 s (ref 11.4–15.2)

## 2016-12-26 LAB — FUNGUS CULTURE WITH STAIN

## 2016-12-26 LAB — FUNGAL ORGANISM REFLEX

## 2016-12-26 LAB — FUNGUS CULTURE RESULT

## 2016-12-26 MED ORDER — CEFAZOLIN SODIUM-DEXTROSE 2-4 GM/100ML-% IV SOLN
2.0000 g | INTRAVENOUS | Status: AC
  Administered 2016-12-26: 2 g via INTRAVENOUS

## 2016-12-26 MED ORDER — HEPARIN SOD (PORK) LOCK FLUSH 100 UNIT/ML IV SOLN
INTRAVENOUS | Status: AC
  Filled 2016-12-26: qty 5

## 2016-12-26 MED ORDER — HEPARIN SOD (PORK) LOCK FLUSH 100 UNIT/ML IV SOLN
INTRAVENOUS | Status: AC | PRN
  Administered 2016-12-26: 500 [IU] via INTRAVENOUS

## 2016-12-26 MED ORDER — LIDOCAINE HCL (PF) 1 % IJ SOLN
INTRAMUSCULAR | Status: AC | PRN
  Administered 2016-12-26: 20 mL via SUBCUTANEOUS

## 2016-12-26 MED ORDER — MIDAZOLAM HCL 2 MG/2ML IJ SOLN
INTRAMUSCULAR | Status: AC | PRN
  Administered 2016-12-26 (×2): 1 mg via INTRAVENOUS

## 2016-12-26 MED ORDER — SODIUM CHLORIDE 0.9 % IV SOLN: INTRAVENOUS | Status: DC

## 2016-12-26 MED ORDER — CEFAZOLIN SODIUM-DEXTROSE 2-4 GM/100ML-% IV SOLN
INTRAVENOUS | Status: AC
  Administered 2016-12-26: 2 g via INTRAVENOUS
  Filled 2016-12-26: qty 100

## 2016-12-26 MED ORDER — FENTANYL CITRATE (PF) 100 MCG/2ML IJ SOLN
INTRAMUSCULAR | Status: AC | PRN
  Administered 2016-12-26 (×2): 50 ug via INTRAVENOUS

## 2016-12-26 MED ORDER — FENTANYL CITRATE (PF) 100 MCG/2ML IJ SOLN
INTRAMUSCULAR | Status: AC
  Filled 2016-12-26: qty 4

## 2016-12-26 MED ORDER — MIDAZOLAM HCL 2 MG/2ML IJ SOLN
INTRAMUSCULAR | Status: AC
  Filled 2016-12-26: qty 4

## 2016-12-26 NOTE — Discharge Instructions (Signed)
Moderate Conscious Sedation, Adult, Care After These instructions provide you with information about caring for yourself after your procedure. Your health care provider may also give you more specific instructions. Your treatment has been planned according to current medical practices, but problems sometimes occur. Call your health care provider if you have any problems or questions after your procedure. What can I expect after the procedure? After your procedure, it is common:  To feel sleepy for several hours.  To feel clumsy and have poor balance for several hours.  To have poor judgment for several hours.  To vomit if you eat too soon.  Follow these instructions at home: For at least 24 hours after the procedure:   Do not: ? Participate in activities where you could fall or become injured. ? Drive. ? Use heavy machinery. ? Drink alcohol. ? Take sleeping pills or medicines that cause drowsiness. ? Make important decisions or sign legal documents. ? Take care of children on your own.  Rest. Eating and drinking  Follow the diet recommended by your health care provider.  If you vomit: ? Drink water, juice, or soup when you can drink without vomiting. ? Make sure you have little or no nausea before eating solid foods. General instructions  Have a responsible adult stay with you until you are awake and alert.  Take over-the-counter and prescription medicines only as told by your health care provider.  If you smoke, do not smoke without supervision.  Keep all follow-up visits as told by your health care provider. This is important. Contact a health care provider if:  You keep feeling nauseous or you keep vomiting.  You feel light-headed.  You develop a rash.  You have a fever. Get help right away if:  You have trouble breathing. This information is not intended to replace advice given to you by your health care provider. Make sure you discuss any questions you have  with your health care provider. Document Released: 01/16/2013 Document Revised: 08/31/2015 Document Reviewed: 07/18/2015 Elsevier Interactive Patient Education  2018 [place] An implanted port is a type of central line that is placed under the skin. Central lines are used to provide IV access when treatment or nutrition needs to be given through a persons veins. Implanted ports are used for long-term IV access. An implanted port may be placed because:  You need IV medicine that would be irritating to the small veins in your hands or arms.  You need long-term IV medicines, such as antibiotics.  You need IV nutrition for a long period.  You need frequent blood draws for lab tests.  You need dialysis.  Implanted ports are usually placed in the chest area, but they can also be placed in the upper arm, the abdomen, or the leg. An implanted port has two main parts:  Reservoir. The reservoir is round and will appear as a small, raised area under your skin. The reservoir is the part where a needle is inserted to give medicines or draw blood.  Catheter. The catheter is a thin, flexible tube that extends from the reservoir. The catheter is placed into a large vein. Medicine that is inserted into the reservoir goes into the catheter and then into the vein.  How will I care for my incision site? Do not get the incision site wet. Bathe or shower as directed by your health care provider. How is my port accessed? Special steps must be taken to access the port:  Before the port is accessed, a numbing cream can be placed on the skin. This helps numb the skin over the port site.  Your health care provider uses a sterile technique to access the port. ? Your health care provider must put on a mask and sterile gloves. ? The skin over your port is cleaned carefully with an antiseptic and allowed to dry. ? The port is gently pinched between sterile gloves, and a needle is  inserted into the port.  Only "non-coring" port needles should be used to access the port. Once the port is accessed, a blood return should be checked. This helps ensure that the port is in the vein and is not clogged.  If your port needs to remain accessed for a constant infusion, a clear (transparent) bandage will be placed over the needle site. The bandage and needle will need to be changed every week, or as directed by your health care provider.  Keep the bandage covering the needle clean and dry. Do not get it wet. Follow your health care providers instructions on how to take a shower or bath while the port is accessed.  If your port does not need to stay accessed, no bandage is needed over the port.  What is flushing? Flushing helps keep the port from getting clogged. Follow your health care providers instructions on how and when to flush the port. Ports are usually flushed with saline solution or a medicine called heparin. The need for flushing will depend on how the port is used.  If the port is used for intermittent medicines or blood draws, the port will need to be flushed: ? After medicines have been given. ? After blood has been drawn. ? As part of routine maintenance.  If a constant infusion is running, the port may not need to be flushed.  How long will my port stay implanted? The port can stay in for as long as your health care provider thinks it is needed. When it is time for the port to come out, surgery will be done to remove it. The procedure is similar to the one performed when the port was put in. When should I seek immediate medical care? When you have an implanted port, you should seek immediate medical care if:  You notice a bad smell coming from the incision site.  You have swelling, redness, or drainage at the incision site.  You have more swelling or pain at the port site or the surrounding area.  You have a fever that is not controlled with  medicine.  This information is not intended to replace advice given to you by your health care provider. Make sure you discuss any questions you have with your health care provider. Document Released: 03/28/2005 Document Revised: 09/03/2015 Document Reviewed: 12/03/2012 Elsevier Interactive Patient Education  2017 [place] Insertion, Care After This sheet gives you information about how to care for yourself after your procedure. Your health care provider may also give you more specific instructions. If you have problems or questions, contact your health care provider. What can I expect after the procedure? After your procedure, it is common to have:  Discomfort at the port insertion site.  Bruising on the skin over the port. This should improve over 3-4 days.  Follow these instructions at home: [facility] care  After your port is placed, you will get a manufacturer's information card. The card has information about your port. Keep this card with you at  all times.  Take care of the port as told by your health care provider. Ask your health care provider if you or a family member can get training for taking care of the port at home. A home health care nurse may also take care of the port.  Make sure to remember what type of port you have. Incision care  Follow instructions from your health care provider about how to take care of your port insertion site. Make sure you: ? Wash your hands with soap and water before you change your bandage (dressing). If soap and water are not available, use hand sanitizer. ? Change your dressing as told by your health care provider.  You may remove dressing tomorrow 12/27/17.  Leave skin glue in place. These skin closures may need to stay in place for 2 weeks or longer.  Do not use EMLA cream for 2 weeks after port placement as it will remove surgical glue.  Check your port insertion site every day for signs of infection.   Check  for: ? More redness, swelling, or pain. ? More fluid or blood. ? Warmth. ? Pus or a bad smell. General instructions  Do not take baths, swim, or use a hot tub until your health care provider approves.  You may shower tomorrow 12/27/16.  Do not lift anything that is heavier than 10 lb (4.5 kg) for a week, or as told by your health care provider.  Ask your health care provider when it is okay to: ? Return to work or school. ? Resume usual physical activities or sports.  Do not drive for 24 hours if you were given a medicine to help you relax (sedative).  Take over-the-counter and prescription medicines only as told by your health care provider.  Wear a medical alert bracelet in case of an emergency. This will tell any health care providers that you have a port.  Keep all follow-up visits as told by your health care provider. This is important. Contact a health care provider if:  You cannot flush your port with saline as directed, or you cannot draw blood from the port.  You have a fever or chills.  You have more redness, swelling, or pain around your port insertion site.  You have more fluid or blood coming from your port insertion site.  Your port insertion site feels warm to the touch.  You have pus or a bad smell coming from the port insertion site. Get help right away if:  You have chest pain or shortness of breath.  You have bleeding from your port that you cannot control. Summary  Take care of the port as told by your health care provider.  Change your dressing as told by your health care provider.  Keep all follow-up visits as told by your health care provider. This information is not intended to replace advice given to you by your health care provider. Make sure you discuss any questions you have with your health care provider. Document Released: 01/16/2013 Document Revised: 02/17/2016 Document Reviewed: 02/17/2016 Elsevier Interactive Patient Education  2017  [employer].

## 2016-12-26 NOTE — Procedures (Signed)
Interventional Radiology Procedure Note  Procedure: Placement of a right IJ approach single lumen PowerPort.  Tip is positioned at the superior cavoatrial junction and catheter is ready for immediate use.  Complications: No immediate Recommendations:  - Ok to shower tomorrow - Do not submerge for 7 days - Routine line care   Raymond Azure T. Vitaliy Eisenhour, M.D Pager:  [phone]   

## 2016-12-26 NOTE — H&P (Signed)
Chief Complaint: Patient was seen in consultation today for squamous cell carcinoma  Referring Physician(s): Shadad,Firas N  History of Present Illness: Ronald Wilson is a 41 y.o. male with past medical history of HTN and sickle cell trait who presented to [facility] ED with lower extremity swelling.  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  Patient now with plans for palliative chemotherapy.   He presents for Port-A-Cath placement at the request of Dr. Alen Blew.  He has been NPO and does not take blood thinners.  He presents for procedure today in his usual state of health.   Past Medical History:  Diagnosis Date  . Hypertension   . Inguinal mass 11/17/2016   left  . Sickle cell trait [facility])     Past Surgical History:  Procedure Laterality Date  . NO PAST SURGERIES      Allergies: Patient has no known allergies.  Medications: Prior to Admission medications   Medication Sig Start Date End Date Taking? Authorizing Provider  amLODipine (NORVASC) 10 MG tablet Take 1 tablet (10 mg total) by mouth daily. 11/28/16  Yes [name], FNP  lidocaine-prilocaine (EMLA) cream Apply 1 application topically as needed. Apply to porta cath before chemotherapy. 12/16/16   Wyatt Portela, MD  prochlorperazine (COMPAZINE) 10 MG tablet Take 1 tablet (10 mg total) by mouth every 6 (six) hours as needed for nausea or vomiting. 12/16/16   Wyatt Portela, MD     Family History  Problem Relation Age of Onset  . Hypertension Mother     Social History   Social History  . Marital status: Single    Spouse name: N/A  . Number of children: N/A  . Years of education: N/A   Social History Main Topics  . Smoking status: Current Every Day Smoker    Packs/day: 0.25    Years: 25.00    Types: Cigarettes  . Smokeless tobacco: Never Used  . Alcohol use 9.0 oz/week    15 Shots of  liquor per week     Comment: "11/17/2016 "I'll have shots ~ 3 days/wk"  . Drug use: Yes    Types: Marijuana     Comment: 11/17/2016 "daily"  . Sexual activity: Yes    Birth control/ protection: Condom   Other Topics Concern  . None   Social History Narrative  . None    Review of Systems  Constitutional: Negative for fatigue and fever.  Respiratory: Negative for cough and shortness of breath.   Cardiovascular: Negative for chest pain.  Gastrointestinal: Negative for abdominal pain.  Musculoskeletal: Negative for back pain.  Psychiatric/Behavioral: Negative for behavioral problems and confusion.    Vital Signs: Ht 5\' 9"  (1.753 m)   Wt 195 lb (88.5 kg)   BMI 28.80 kg/m   Physical Exam  Constitutional: He is oriented to person, place, and time. He appears well-developed.  Cardiovascular: Normal rate and regular rhythm.   Pulmonary/Chest: Effort normal. No respiratory distress.  Abdominal: Soft.  Neurological: He is alert and oriented to person, place, and time.  Skin: Skin is warm and dry.  Psychiatric: He has a normal mood and affect. His behavior is normal. Judgment and thought content normal.  Nursing note and vitals reviewed.   Mallampati Score:  MD Evaluation Airway: WNL Heart: WNL Abdomen: WNL Chest/ Lungs: WNL ASA  Classification: 3 Mallampati/Airway Score: Two  Imaging: Ct Chest W Contrast  Result Date:  12/23/2016 CLINICAL DATA:  Penile cancer, abnormal left inguinal nodes, for staging EXAM: CT CHEST WITH CONTRAST TECHNIQUE: Multidetector CT imaging of the chest was performed during intravenous contrast administration. CONTRAST:  109mL ISOVUE-300 IOPAMIDOL (ISOVUE-300) INJECTION 61% COMPARISON:  Partial comparison to CT abdomen/ pelvis dated 11/17/2016 FINDINGS: Cardiovascular: The heart is normal in size. No pericardial effusion. No evidence of thoracic aortic aneurysm. Mediastinum/Nodes: No suspicious mediastinal, hilar, or axillary lymphadenopathy. Visualized  thyroid is unremarkable. Lungs/Pleura: No suspicious pulmonary nodules. Mild dependent atelectasis in the bilateral lower lobes. No focal consolidation. No pleural effusion or pneumothorax. Upper Abdomen: Visualized upper abdomen is unremarkable. Musculoskeletal: Mild degenerative changes of the visualized thoracolumbar spine. IMPRESSION: No evidence of metastatic disease in the chest. No evidence of acute cardiopulmonary disease. Electronically Signed   By: Julian Hy M.D.   On: 12/23/2016 10:40    Labs:  CBC:  Recent Labs  11/17/16 1233 11/18/16 0326 11/28/16 1044 12/26/16 1148  WBC 11.3* 9.2 9.0 13.0*  HGB 10.7* 10.8* 12.2* 11.0*  HCT 32.4* 32.1* 38.2* 32.3*  PLT 358 353 395 486*    COAGS:  Recent Labs  11/21/16 0435 12/26/16 1148  INR 0.98 0.92  APTT 31  --     BMP:  Recent Labs  11/17/16 1233 11/18/16 0326 11/19/16 0405 11/28/16 1044  NA 140 142 141 142  K 3.5 3.2* 4.0 4.4  CL 104 106 105 106  CO2 30 29 30 25   GLUCOSE 103* 84 113* 88  BUN 11 7 6 13   CALCIUM 9.4 8.6* 8.9 9.9  CREATININE 1.15 1.17 1.13 1.11  GFRNONAA >60 >60 >60  --   GFRAA >60 >60 >60  --     LIVER FUNCTION TESTS:  Recent Labs  11/18/16 0326  BILITOT 0.3  AST 21  ALT 17  ALKPHOS 64  PROT 6.5  ALBUMIN 3.1*    TUMOR MARKERS: No results for input(s): AFPTM, CEA, CA199, CHROMGRNA in the last 8760 hours.  Assessment and Plan: Patient with past medical history of HTN and sickle cell trait presents with recent diagnosis of metastatic squamous cell carcinoma and plans for upcoming chemotherapy.  IR consulted for Port-A-Cath placement at the request of Dr. Alen Blew. Patient presents today in their usual state of health.  He has been NPO and is not currently on blood thinners.  Risks and benefits discussed with the patient including, but not limited to bleeding, infection, pneumothorax, or fibrin sheath development and need for additional procedures. All of the patient's questions  were answered, patient is agreeable to proceed. Consent signed and in chart.   Thank you for this interesting consult.  I greatly enjoyed meeting Cordarrel Stiefel and look forward to participating in their care.  A copy of this report was sent to the requesting provider on this date.  Electronically Signed: Docia Barrier 12/26/2016, 12:48 PM   I spent a total of    15 Minutes in face to face in clinical consultation, greater than 50% of which was counseling/coordinating care for  Squamous cell carcinoma

## 2016-12-27 ENCOUNTER — Encounter: Payer: Self-pay | Admitting: Pharmacy Technician

## 2016-12-27 NOTE — Progress Notes (Signed)
The patient is approved for drug assistance by [employer] for Neulasta. Coverage is from 12/27/16 - 12/27/17. Enrollment is based on Self-Pay. The first DOS covered is 01/02/17.

## 2017-01-02 ENCOUNTER — Other Ambulatory Visit (HOSPITAL_BASED_OUTPATIENT_CLINIC_OR_DEPARTMENT_OTHER): Payer: Self-pay

## 2017-01-02 ENCOUNTER — Ambulatory Visit (HOSPITAL_BASED_OUTPATIENT_CLINIC_OR_DEPARTMENT_OTHER): Payer: Self-pay

## 2017-01-02 VITALS — BP 150/102 | HR 93 | Temp 98.6°F | Resp 18

## 2017-01-02 DIAGNOSIS — C609 Malignant neoplasm of penis, unspecified: Secondary | ICD-10-CM

## 2017-01-02 DIAGNOSIS — Z5111 Encounter for antineoplastic chemotherapy: Secondary | ICD-10-CM

## 2017-01-02 DIAGNOSIS — Z7189 Other specified counseling: Secondary | ICD-10-CM

## 2017-01-02 LAB — CBC WITH DIFFERENTIAL/PLATELET
BASO%: 0.4 % (ref 0.0–2.0)
BASOS ABS: 0.1 10*3/uL (ref 0.0–0.1)
EOS ABS: 0.2 10*3/uL (ref 0.0–0.5)
EOS%: 1.8 % (ref 0.0–7.0)
HCT: 33.3 % — ABNORMAL LOW (ref 38.4–49.9)
HEMOGLOBIN: 10.9 g/dL — AB (ref 13.0–17.1)
LYMPH%: 15.9 % (ref 14.0–49.0)
MCH: 27.3 pg (ref 27.2–33.4)
MCHC: 32.7 g/dL (ref 32.0–36.0)
MCV: 83.3 fL (ref 79.3–98.0)
MONO#: 0.9 10*3/uL (ref 0.1–0.9)
MONO%: 7.3 % (ref 0.0–14.0)
NEUT%: 74.6 % (ref 39.0–75.0)
NEUTROS ABS: 9.5 10*3/uL — AB (ref 1.5–6.5)
PLATELETS: 427 10*3/uL — AB (ref 140–400)
RBC: 4 10*6/uL — ABNORMAL LOW (ref 4.20–5.82)
RDW: 12.9 % (ref 11.0–14.6)
WBC: 12.8 10*3/uL — AB (ref 4.0–10.3)
lymph#: 2 10*3/uL (ref 0.9–3.3)

## 2017-01-02 LAB — COMPREHENSIVE METABOLIC PANEL
ALBUMIN: 3.2 g/dL — AB (ref 3.5–5.0)
ALK PHOS: 88 U/L (ref 40–150)
ALT: 11 U/L (ref 0–55)
ANION GAP: 10 meq/L (ref 3–11)
AST: 13 U/L (ref 5–34)
BILIRUBIN TOTAL: 0.47 mg/dL (ref 0.20–1.20)
BUN: 9.4 mg/dL (ref 7.0–26.0)
CALCIUM: 9.6 mg/dL (ref 8.4–10.4)
CO2: 27 mEq/L (ref 22–29)
Chloride: 106 mEq/L (ref 98–109)
Creatinine: 1.4 mg/dL — ABNORMAL HIGH (ref 0.7–1.3)
EGFR: 71 mL/min/{1.73_m2} — AB (ref >90)
Glucose: 118 mg/dl (ref 70–140)
Potassium: 3.5 mEq/L (ref 3.5–5.1)
Sodium: 143 mEq/L (ref 136–145)
TOTAL PROTEIN: 7.5 g/dL (ref 6.4–8.3)

## 2017-01-02 MED ORDER — SODIUM CHLORIDE 0.9 % IV SOLN
1000.0000 mg/m2/d | INTRAVENOUS | Status: DC
  Administered 2017-01-02: 8400 mg via INTRAVENOUS
  Filled 2017-01-02: qty 168

## 2017-01-02 MED ORDER — POTASSIUM CHLORIDE 2 MEQ/ML IV SOLN
Freq: Once | INTRAVENOUS | Status: AC
  Administered 2017-01-02: 11:00:00 via INTRAVENOUS
  Filled 2017-01-02: qty 10

## 2017-01-02 MED ORDER — SODIUM CHLORIDE 0.9 % IV SOLN
80.0000 mg/m2 | Freq: Once | INTRAVENOUS | Status: AC
  Administered 2017-01-02: 168 mg via INTRAVENOUS
  Filled 2017-01-02: qty 168

## 2017-01-02 MED ORDER — SODIUM CHLORIDE 0.9 % IV SOLN
Freq: Once | INTRAVENOUS | Status: AC
  Administered 2017-01-02: 14:00:00 via INTRAVENOUS
  Filled 2017-01-02: qty 5

## 2017-01-02 MED ORDER — SODIUM CHLORIDE 0.9 % IV SOLN
Freq: Once | INTRAVENOUS | Status: AC
  Administered 2017-01-02: 11:00:00 via INTRAVENOUS

## 2017-01-02 MED ORDER — PALONOSETRON HCL INJECTION 0.25 MG/5ML
0.2500 mg | Freq: Once | INTRAVENOUS | Status: AC
Start: 2017-01-02 — End: 2017-01-02
  Administered 2017-01-02: 0.25 mg via INTRAVENOUS

## 2017-01-02 MED ORDER — PALONOSETRON HCL INJECTION 0.25 MG/5ML
INTRAVENOUS | Status: AC
  Filled 2017-01-02: qty 5

## 2017-01-02 NOTE — Progress Notes (Signed)
MD [facility] aware of pt's BP of 150/102, gave verbal order to proceed w/tx, read back by RN Kathrynn Humble.

## 2017-01-02 NOTE — Patient Instructions (Addendum)
[facility] Discharge Instructions for Patients Receiving Chemotherapy  Today you received the following chemotherapy agents: Cisplatin & Fluorouacil. Return at 4pm on Friday to have your pump discontinued.  To help prevent nausea and vomiting after your treatment, we encourage you to take your nausea medication as prescribed by your doctor..  You may use compazine/prochlorperazine for nausea as directed.   If you develop nausea and vomiting that is not controlled by your nausea medication, call the clinic.   BELOW ARE SYMPTOMS THAT SHOULD BE REPORTED IMMEDIATELY:  *FEVER GREATER THAN 100.5 F  *CHILLS WITH OR WITHOUT FEVER  NAUSEA AND VOMITING THAT IS NOT CONTROLLED WITH YOUR NAUSEA MEDICATION  *UNUSUAL SHORTNESS OF BREATH  *UNUSUAL BRUISING OR BLEEDING  TENDERNESS IN MOUTH AND THROAT WITH OR WITHOUT PRESENCE OF ULCERS  *URINARY PROBLEMS  *BOWEL PROBLEMS  UNUSUAL RASH Items with * indicate a potential emergency and should be followed up as soon as possible.  Feel free to call the clinic should you have any questions or concerns. The clinic phone number is [phone].  Please show the [facility] at check-in to the Emergency Department and triage nurse.    Cisplatin injection What is this medicine? CISPLATIN (SIS pla tin) is a chemotherapy drug. It targets fast dividing cells, like cancer cells, and causes these cells to die. This medicine is used to treat many types of cancer like bladder, ovarian, and testicular cancers. This medicine may be used for other purposes; ask your health care provider or pharmacist if you have questions. COMMON BRAND NAME(S): Platinol, Platinol -AQ What should I tell my health care provider before I take this medicine? They need to know if you have any of these conditions: -blood disorders -hearing problems -kidney disease -recent or ongoing radiation therapy -an unusual or allergic reaction to cisplatin, carboplatin,  other chemotherapy, other medicines, foods, dyes, or preservatives -pregnant or trying to get pregnant -breast-feeding How should I use this medicine? This drug is given as an infusion into a vein. It is administered in a hospital or clinic by a specially trained health care professional. Talk to your pediatrician regarding the use of this medicine in children. Special care may be needed. Overdosage: If you think you have taken too much of this medicine contact a poison control center or emergency room at once. NOTE: This medicine is only for you. Do not share this medicine with others. What if I miss a dose? It is important not to miss a dose. Call your doctor or health care professional if you are unable to keep an appointment. What may interact with this medicine? -dofetilide -foscarnet -medicines for seizures -medicines to increase blood counts like filgrastim, pegfilgrastim, sargramostim -probenecid -pyridoxine used with altretamine -rituximab -some antibiotics like amikacin, gentamicin, neomycin, polymyxin B, streptomycin, tobramycin -sulfinpyrazone -vaccines -zalcitabine Talk to your doctor or health care professional before taking any of these medicines: -acetaminophen -aspirin -ibuprofen -ketoprofen -naproxen This list may not describe all possible interactions. Give your health care provider a list of all the medicines, herbs, non-prescription drugs, or dietary supplements you use. Also tell them if you smoke, drink alcohol, or use illegal drugs. Some items may interact with your medicine. What should I watch for while using this medicine? Your condition will be monitored carefully while you are receiving this medicine. You will need important blood work done while you are taking this medicine. This drug may make you feel generally unwell. This is not uncommon, as chemotherapy can affect healthy cells  as well as cancer cells. Report any side effects. Continue your course of  treatment even though you feel ill unless your doctor tells you to stop. In some cases, you may be given additional medicines to help with side effects. Follow all directions for their use. Call your doctor or health care professional for advice if you get a fever, chills or sore throat, or other symptoms of a cold or flu. Do not treat yourself. This drug decreases your body's ability to fight infections. Try to avoid being around people who are sick. This medicine may increase your risk to bruise or bleed. Call your doctor or health care professional if you notice any unusual bleeding. Be careful brushing and flossing your teeth or using a toothpick because you may get an infection or bleed more easily. If you have any dental work done, tell your dentist you are receiving this medicine. Avoid taking products that contain aspirin, acetaminophen, ibuprofen, naproxen, or ketoprofen unless instructed by your doctor. These medicines may hide a fever. Do not become pregnant while taking this medicine. Women should inform their doctor if they wish to become pregnant or think they might be pregnant. There is a potential for serious side effects to an unborn child. Talk to your health care professional or pharmacist for more information. Do not breast-feed an infant while taking this medicine. Drink fluids as directed while you are taking this medicine. This will help protect your kidneys. Call your doctor or health care professional if you get diarrhea. Do not treat yourself. What side effects may I notice from receiving this medicine? Side effects that you should report to your doctor or health care professional as soon as possible: -allergic reactions like skin rash, itching or hives, swelling of the face, lips, or tongue -signs of infection - fever or chills, cough, sore throat, pain or difficulty passing urine -signs of decreased platelets or bleeding - bruising, pinpoint red spots on the skin, black,  tarry stools, nosebleeds -signs of decreased red blood cells - unusually weak or tired, fainting spells, lightheadedness -breathing problems -changes in hearing -gout pain -low blood counts - This drug may decrease the number of white blood cells, red blood cells and platelets. You may be at increased risk for infections and bleeding. -nausea and vomiting -pain, swelling, redness or irritation at the injection site -pain, tingling, numbness in the hands or feet -problems with balance, movement -trouble passing urine or change in the amount of urine Side effects that usually do not require medical attention (report to your doctor or health care professional if they continue or are bothersome): -changes in vision -loss of appetite -metallic taste in the mouth or changes in taste This list may not describe all possible side effects. Call your doctor for medical advice about side effects. You may report side effects to FDA at 1-800-FDA-1088. Where should I keep my medicine? This drug is given in a hospital or clinic and will not be stored at home. NOTE: This sheet is a summary. It may not cover all possible information. If you have questions about this medicine, talk to your doctor, pharmacist, or health care provider.  2018 Elsevier/Gold Standard (2007-07-03 14:40:54)   Fluorouracil, 5-FU injection What is this medicine? FLUOROURACIL, 5-FU (flure oh YOOR a sil) is a chemotherapy drug. It slows the growth of cancer cells. This medicine is used to treat many types of cancer like breast cancer, colon or rectal cancer, pancreatic cancer, and stomach cancer. This medicine may be used  for other purposes; ask your health care provider or pharmacist if you have questions. COMMON BRAND NAME(S): Adrucil What should I tell my health care provider before I take this medicine? They need to know if you have any of these conditions: -blood disorders -dihydropyrimidine dehydrogenase (DPD)  deficiency -infection (especially a virus infection such as chickenpox, cold sores, or herpes) -kidney disease -liver disease -malnourished, poor nutrition -recent or ongoing radiation therapy -an unusual or allergic reaction to fluorouracil, other chemotherapy, other medicines, foods, dyes, or preservatives -pregnant or trying to get pregnant -breast-feeding How should I use this medicine? This drug is given as an infusion or injection into a vein. It is administered in a hospital or clinic by a specially trained health care professional. Talk to your pediatrician regarding the use of this medicine in children. Special care may be needed. Overdosage: If you think you have taken too much of this medicine contact a poison control center or emergency room at once. NOTE: This medicine is only for you. Do not share this medicine with others. What if I miss a dose? It is important not to miss your dose. Call your doctor or health care professional if you are unable to keep an appointment. What may interact with this medicine? -allopurinol -cimetidine -dapsone -digoxin -hydroxyurea -leucovorin -levamisole -medicines for seizures like ethotoin, fosphenytoin, phenytoin -medicines to increase blood counts like filgrastim, pegfilgrastim, sargramostim -medicines that treat or prevent blood clots like warfarin, enoxaparin, and dalteparin -methotrexate -metronidazole -pyrimethamine -some other chemotherapy drugs like busulfan, cisplatin, estramustine, vinblastine -trimethoprim -trimetrexate -vaccines Talk to your doctor or health care professional before taking any of these medicines: -acetaminophen -aspirin -ibuprofen -ketoprofen -naproxen This list may not describe all possible interactions. Give your health care provider a list of all the medicines, herbs, non-prescription drugs, or dietary supplements you use. Also tell them if you smoke, drink alcohol, or use illegal drugs. Some items  may interact with your medicine. What should I watch for while using this medicine? Visit your doctor for checks on your progress. This drug may make you feel generally unwell. This is not uncommon, as chemotherapy can affect healthy cells as well as cancer cells. Report any side effects. Continue your course of treatment even though you feel ill unless your doctor tells you to stop. In some cases, you may be given additional medicines to help with side effects. Follow all directions for their use. Call your doctor or health care professional for advice if you get a fever, chills or sore throat, or other symptoms of a cold or flu. Do not treat yourself. This drug decreases your body's ability to fight infections. Try to avoid being around people who are sick. This medicine may increase your risk to bruise or bleed. Call your doctor or health care professional if you notice any unusual bleeding. Be careful brushing and flossing your teeth or using a toothpick because you may get an infection or bleed more easily. If you have any dental work done, tell your dentist you are receiving this medicine. Avoid taking products that contain aspirin, acetaminophen, ibuprofen, naproxen, or ketoprofen unless instructed by your doctor. These medicines may hide a fever. Do not become pregnant while taking this medicine. Women should inform their doctor if they wish to become pregnant or think they might be pregnant. There is a potential for serious side effects to an unborn child. Talk to your health care professional or pharmacist for more information. Do not breast-feed an infant while taking this medicine. Men  should inform their doctor if they wish to father a child. This medicine may lower sperm counts. Do not treat diarrhea with over the counter products. Contact your doctor if you have diarrhea that lasts more than 2 days or if it is severe and watery. This medicine can make you more sensitive to the sun. Keep out  of the sun. If you cannot avoid being in the sun, wear protective clothing and use sunscreen. Do not use sun lamps or tanning beds/booths. What side effects may I notice from receiving this medicine? Side effects that you should report to your doctor or health care professional as soon as possible: -allergic reactions like skin rash, itching or hives, swelling of the face, lips, or tongue -low blood counts - this medicine may decrease the number of white blood cells, red blood cells and platelets. You may be at increased risk for infections and bleeding. -signs of infection - fever or chills, cough, sore throat, pain or difficulty passing urine -signs of decreased platelets or bleeding - bruising, pinpoint red spots on the skin, black, tarry stools, blood in the urine -signs of decreased red blood cells - unusually weak or tired, fainting spells, lightheadedness -breathing problems -changes in vision -chest pain -mouth sores -nausea and vomiting -pain, swelling, redness at site where injected -pain, tingling, numbness in the hands or feet -redness, swelling, or sores on hands or feet -stomach pain -unusual bleeding Side effects that usually do not require medical attention (report to your doctor or health care professional if they continue or are bothersome): -changes in finger or toe nails -diarrhea -dry or itchy skin -hair loss -headache -loss of appetite -sensitivity of eyes to the light -stomach upset -unusually teary eyes This list may not describe all possible side effects. Call your doctor for medical advice about side effects. You may report side effects to FDA at 1-800-FDA-1088. Where should I keep my medicine? This drug is given in a hospital or clinic and will not be stored at home. NOTE: This sheet is a summary. It may not cover all possible information. If you have questions about this medicine, talk to your doctor, pharmacist, or health care provider.  2018  Elsevier/Gold Standard (2007-08-01 13:53:16)

## 2017-01-03 ENCOUNTER — Telehealth: Payer: Self-pay | Admitting: *Deleted

## 2017-01-03 NOTE — Telephone Encounter (Signed)
Spoke with patient. States he is doing fine, denies n/v, diarrhea. Good appetite, encouraged to increase  Hydration, call for any issues.

## 2017-01-03 NOTE — Telephone Encounter (Signed)
-----   Message from Ignacia Felling, RN sent at 01/02/2017 11:41 AM EDT ----- Regarding: chemo follow up call 1st ciplat/96hr 40fu pump.  Dr. Alen Blew     Patient phone [phone]

## 2017-01-06 ENCOUNTER — Ambulatory Visit (HOSPITAL_BASED_OUTPATIENT_CLINIC_OR_DEPARTMENT_OTHER): Payer: Self-pay

## 2017-01-06 VITALS — BP 141/87 | HR 107 | Temp 99.6°F | Resp 18

## 2017-01-06 DIAGNOSIS — Z452 Encounter for adjustment and management of vascular access device: Secondary | ICD-10-CM

## 2017-01-06 DIAGNOSIS — C609 Malignant neoplasm of penis, unspecified: Secondary | ICD-10-CM

## 2017-01-06 MED ORDER — SODIUM CHLORIDE 0.9% FLUSH
10.0000 mL | INTRAVENOUS | Status: DC | PRN
  Administered 2017-01-06: 10 mL
  Filled 2017-01-06: qty 10

## 2017-01-06 MED ORDER — HEPARIN SOD (PORK) LOCK FLUSH 100 UNIT/ML IV SOLN
500.0000 [IU] | Freq: Once | INTRAVENOUS | Status: AC | PRN
  Administered 2017-01-06: 500 [IU]
  Filled 2017-01-06: qty 5

## 2017-01-09 LAB — ACID FAST CULTURE WITH REFLEXED SENSITIVITIES: ACID FAST CULTURE - AFSCU3: NEGATIVE

## 2017-01-17 ENCOUNTER — Ambulatory Visit (HOSPITAL_BASED_OUTPATIENT_CLINIC_OR_DEPARTMENT_OTHER): Payer: Self-pay | Admitting: Oncology

## 2017-01-17 ENCOUNTER — Other Ambulatory Visit (HOSPITAL_BASED_OUTPATIENT_CLINIC_OR_DEPARTMENT_OTHER): Payer: Self-pay

## 2017-01-17 VITALS — BP 139/86 | HR 103 | Temp 99.1°F | Resp 18 | Ht 69.0 in | Wt 202.6 lb

## 2017-01-17 DIAGNOSIS — C609 Malignant neoplasm of penis, unspecified: Secondary | ICD-10-CM

## 2017-01-17 DIAGNOSIS — C778 Secondary and unspecified malignant neoplasm of lymph nodes of multiple regions: Secondary | ICD-10-CM

## 2017-01-17 DIAGNOSIS — D573 Sickle-cell trait: Secondary | ICD-10-CM

## 2017-01-17 DIAGNOSIS — Z7189 Other specified counseling: Secondary | ICD-10-CM

## 2017-01-17 LAB — CBC WITH DIFFERENTIAL/PLATELET
BASO%: 0.9 % (ref 0.0–2.0)
Basophils Absolute: 0.1 10*3/uL (ref 0.0–0.1)
EOS%: 1.8 % (ref 0.0–7.0)
Eosinophils Absolute: 0.1 10*3/uL (ref 0.0–0.5)
HEMATOCRIT: 28 % — AB (ref 38.4–49.9)
HGB: 9 g/dL — ABNORMAL LOW (ref 13.0–17.1)
LYMPH#: 1 10*3/uL (ref 0.9–3.3)
LYMPH%: 13.2 % — AB (ref 14.0–49.0)
MCH: 26.3 pg — ABNORMAL LOW (ref 27.2–33.4)
MCHC: 32 g/dL (ref 32.0–36.0)
MCV: 82 fL (ref 79.3–98.0)
MONO#: 0.5 10*3/uL (ref 0.1–0.9)
MONO%: 7 % (ref 0.0–14.0)
NEUT%: 77.1 % — ABNORMAL HIGH (ref 39.0–75.0)
NEUTROS ABS: 5.8 10*3/uL (ref 1.5–6.5)
PLATELETS: 361 10*3/uL (ref 140–400)
RBC: 3.41 10*6/uL — AB (ref 4.20–5.82)
RDW: 13.4 % (ref 11.0–14.6)
WBC: 7.5 10*3/uL (ref 4.0–10.3)

## 2017-01-17 LAB — COMPREHENSIVE METABOLIC PANEL
ALT: 7 U/L (ref 0–55)
ANION GAP: 8 meq/L (ref 3–11)
AST: 12 U/L (ref 5–34)
Albumin: 3 g/dL — ABNORMAL LOW (ref 3.5–5.0)
Alkaline Phosphatase: 75 U/L (ref 40–150)
BILIRUBIN TOTAL: 0.35 mg/dL (ref 0.20–1.20)
BUN: 15.5 mg/dL (ref 7.0–26.0)
CO2: 29 meq/L (ref 22–29)
CREATININE: 1.6 mg/dL — AB (ref 0.7–1.3)
Calcium: 9.4 mg/dL (ref 8.4–10.4)
Chloride: 105 mEq/L (ref 98–109)
EGFR: 59 mL/min/{1.73_m2} — ABNORMAL LOW (ref >90)
Glucose: 97 mg/dl (ref 70–140)
Potassium: 4.2 mEq/L (ref 3.5–5.1)
Sodium: 142 mEq/L (ref 136–145)
TOTAL PROTEIN: 6.8 g/dL (ref 6.4–8.3)

## 2017-01-17 NOTE — Progress Notes (Signed)
Hematology and Oncology Follow Up Visit  Ronald Wilson [id number] 1975-04-24 41 y.o. 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   Prior Therapy: He is status post a biopsy of his lymphadenopathy in August 2018.  Current therapy: He is currently receiving cisplatin and 5-FU palliative chemotherapy. Cycle one given on 01/02/2017.  Interim History: Ronald Wilson presents today for a follow-up visit. Since the last visit, he received his first cycle of chemotherapy without complications. He denied any nausea, vomiting or neuropathy. He remains active and attends to activities of daily living. He denied any increase in his pelvic adenopathy or discomfort. He does report his penile lesion have decreased in size. His urine output remained excellent.  He does not report any headaches, blurry vision, syncope or seizures. He does not report any fevers, chills or sweats. He does not report any cough, wheezing or hemoptysis. He does not report any nausea vomiting or abdominal pain. He does not report any constipation, diarrhea or over the satiety. He does not report any chest pain, palpitation, orthopnea. He does not report any hematochezia or melena. He does not report any hematuria or dysuria. Remaining review of systems unremarkable.   Medications: I have reviewed the patient's current medications.  Current Outpatient Prescriptions  Medication Sig Dispense Refill  . amLODipine (NORVASC) 10 MG tablet Take 1 tablet (10 mg total) by mouth daily. 30 tablet 5  . lidocaine-prilocaine (EMLA) cream Apply 1 application topically as needed. Apply to porta cath before chemotherapy. 30 g 0  . prochlorperazine (COMPAZINE) 10 MG tablet Take 1 tablet (10 mg total) by mouth every 6 (six) hours as needed for nausea or vomiting. 30 tablet 0    No current facility-administered medications for this visit.      Allergies: No Known Allergies  Past Medical History, Surgical history, Social history, and Family History were reviewed and updated.  [name] Physical Exam: Blood pressure 139/86, pulse (!) 103, temperature 99.1 F (37.3 C), temperature source Oral, resp. rate 18, height 5\' 9"  (1.753 m), weight 202 lb 9.6 oz (91.9 kg), SpO2 100 %. ECOG: 0 General appearance: alert and cooperative Head: Normocephalic, without obvious abnormality Neck: no adenopathy Lymph nodes: Cervical, supraclavicular, and axillary nodes normal. Heart:regular rate and rhythm, S1, S2 normal, no murmur, click, rub or gallop Lung:chest clear, no wheezing, rales, normal symmetric air entry Abdomin: soft, non-tender, without masses or organomegaly EXT:no erythema, induration, or nodules   Lab Results: Lab Results  Component Value Date   WBC 7.5 01/17/2017   HGB 9.0 (L) 01/17/2017   HCT 28.0 (L) 01/17/2017   MCV 82.0 01/17/2017   PLT 361 01/17/2017     Chemistry      Component Value Date/Time   NA 143 01/02/2017 0918   K 3.5 01/02/2017 0918   CL 106 11/28/2016 1044   CO2 27 01/02/2017 0918   BUN 9.4 01/02/2017 0918   CREATININE 1.4 (H) 01/02/2017 0918      Component Value Date/Time   CALCIUM 9.6 01/02/2017 0918   ALKPHOS 88 01/02/2017 0918   AST 13 01/02/2017 0918   ALT 11 01/02/2017 0918   BILITOT 0.47 01/02/2017 0918      Impression and Plan:  41 year old gentleman with the following issues:  1. 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 This was diagnosed in August 2018 after  presenting with left lower extremity edema. He has rather bulky disease no primary surgical therapy is indicated.  He is currently receiving salvage therapy with 5-FU and cisplatin and tolerated the first cycle well. The plan is to proceed with cycle 2 without any dose reduction or delay  scheduled for 01/30/2017. He will receive cycle 3 on 02/20/2017. We will repeat imaging studies after the third or fourth cycle of therapy.  2. IV access: Port-A-Cath inserted without complications.  3. Antiemetics: Prescription for Compazine will be made available to the patient. Very little nausea noted.  4. Renal function surveillance: His baseline kidney function is normal. We will continue to monitor that on cisplatin therapy.  5. Sickle cell trait: Baseline hemoglobin remains adequate at this time.  6. Follow-up: Will be 01/30/2017 for the second cycle of chemotherapy.  Ronald Button, MD 10/9/20181:19 PM

## 2017-01-30 ENCOUNTER — Other Ambulatory Visit (HOSPITAL_BASED_OUTPATIENT_CLINIC_OR_DEPARTMENT_OTHER): Payer: Self-pay

## 2017-01-30 ENCOUNTER — Ambulatory Visit (HOSPITAL_BASED_OUTPATIENT_CLINIC_OR_DEPARTMENT_OTHER): Payer: Self-pay

## 2017-01-30 VITALS — BP 144/79 | HR 93 | Temp 97.8°F

## 2017-01-30 DIAGNOSIS — Z7189 Other specified counseling: Secondary | ICD-10-CM

## 2017-01-30 DIAGNOSIS — C609 Malignant neoplasm of penis, unspecified: Secondary | ICD-10-CM

## 2017-01-30 DIAGNOSIS — Z5111 Encounter for antineoplastic chemotherapy: Secondary | ICD-10-CM

## 2017-01-30 LAB — CBC WITH DIFFERENTIAL/PLATELET
BASO%: 0.8 % (ref 0.0–2.0)
Basophils Absolute: 0.1 10*3/uL (ref 0.0–0.1)
EOS%: 1 % (ref 0.0–7.0)
Eosinophils Absolute: 0.1 10*3/uL (ref 0.0–0.5)
HEMATOCRIT: 29.3 % — AB (ref 38.4–49.9)
HEMOGLOBIN: 9.5 g/dL — AB (ref 13.0–17.1)
LYMPH#: 1.9 10*3/uL (ref 0.9–3.3)
LYMPH%: 16.2 % (ref 14.0–49.0)
MCH: 26.3 pg — ABNORMAL LOW (ref 27.2–33.4)
MCHC: 32.5 g/dL (ref 32.0–36.0)
MCV: 80.8 fL (ref 79.3–98.0)
MONO#: 1 10*3/uL — ABNORMAL HIGH (ref 0.1–0.9)
MONO%: 8.2 % (ref 0.0–14.0)
NEUT#: 8.7 10*3/uL — ABNORMAL HIGH (ref 1.5–6.5)
NEUT%: 73.8 % (ref 39.0–75.0)
Platelets: 317 10*3/uL (ref 140–400)
RBC: 3.62 10*6/uL — ABNORMAL LOW (ref 4.20–5.82)
RDW: 14.1 % (ref 11.0–14.6)
WBC: 11.8 10*3/uL — ABNORMAL HIGH (ref 4.0–10.3)

## 2017-01-30 LAB — COMPREHENSIVE METABOLIC PANEL
ALBUMIN: 3.3 g/dL — AB (ref 3.5–5.0)
ALT: 20 U/L (ref 0–55)
AST: 17 U/L (ref 5–34)
Alkaline Phosphatase: 77 U/L (ref 40–150)
Anion Gap: 11 mEq/L (ref 3–11)
BUN: 13.1 mg/dL (ref 7.0–26.0)
CALCIUM: 9.2 mg/dL (ref 8.4–10.4)
CHLORIDE: 101 meq/L (ref 98–109)
CO2: 28 mEq/L (ref 22–29)
Creatinine: 1.6 mg/dL — ABNORMAL HIGH (ref 0.7–1.3)
EGFR: >60 mL/min/{1.73_m2} (ref >60)
GLUCOSE: 113 mg/dL (ref 70–140)
Potassium: 3.6 mEq/L (ref 3.5–5.1)
SODIUM: 140 meq/L (ref 136–145)
Total Bilirubin: 0.47 mg/dL (ref 0.20–1.20)
Total Protein: 7.6 g/dL (ref 6.4–8.3)

## 2017-01-30 MED ORDER — CISPLATIN CHEMO INJECTION 100MG/100ML
80.0000 mg/m2 | Freq: Once | INTRAVENOUS | Status: AC
  Administered 2017-01-30: 168 mg via INTRAVENOUS
  Filled 2017-01-30: qty 168

## 2017-01-30 MED ORDER — CLONIDINE HCL 0.1 MG PO TABS
0.1000 mg | ORAL_TABLET | Freq: Once | ORAL | Status: AC
  Administered 2017-01-30: 0.1 mg via ORAL

## 2017-01-30 MED ORDER — SODIUM CHLORIDE 0.9 % IV SOLN
Freq: Once | INTRAVENOUS | Status: AC
  Administered 2017-01-30: 13:00:00 via INTRAVENOUS
  Filled 2017-01-30: qty 5

## 2017-01-30 MED ORDER — ACETAMINOPHEN 325 MG PO TABS
ORAL_TABLET | ORAL | Status: AC
  Filled 2017-01-30: qty 2

## 2017-01-30 MED ORDER — PALONOSETRON HCL INJECTION 0.25 MG/5ML
0.2500 mg | Freq: Once | INTRAVENOUS | Status: AC
  Administered 2017-01-30: 0.25 mg via INTRAVENOUS

## 2017-01-30 MED ORDER — POTASSIUM CHLORIDE 2 MEQ/ML IV SOLN
Freq: Once | INTRAVENOUS | Status: AC
  Administered 2017-01-30: 11:00:00 via INTRAVENOUS
  Filled 2017-01-30: qty 10

## 2017-01-30 MED ORDER — SODIUM CHLORIDE 0.9 % IV SOLN
1000.0000 mg/m2/d | INTRAVENOUS | Status: DC
  Administered 2017-01-30: 8400 mg via INTRAVENOUS
  Filled 2017-01-30: qty 168

## 2017-01-30 MED ORDER — ACETAMINOPHEN 325 MG PO TABS
650.0000 mg | ORAL_TABLET | Freq: Once | ORAL | Status: AC
  Administered 2017-01-30: 650 mg via ORAL

## 2017-01-30 MED ORDER — SODIUM CHLORIDE 0.9 % IV SOLN
Freq: Once | INTRAVENOUS | Status: AC
  Administered 2017-01-30: 11:00:00 via INTRAVENOUS

## 2017-01-30 MED ORDER — CLONIDINE HCL 0.1 MG PO TABS
ORAL_TABLET | ORAL | Status: AC
Start: 2017-01-30 — End: ?
  Filled 2017-01-30: qty 1

## 2017-01-30 MED ORDER — PALONOSETRON HCL INJECTION 0.25 MG/5ML
INTRAVENOUS | Status: AC
  Filled 2017-01-30: qty 5

## 2017-01-30 NOTE — Patient Instructions (Signed)
[facility] Discharge Instructions for Patients Receiving Chemotherapy  Today you received the following chemotherapy agents Cisplatin/5 Fu To help prevent nausea and vomiting after your treatment, we encourage you to take your nausea medication as prescribed.   If you develop nausea and vomiting that is not controlled by your nausea medication, call the clinic.   BELOW ARE SYMPTOMS THAT SHOULD BE REPORTED IMMEDIATELY:  *FEVER GREATER THAN 100.5 F  *CHILLS WITH OR WITHOUT FEVER  NAUSEA AND VOMITING THAT IS NOT CONTROLLED WITH YOUR NAUSEA MEDICATION  *UNUSUAL SHORTNESS OF BREATH  *UNUSUAL BRUISING OR BLEEDING  TENDERNESS IN MOUTH AND THROAT WITH OR WITHOUT PRESENCE OF ULCERS  *URINARY PROBLEMS  *BOWEL PROBLEMS  UNUSUAL RASH Items with * indicate a potential emergency and should be followed up as soon as possible.  Feel free to call the clinic should you have any questions or concerns. The clinic phone number is [phone].  Please show the [place] [facility] at check-in to the Emergency Department and triage nurse.

## 2017-01-30 NOTE — Progress Notes (Signed)
Patient complains of pain in his genitalia. Patient reports the pain is a 8 on the 0 to 10 pain scale. Patient states he takes tylenol at home and receives relief. Sandi Mealy, PA notified. Order given and carried out for Tylenol 650 mg PO. Patient verbalized understanding.   Ok to treat with creatinine 1.6 per Dr. Julien Nordmann. Patient blood pressure 165/99 Dr. Julien Nordmann aware order given and carried out for clonidine 0.1 mg PO. Patient verbalized understanding.

## 2017-02-03 ENCOUNTER — Ambulatory Visit (HOSPITAL_BASED_OUTPATIENT_CLINIC_OR_DEPARTMENT_OTHER): Payer: Self-pay

## 2017-02-03 VITALS — BP 155/99 | HR 100 | Temp 100.1°F | Resp 18

## 2017-02-03 DIAGNOSIS — C609 Malignant neoplasm of penis, unspecified: Secondary | ICD-10-CM

## 2017-02-03 DIAGNOSIS — Z452 Encounter for adjustment and management of vascular access device: Secondary | ICD-10-CM

## 2017-02-03 MED ORDER — HEPARIN SOD (PORK) LOCK FLUSH 100 UNIT/ML IV SOLN
500.0000 [IU] | Freq: Once | INTRAVENOUS | Status: AC | PRN
  Administered 2017-02-03: 500 [IU]
  Filled 2017-02-03: qty 5

## 2017-02-03 MED ORDER — SODIUM CHLORIDE 0.9% FLUSH
10.0000 mL | INTRAVENOUS | Status: DC | PRN
  Administered 2017-02-03: 10 mL
  Filled 2017-02-03: qty 10

## 2017-02-20 ENCOUNTER — Ambulatory Visit (HOSPITAL_BASED_OUTPATIENT_CLINIC_OR_DEPARTMENT_OTHER): Payer: Self-pay

## 2017-02-20 ENCOUNTER — Ambulatory Visit (HOSPITAL_BASED_OUTPATIENT_CLINIC_OR_DEPARTMENT_OTHER): Payer: Self-pay | Admitting: Oncology

## 2017-02-20 ENCOUNTER — Other Ambulatory Visit (HOSPITAL_BASED_OUTPATIENT_CLINIC_OR_DEPARTMENT_OTHER): Payer: Self-pay

## 2017-02-20 VITALS — BP 179/108 | HR 102 | Temp 98.7°F | Resp 18 | Ht 69.0 in | Wt 210.0 lb

## 2017-02-20 DIAGNOSIS — D6481 Anemia due to antineoplastic chemotherapy: Secondary | ICD-10-CM

## 2017-02-20 DIAGNOSIS — Z5111 Encounter for antineoplastic chemotherapy: Secondary | ICD-10-CM

## 2017-02-20 DIAGNOSIS — C609 Malignant neoplasm of penis, unspecified: Secondary | ICD-10-CM

## 2017-02-20 DIAGNOSIS — D573 Sickle-cell trait: Secondary | ICD-10-CM

## 2017-02-20 DIAGNOSIS — Z7189 Other specified counseling: Secondary | ICD-10-CM

## 2017-02-20 LAB — CBC WITH DIFFERENTIAL/PLATELET
BASO%: 0.7 % (ref 0.0–2.0)
BASOS ABS: 0.1 10*3/uL (ref 0.0–0.1)
EOS%: 3.2 % (ref 0.0–7.0)
Eosinophils Absolute: 0.2 10*3/uL (ref 0.0–0.5)
HEMATOCRIT: 23.3 % — AB (ref 38.4–49.9)
HEMOGLOBIN: 7.4 g/dL — AB (ref 13.0–17.1)
LYMPH%: 29.9 % (ref 14.0–49.0)
MCH: 26.1 pg — AB (ref 27.2–33.4)
MCHC: 31.8 g/dL — ABNORMAL LOW (ref 32.0–36.0)
MCV: 82 fL (ref 79.3–98.0)
MONO#: 0.7 10*3/uL (ref 0.1–0.9)
MONO%: 10.4 % (ref 0.0–14.0)
NEUT#: 3.8 10*3/uL (ref 1.5–6.5)
NEUT%: 55.8 % (ref 39.0–75.0)
Platelets: 390 10*3/uL (ref 140–400)
RBC: 2.84 10*6/uL — ABNORMAL LOW (ref 4.20–5.82)
RDW: 16.2 % — AB (ref 11.0–14.6)
WBC: 6.9 10*3/uL (ref 4.0–10.3)
lymph#: 2.1 10*3/uL (ref 0.9–3.3)

## 2017-02-20 LAB — COMPREHENSIVE METABOLIC PANEL
ALBUMIN: 3 g/dL — AB (ref 3.5–5.0)
ALK PHOS: 74 U/L (ref 40–150)
ALT: 10 U/L (ref 0–55)
AST: 13 U/L (ref 5–34)
Anion Gap: 8 mEq/L (ref 3–11)
BUN: 16 mg/dL (ref 7.0–26.0)
CO2: 26 mEq/L (ref 22–29)
CREATININE: 2 mg/dL — AB (ref 0.7–1.3)
Calcium: 9.2 mg/dL (ref 8.4–10.4)
Chloride: 105 mEq/L (ref 98–109)
EGFR: 46 mL/min/{1.73_m2} — ABNORMAL LOW (ref >60)
GLUCOSE: 81 mg/dL (ref 70–140)
POTASSIUM: 3.9 meq/L (ref 3.5–5.1)
SODIUM: 139 meq/L (ref 136–145)
TOTAL PROTEIN: 7.1 g/dL (ref 6.4–8.3)
Total Bilirubin: 0.24 mg/dL (ref 0.20–1.20)

## 2017-02-20 MED ORDER — PALONOSETRON HCL INJECTION 0.25 MG/5ML
0.2500 mg | Freq: Once | INTRAVENOUS | Status: AC
  Administered 2017-02-20: 0.25 mg via INTRAVENOUS

## 2017-02-20 MED ORDER — FLUOROURACIL CHEMO INJECTION 5 GM/100ML
1000.0000 mg/m2/d | INTRAVENOUS | Status: DC
  Administered 2017-02-20: 8400 mg via INTRAVENOUS
  Filled 2017-02-20: qty 168

## 2017-02-20 MED ORDER — MANNITOL 25 % IV SOLN
Freq: Once | INTRAVENOUS | Status: AC
  Administered 2017-02-20: 11:00:00 via INTRAVENOUS
  Filled 2017-02-20: qty 10

## 2017-02-20 MED ORDER — SODIUM CHLORIDE 0.9 % IV SOLN
Freq: Once | INTRAVENOUS | Status: AC
  Administered 2017-02-20: 13:00:00 via INTRAVENOUS
  Filled 2017-02-20: qty 5

## 2017-02-20 MED ORDER — SODIUM CHLORIDE 0.9 % IV SOLN
40.0000 mg/m2 | Freq: Once | INTRAVENOUS | Status: AC
  Administered 2017-02-20: 84 mg via INTRAVENOUS
  Filled 2017-02-20: qty 84

## 2017-02-20 MED ORDER — PALONOSETRON HCL INJECTION 0.25 MG/5ML
INTRAVENOUS | Status: AC
  Filled 2017-02-20: qty 5

## 2017-02-20 MED ORDER — SODIUM CHLORIDE 0.9 % IV SOLN
Freq: Once | INTRAVENOUS | Status: AC
  Administered 2017-02-20: 11:00:00 via INTRAVENOUS

## 2017-02-20 NOTE — Progress Notes (Signed)
Hematology and Oncology Follow Up Visit  Ronald Wilson [id number] 08-09-1975 41 y.o. 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   Prior Therapy: He is status post a biopsy of his lymphadenopathy in August 2018.  Current therapy: He is currently receiving cisplatin and 5-FU palliative chemotherapy. Cycle one given on 01/02/2017.  He is here for evaluation for cycle 3.  Interim History: [name] presents today for a follow-up visit. Since the last visit, he reports no major changes in his health.  He continues to have inguinal adenopathy which has not changed dramatically in lower extremity edema.  He continues to receive chemotherapy without complications.  He denied any nausea, vomiting or neuropathy. He remains active and attends to activities of daily living. He denied any increase in his pelvic adenopathy or discomfort. He does report his penile lesion have decreased in size.  Does not report any urination difficulties including hematuria or dysuria.  He denied any hearing deficits of peripheral neuropathy.  He does not report any headaches, blurry vision, syncope or seizures. He does not report any fevers, chills or sweats. He does not report any cough, wheezing or hemoptysis. He does not report any nausea vomiting or abdominal pain. He does not report any constipation, diarrhea or over the satiety. He does not report any chest pain, palpitation, orthopnea. He does not report any hematochezia or melena. He does not report any hematuria or dysuria. Remaining review of systems unremarkable.   Medications: I have reviewed the patient's current medications.  Current Outpatient Medications  Medication Sig Dispense Refill  . amLODipine (NORVASC) 10 MG tablet Take 1 tablet (10 mg total) by mouth daily. 30 tablet 5   . lidocaine-prilocaine (EMLA) cream Apply 1 application topically as needed. Apply to porta cath before chemotherapy. 30 g 0  . prochlorperazine (COMPAZINE) 10 MG tablet Take 1 tablet (10 mg total) by mouth every 6 (six) hours as needed for nausea or vomiting. 30 tablet 0   No current facility-administered medications for this visit.      Allergies: No Known Allergies  Past Medical History, Surgical history, Social history, and Family History were reviewed and updated.  [name] Physical Exam: Blood pressure (!) 179/108, pulse (!) 102, temperature 98.7 F (37.1 C), temperature source Oral, resp. rate 18, height 5\' 9"  (1.753 m), weight 210 lb (95.3 kg), SpO2 100 %. ECOG: 0 General appearance: alert and cooperative appeared without distress. Head: Normocephalic, without obvious abnormality oral thrush or ulcers. Neck: no adenopathy Lymph nodes: Inguinal adenopathy palpated more left than right. Heart:regular rate and rhythm, S1, S2 normal, no murmur, click, rub or gallop Lung:chest clear, no wheezing, rales, normal symmetric air entry Abdomin: soft, non-tender, without masses or organomegaly EXT: Left leg edema noted unchanged.   Lab Results: Lab Results  Component Value Date   WBC 6.9 02/20/2017   HGB 7.4 (L) 02/20/2017   HCT 23.3 (L) 02/20/2017   MCV 82.0 02/20/2017   PLT 390 02/20/2017     Chemistry      Component Value Date/Time   NA 140 01/30/2017 0933   K 3.6 01/30/2017 0933   CL 106 11/28/2016 1044   CO2 28 01/30/2017 0933   BUN 13.1 01/30/2017 0933   CREATININE 1.6 (H) 01/30/2017 0933      Component Value Date/Time   CALCIUM 9.2 01/30/2017 0933   ALKPHOS 77 01/30/2017 0933  AST 17 01/30/2017 0933   ALT 20 01/30/2017 0933   BILITOT 0.47 01/30/2017 0933      Impression and Plan:  41 year old gentleman with the following issues:  1. 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 This was  diagnosed in August 2018 after presenting with left lower extremity edema. He has rather bulky disease no primary surgical therapy is indicated.  He is currently receiving salvage therapy with 5-FU and cisplatin and tolerated it well without complications.   The plan is to proceed with cycle 3 with decreasing the dose of cisplatin due to increase in his creatinine.  Plan is to repeat imaging studies after the third cycle to assess response to therapy.  If his scan shows positive response, the plan is to continue for total 6 cycles.  2. IV access: Port-A-Cath used without complications.  3. Antiemetics: Prescription for Compazine will be made available to the patient. Very little nausea noted.  4. Renal function surveillance: His creatinine has increased slightly.  We will reduce cisplatin dose moving forward by 50%.  5.  Anemia: Related to chemotherapy.  He is asymptomatic at this time but will consider packed red cell transfusion in the future.  5. Sickle cell trait: Baseline hemoglobin remains adequate at this time.  6. Follow-up: Will be in 3 weeks for the next cycle of chemotherapy.  Zola Button, MD 11/12/20189:46 AM

## 2017-02-21 ENCOUNTER — Telehealth: Payer: Self-pay | Admitting: Oncology

## 2017-02-21 NOTE — Telephone Encounter (Signed)
Scheduled apt per 11/12 los -patient is aware and will pick up an updated schedule.

## 2017-02-24 ENCOUNTER — Ambulatory Visit (HOSPITAL_BASED_OUTPATIENT_CLINIC_OR_DEPARTMENT_OTHER): Payer: Self-pay

## 2017-02-24 VITALS — BP 154/98 | HR 97 | Temp 98.6°F

## 2017-02-24 DIAGNOSIS — C609 Malignant neoplasm of penis, unspecified: Secondary | ICD-10-CM

## 2017-02-24 DIAGNOSIS — Z452 Encounter for adjustment and management of vascular access device: Secondary | ICD-10-CM

## 2017-02-24 MED ORDER — HEPARIN SOD (PORK) LOCK FLUSH 100 UNIT/ML IV SOLN
500.0000 [IU] | Freq: Once | INTRAVENOUS | Status: AC | PRN
Start: 2017-02-24 — End: 2017-02-24
  Administered 2017-02-24: 500 [IU]
  Filled 2017-02-24: qty 5

## 2017-02-24 MED ORDER — SODIUM CHLORIDE 0.9% FLUSH
10.0000 mL | INTRAVENOUS | Status: DC | PRN
  Administered 2017-02-24: 10 mL
  Filled 2017-02-24: qty 10

## 2017-02-24 NOTE — Patient Instructions (Signed)
Implanted Port Home Guide An implanted port is a type of central line that is placed under the skin. Central lines are used to provide IV access when treatment or nutrition needs to be given through a person's veins. Implanted ports are used for long-term IV access. An implanted port may be placed because:  You need IV medicine that would be irritating to the small veins in your hands or arms.  You need long-term IV medicines, such as antibiotics.  You need IV nutrition for a long period.  You need frequent blood draws for lab tests.  You need dialysis.  Implanted ports are usually placed in the chest area, but they can also be placed in the upper arm, the abdomen, or the leg. An implanted port has two main parts:  Reservoir. The reservoir is round and will appear as a small, raised area under your skin. The reservoir is the part where a needle is inserted to give medicines or draw blood.  Catheter. The catheter is a thin, flexible tube that extends from the reservoir. The catheter is placed into a large vein. Medicine that is inserted into the reservoir goes into the catheter and then into the vein.  How will I care for my incision site? Do not get the incision site wet. Bathe or shower as directed by your health care provider. How is my port accessed? Special steps must be taken to access the port:  Before the port is accessed, a numbing cream can be placed on the skin. This helps numb the skin over the port site.  Your health care provider uses a sterile technique to access the port. ? Your health care provider must put on a mask and sterile gloves. ? The skin over your port is cleaned carefully with an antiseptic and allowed to dry. ? The port is gently pinched between sterile gloves, and a needle is inserted into the port.  Only "non-coring" port needles should be used to access the port. Once the port is accessed, a blood return should be checked. This helps ensure that the port  is in the vein and is not clogged.  If your port needs to remain accessed for a constant infusion, a clear (transparent) bandage will be placed over the needle site. The bandage and needle will need to be changed every week, or as directed by your health care provider.  Keep the bandage covering the needle clean and dry. Do not get it wet. Follow your health care provider's instructions on how to take a shower or bath while the port is accessed.  If your port does not need to stay accessed, no bandage is needed over the port.  What is flushing? Flushing helps keep the port from getting clogged. Follow your health care provider's instructions on how and when to flush the port. Ports are usually flushed with saline solution or a medicine called heparin. The need for flushing will depend on how the port is used.  If the port is used for intermittent medicines or blood draws, the port will need to be flushed: ? After medicines have been given. ? After blood has been drawn. ? As part of routine maintenance.  If a constant infusion is running, the port may not need to be flushed.  How long will my port stay implanted? The port can stay in for as long as your health care provider thinks it is needed. When it is time for the port to come out, surgery will be   done to remove it. The procedure is similar to the one performed when the port was put in. When should I seek immediate medical care? When you have an implanted port, you should seek immediate medical care if:  You notice a bad smell coming from the incision site.  You have swelling, redness, or drainage at the incision site.  You have more swelling or pain at the port site or the surrounding area.  You have a fever that is not controlled with medicine.  This information is not intended to replace advice given to you by your health care provider. Make sure you discuss any questions you have with your health care provider. Document  Released: 03/28/2005 Document Revised: 09/03/2015 Document Reviewed: 12/03/2012 Elsevier Interactive Patient Education  2017 [employer]  

## 2017-03-01 ENCOUNTER — Ambulatory Visit: Payer: Self-pay | Admitting: Family Medicine

## 2017-03-06 ENCOUNTER — Ambulatory Visit (HOSPITAL_COMMUNITY): Payer: Self-pay

## 2017-03-10 ENCOUNTER — Ambulatory Visit (HOSPITAL_COMMUNITY)
Admission: RE | Admit: 2017-03-10 | Discharge: 2017-03-10 | Disposition: A | Discharge status: Home / Self Care | Payer: Medicaid Other | Source: Ambulatory Visit | Attending: Oncology | Admitting: Oncology

## 2017-03-10 DIAGNOSIS — C609 Malignant neoplasm of penis, unspecified: Secondary | ICD-10-CM | POA: Diagnosis present

## 2017-03-10 DIAGNOSIS — I700 Atherosclerosis of aorta: Secondary | ICD-10-CM | POA: Diagnosis not present

## 2017-03-10 DIAGNOSIS — R609 Edema, unspecified: Secondary | ICD-10-CM | POA: Insufficient documentation

## 2017-03-10 DIAGNOSIS — R590 Localized enlarged lymph nodes: Secondary | ICD-10-CM | POA: Diagnosis not present

## 2017-03-13 ENCOUNTER — Other Ambulatory Visit: Payer: Self-pay | Admitting: Oncology

## 2017-03-13 ENCOUNTER — Ambulatory Visit (HOSPITAL_BASED_OUTPATIENT_CLINIC_OR_DEPARTMENT_OTHER): Payer: Medicaid Other | Admitting: Oncology

## 2017-03-13 ENCOUNTER — Other Ambulatory Visit (HOSPITAL_BASED_OUTPATIENT_CLINIC_OR_DEPARTMENT_OTHER): Payer: Self-pay

## 2017-03-13 ENCOUNTER — Ambulatory Visit: Payer: Self-pay

## 2017-03-13 ENCOUNTER — Telehealth: Payer: Self-pay | Admitting: Oncology

## 2017-03-13 VITALS — BP 187/109 | HR 83 | Temp 98.3°F | Resp 18 | Ht 69.0 in | Wt 209.5 lb

## 2017-03-13 DIAGNOSIS — C609 Malignant neoplasm of penis, unspecified: Secondary | ICD-10-CM

## 2017-03-13 DIAGNOSIS — D649 Anemia, unspecified: Secondary | ICD-10-CM

## 2017-03-13 DIAGNOSIS — Z7189 Other specified counseling: Secondary | ICD-10-CM

## 2017-03-13 DIAGNOSIS — D6481 Anemia due to antineoplastic chemotherapy: Secondary | ICD-10-CM

## 2017-03-13 LAB — COMPREHENSIVE METABOLIC PANEL
ALT: 10 U/L (ref 0–55)
ANION GAP: 8 meq/L (ref 3–11)
AST: 15 U/L (ref 5–34)
Albumin: 3.2 g/dL — ABNORMAL LOW (ref 3.5–5.0)
Alkaline Phosphatase: 78 U/L (ref 40–150)
BILIRUBIN TOTAL: 0.22 mg/dL (ref 0.20–1.20)
BUN: 15.6 mg/dL (ref 7.0–26.0)
CHLORIDE: 104 meq/L (ref 98–109)
CO2: 28 meq/L (ref 22–29)
CREATININE: 2 mg/dL — AB (ref 0.7–1.3)
Calcium: 10 mg/dL (ref 8.4–10.4)
EGFR: 48 mL/min/{1.73_m2} — ABNORMAL LOW (ref >60)
GLUCOSE: 95 mg/dL (ref 70–140)
Potassium: 3.6 mEq/L (ref 3.5–5.1)
Sodium: 140 mEq/L (ref 136–145)
TOTAL PROTEIN: 6.9 g/dL (ref 6.4–8.3)

## 2017-03-13 LAB — CBC WITH DIFFERENTIAL/PLATELET
HCT: 24.6 % — ABNORMAL LOW (ref 38.4–49.9)
HGB: 8.2 g/dL — ABNORMAL LOW (ref 13.0–17.1)
MCH: 28.2 pg (ref 27.2–33.4)
MCHC: 33.2 g/dL (ref 32.0–36.0)
MCV: 84.9 fL (ref 79.3–98.0)
Platelets: 299 10*3/uL (ref 140–400)
RBC: 2.89 10*6/uL — ABNORMAL LOW (ref 4.20–5.82)
RDW: 19.2 % — AB (ref 11.0–14.6)
WBC: 9.1 10*3/uL (ref 4.0–10.3)

## 2017-03-13 LAB — MANUAL DIFFERENTIAL
ALC: 2 10*3/uL (ref 0.9–3.3)
ANC (CHCC MAN DIFF): 5.5 10*3/uL (ref 1.5–6.5)
BAND NEUTROPHILS: 4 % (ref 0–10)
Blasts: 1 % — ABNORMAL HIGH (ref 0–0)
EOS: 3 % (ref 0–7)
LYMPH: 22 % (ref 14–49)
MONO: 14 % (ref 0–14)
PLT EST: ADEQUATE
SEG: 56 % (ref 38–77)
nRBC: 1 % — ABNORMAL HIGH (ref 0–0)

## 2017-03-13 NOTE — Telephone Encounter (Signed)
Scheduled appt per 12/2 los - Gave patient Avs and calender per los.

## 2017-03-13 NOTE — Progress Notes (Signed)
DISCONTINUE OFF PATHWAY REGIMEN - [Other Dx]   [id number]:Cisplatin + 5-Fluorouracil (80/1000):   A cycle is every 28 days:     Cisplatin      5-Fluorouracil   **Always confirm dose/schedule in your pharmacy ordering system**    REASON: Disease Progression PRIOR TREATMENT: Cisplatin + 5-Fluorouracil (80/1000) TREATMENT RESPONSE: Progressive Disease (PD)  START OFF PATHWAY REGIMEN - [Other Dx]   [id number]:Carboplatin + Paclitaxel (5/175) q21 Days:   A cycle is every 21 days:     Paclitaxel      Carboplatin   **Always confirm dose/schedule in your pharmacy ordering system**    Patient Characteristics: Intent of Therapy: Non-Curative / Palliative Intent, Discussed with Patient

## 2017-03-13 NOTE — Progress Notes (Signed)
Hematology and Oncology Follow Up Visit  Ronald Wilson [id number] October 08, 1975 41 y.o. 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   Prior Therapy: He is status post a biopsy of his lymphadenopathy in August 2018.  Current therapy: He is currently receiving cisplatin and 5-FU palliative chemotherapy. Cycle one given on 01/02/2017.  He is here for evaluation for cycle 4.  Interim History: [name] presents today for a follow-up visit. Since the last visit, he reports no recent complaints.  He continues to have inguinal adenopathy which continues to progress despite being on chemotherapy.  He continues to receive chemotherapy without complications.  He denied any nausea, vomiting or neuropathy. He remains active and attends to activities of daily living.  He continues to have increased edema related to his pelvic adenopathy.  Still able to drive and has reasonable quality of life.  He does not report any headaches, blurry vision, syncope or seizures. He does not report any fevers, chills or sweats. He does not report any cough, wheezing or hemoptysis. He does not report any nausea vomiting or abdominal pain. He does not report any constipation, diarrhea or over the satiety. He does not report any chest pain, palpitation, orthopnea. He does not report any hematochezia or melena. He does not report any hematuria or dysuria. Remaining review of systems unremarkable.   Medications: I have reviewed the patient's current medications.  Current Outpatient Medications  Medication Sig Dispense Refill  . amLODipine (NORVASC) 10 MG tablet Take 1 tablet (10 mg total) by mouth daily. 30 tablet 5  . lidocaine-prilocaine (EMLA) cream Apply 1 application topically as needed. Apply to porta cath before chemotherapy. 30 g 0  .  prochlorperazine (COMPAZINE) 10 MG tablet Take 1 tablet (10 mg total) by mouth every 6 (six) hours as needed for nausea or vomiting. 30 tablet 0   No current facility-administered medications for this visit.      Allergies: No Known Allergies  Past Medical History, Surgical history, Social history, and Family History were reviewed and updated.  [name] Physical Exam: Blood pressure (!) 187/109, pulse 83, temperature 98.3 F (36.8 C), temperature source Oral, resp. rate 18, height 5\' 9"  (1.753 m), weight 209 lb 8 oz (95 kg), SpO2 100 %. ECOG: 0 General appearance: Alert, awake gentleman appeared without distress today. Head: Normocephalic, without obvious abnormality oral ulcers or lesions. Neck: no adenopathy or masses. Lymph nodes: Inguinal adenopathy palpated more left than right. Heart:regular rate and rhythm, S1, S2 normal, no murmur, click, rub or gallop Lung:chest clear, no wheezing, rales, normal symmetric air entry Abdomin: soft, non-tender, without masses or organomegaly EXT: Left leg edema noted unchanged.   Lab Results: Lab Results  Component Value Date   WBC 9.1 03/13/2017   HGB 8.2 (L) 03/13/2017   HCT 24.6 (L) 03/13/2017   MCV 84.9 03/13/2017   PLT 299 03/13/2017     Chemistry      Component Value Date/Time   NA 140 03/13/2017 0827   K 3.6 03/13/2017 0827   CL 106 11/28/2016 1044   CO2 28 03/13/2017 0827   BUN 15.6 03/13/2017 0827   CREATININE 2.0 (H) 03/13/2017 0827      Component Value Date/Time   CALCIUM 10.0 03/13/2017 0827   ALKPHOS 78 03/13/2017 0827   AST 15 03/13/2017 0827   ALT 10 03/13/2017 0827   BILITOT 0.22 03/13/2017 0827  EXAM: CT ABDOMEN AND PELVIS WITHOUT CONTRAST  TECHNIQUE: Multidetector CT imaging of the abdomen and pelvis was performed following the standard protocol without IV contrast.  COMPARISON:  11/17/2016  FINDINGS: Lower chest: Unremarkable  Hepatobiliary: Unremarkable  Pancreas: Unremarkable  Spleen:  Unremarkable  Adrenals/Urinary Tract: Small calcification adjacent to a known parapelvic cyst in the left mid to lower kidney medially on image 36/2, probably simply a Bosniak category 2 cyst. No current hydronephrosis.  Stomach/Bowel: Scattered descending colon diverticula. There are few small diverticula elsewhere in the colon. Appendix normal. No dilated bowel.  Vascular/Lymphatic: Aortoiliac atherosclerotic vascular disease.  Pathologic right retrocrural, periaortic, retrocaval, and left greater than right iliac and inguinal adenopathy. Conglomerate left periaortic adenopathy measures 3.2 cm in short axis on image 42/2, previously 2.2 cm by my measurement. Left external iliac node with faint irregular internal calcifications measures 4.5 cm in short axis on image 68/2, previously 4.1 cm. An individual cystic lymph node in the left groin (1 of numerous left inguinal lymph nodes) has a short axis diameter of 6.2 cm, formerly 4.1 cm.  There is extensive edema in the left upper thigh tracking into the pubis and lateral to the left hip. This is increased compared to the prior exam and possibilities might include impaired lymphatic drainage due to the massive adenopathy, or extrinsic venous compression involving the external iliac vein. I favor the former.  Reproductive: Greater than expected fluid density tracking along the penile urethra, with somewhat irregular serpentine contour along the shaft of the penis.  Prostate gland calcifications noted.  Other: No supplemental non-categorized findings.  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  IMPRESSION: 1. Worsening retroperitoneal and left pelvic adenopathy with massive left external iliac and inguinal adenopathy. Prominent edema in the left thigh region extending up into the pubis and lateral to the left hip  likely due to lymphatic obstruction, less likely to be due to extrinsic venous compression by the adenopathy causing poor venous drainage. 2. Somewhat distended penile urethra with internal fluid density. 3.  Aortic Atherosclerosis (ICD10-I70.0).   Impression and Plan:  41 year old gentleman with the following issues:  1. 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 This was diagnosed in August 2018 after presenting with left lower extremity edema. He has rather bulky disease no primary surgical therapy is indicated.  He is currently receiving salvage therapy with 5-FU and cisplatin and tolerated it well without complications.   CT scan obtained on 03/10/2017 was reviewed today and showed progression of disease.  The CT scan results were discussed today with the patient and certainly this carries a rather poor prognosis moving forward.  I reiterated the fact that any treatment is palliative at this time and the likelihood that were able to palliate this cancer is becoming less and less likely.  The plan is to discontinue this current chemotherapy and switch to taxanes based regimen with carboplatin.  He will receive chemotherapy utilizing carboplatin and paclitaxel on every three-week basis.  Complications were reiterated today including nausea, fatigue and infusion related complications.  I anticipate starting this chemotherapy in the next couple weeks.  In the meantime, I will for him to radiation oncology for evaluation for possible palliative radiation to his pelvic adenopathy.  2. IV access: Port-A-Cath used without complications.  3. Antiemetics: Prescription for Compazine will be made available to the patient.  No issues noted.  4.  Renal function surveillance: His creatinine remains stable at this time.  5.  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  His hemoglobin remained stable without  transfusion.  We will continue to monitor at this time.  6.  Lower extremity edema and pelvic adenopathy: Related to his malignancy.  I will refer him to radiation oncology for possible palliative radiation to the left side of his pelvic adenopathy.  7.  Prognosis: Poor with limited life expectancy because of the aggressive nature of his cancer and the fact that has been poorly responsive to chemotherapy.  Even his age and his reasonable performance status, aggressive therapy continue to be warranted at this time.  8. Follow-up: Will be in 3 weeks for the next cycle of chemotherapy.  Zola Button, MD 12/3/20189:31 AM

## 2017-03-22 NOTE — Progress Notes (Addendum)
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     Ronald Wilson presented to the ED on 11/17/2016 with symptoms of progressive lower extremity edema predominantly on the left.    Past/Anticipated interventions by urology, if any: referral to Dr. Alen Blew  Past/Anticipated interventions by medical oncology, if any: Cisplatin and 5FU received but the current plan is to switch to taxanes with carboplatin. Per Dr. Alen Blew he will receive chemotherapy utilizing carboplatin and paclitaxel every three weeks. Reports he hasn't had chemotherapy in a month.   Weight changes, if any: Reports he has gained ten pounds since August 2018  Bowel/Bladder complaints, if any: Denies diarrhea or constipation. Reports nocturia x 2-3. Denies dysuria or hematuria. Reports intermittent urine stream. Reports occasionally he has to strain to void.    Nausea/Vomiting, if any: no  Pain issues, if any:  Intermittent penile pain managed with ibuprofen  SAFETY ISSUES:  Prior radiation? no  Pacemaker/ICD? no  Possible current pregnancy? no  Is the patient on methotrexate? no  Current Complaints / other details:  41 year old male. Single. Resides in [place]. 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 BP pressure is very high today. Patient reports needing a new prescription for his blood pressure medication. Patient denies headache or chest pain.

## 2017-03-23 ENCOUNTER — Ambulatory Visit
Admission: RE | Admit: 2017-03-23 | Discharge: 2017-03-23 | Disposition: A | Discharge status: Home / Self Care | Payer: Medicaid Other | Source: Ambulatory Visit | Attending: Radiation Oncology | Admitting: Radiation Oncology

## 2017-03-23 ENCOUNTER — Encounter: Payer: Self-pay | Admitting: Radiation Oncology

## 2017-03-23 ENCOUNTER — Other Ambulatory Visit: Payer: Self-pay

## 2017-03-23 VITALS — BP 191/111 | HR 104 | Resp 16 | Ht 68.0 in | Wt 205.4 lb

## 2017-03-23 DIAGNOSIS — C609 Malignant neoplasm of penis, unspecified: Secondary | ICD-10-CM

## 2017-03-23 DIAGNOSIS — Z8249 Family history of ischemic heart disease and other diseases of the circulatory system: Secondary | ICD-10-CM | POA: Diagnosis not present

## 2017-03-23 DIAGNOSIS — F17210 Nicotine dependence, cigarettes, uncomplicated: Secondary | ICD-10-CM | POA: Insufficient documentation

## 2017-03-23 DIAGNOSIS — Z79899 Other long term (current) drug therapy: Secondary | ICD-10-CM | POA: Insufficient documentation

## 2017-03-23 DIAGNOSIS — I10 Essential (primary) hypertension: Secondary | ICD-10-CM | POA: Insufficient documentation

## 2017-03-23 DIAGNOSIS — R590 Localized enlarged lymph nodes: Secondary | ICD-10-CM

## 2017-03-23 DIAGNOSIS — D074 Carcinoma in situ of penis: Secondary | ICD-10-CM

## 2017-03-23 DIAGNOSIS — D573 Sickle-cell trait: Secondary | ICD-10-CM | POA: Insufficient documentation

## 2017-03-23 DIAGNOSIS — Z510 Encounter for antineoplastic radiation therapy: Secondary | ICD-10-CM | POA: Diagnosis present

## 2017-03-23 DIAGNOSIS — C778 Secondary and unspecified malignant neoplasm of lymph nodes of multiple regions: Secondary | ICD-10-CM | POA: Diagnosis not present

## 2017-03-23 DIAGNOSIS — I890 Lymphedema, not elsewhere classified: Secondary | ICD-10-CM | POA: Diagnosis not present

## 2017-03-23 NOTE — Progress Notes (Signed)
See progress note under physician encounter. 

## 2017-03-23 NOTE — Progress Notes (Signed)
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    ICD-10-CM   1. 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 Penis cancer ([place]) C60.9   3. Inguinal lymphadenopathy R59.0     HISTORY OF PRESENT ILLNESS: Ronald Wilson is a 41 y.o. male seen at the request of Dr.Shadad. The patient presented to the ED on 11/17/2016 with symptoms of progressive lower extremity edema predominantly to the left. 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  He was started on palliative chemotherapy with cisplatin and 5-FU on 01/02/2017- has completed 3 cycles.      Recent CT of the Abdomen and pelvis on 11/30/201 revealed disease progression with worsening retroperitoneal and left pelvic adenopathy with massive left external iliac and inguinal adenopathy despite current treatment. There was prominent edema in the left thigh region extending up into the pubis and lateral to the left hip likely due to lymphatic obstruction, less likely to be due to extrinsic venous compression by the adenopathy causing poor venous drainage.   These findings are the likely culprit for the 3+ edema in the left lower extremity which has been progressively worsening over the past several weeks.  The patient met with Dr.Shadad on 03/13/17 and the plan is to switch the patient's systemic therapy to carboplatin and paclitaxel but his first dose has not yet been scheduled.  He has been kindly referred to discuss the potential role for palliative radiotherapy to the progressive lymphadenopathy in  the pelvis to help alleviate and prevent progressive lymphedema in the LLE.  PREVIOUS RADIATION THERAPY: No  PAST MEDICAL HISTORY:  Past Medical History:  Diagnosis Date  . Hypertension   . Inguinal mass 11/17/2016   left  . Sickle cell trait ([place])       PAST SURGICAL HISTORY: Past Surgical History:  Procedure Laterality Date  . IR FLUORO GUIDE PORT INSERTION RIGHT  12/26/2016  . IR US GUIDE VASC ACCESS RIGHT  12/26/2016  . NO PAST SURGERIES      FAMILY HISTORY:  Family History  Problem Relation Age of Onset  . Hypertension Mother   . Cancer Maternal Aunt        unknown    SOCIAL HISTORY: Patient has 7 children between the ages of 40 and 17 yo. Social History   Socioeconomic History  . Marital status: Single    Spouse name: Not on file  . Number of children: Not on file  . Years of education: Not on file  . Highest education level: Not on file  Social Needs  . Financial resource strain: Not on file  . Food insecurity - worry: Not on file  . Food insecurity - inability: Not on file  . Transportation needs - medical: Not on file  . Transportation needs - non-medical: Not on file  Occupational History  . Not on file  Tobacco Use  . Smoking status: Current Every Day Smoker    Packs/day: 0.25    Years: 25.00    Pack years: 6.25    Types: Cigarettes  . Smokeless tobacco: Never Used  Substance and  Sexual Activity  . Alcohol use: Yes    Alcohol/week: 9.0 oz    Types: 15 Shots of liquor per week    Comment: "11/17/2016 "I'll have shots ~ 3 days/wk"  . Drug use: Yes    Types: Marijuana    Comment: 11/17/2016 "daily"  . Sexual activity: Yes    Birth control/protection: Condom  Other Topics Concern  . Not on file  Social History Narrative   Resides in [place]. 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 Reports he and his father live together.     ALLERGIES: Patient has no known allergies.  MEDICATIONS:  Current Outpatient Medications  Medication Sig Dispense Refill  .  amLODipine (NORVASC) 10 MG tablet Take 1 tablet (10 mg total) by mouth daily. (Patient not taking: Reported on 03/23/2017) 30 tablet 5  . lidocaine-prilocaine (EMLA) cream Apply 1 application topically as needed. Apply to porta cath before chemotherapy. (Patient not taking: Reported on 03/23/2017) 30 g 0  . prochlorperazine (COMPAZINE) 10 MG tablet Take 1 tablet (10 mg total) by mouth every 6 (six) hours as needed for nausea or vomiting. (Patient not taking: Reported on 03/23/2017) 30 tablet 0   No current facility-administered medications for this encounter.     REVIEW OF SYSTEMS:  On review of systems, the patient reports that he is doing well overall. He denies any chest pain, shortness of breath, cough, fevers, chills, night sweats, unintended weight changes. He has noticed progressive swelling in the LLE but denies swelling in the right lower extremity.  He does not have associated pain, numbness or tingling in the LLE.  He denies calf pain/tenderness. He denies any bowel or bladder disturbances, and denies abdominal pain, nausea or vomiting. He denies any new musculoskeletal or joint aches or pains. A complete review of systems is obtained and is otherwise negative.  PHYSICAL EXAM:  Wt Readings from Last 3 Encounters:  03/23/17 205 lb 6.4 oz (93.2 kg)  03/13/17 209 lb 8 oz (95 kg)  02/20/17 210 lb (95.3 kg)   Temp Readings from Last 3 Encounters:  03/13/17 98.3 F (36.8 C) (Oral)  02/24/17 98.6 F (37 C) (Oral)  02/20/17 98.7 F (37.1 C) (Oral)   BP Readings from Last 3 Encounters:  03/23/17 (!) 191/111  03/13/17 (!) 187/109  02/24/17 (!) 154/98   Pulse Readings from Last 3 Encounters:  03/23/17 (!) 104  03/13/17 83  02/24/17 97   Pain Assessment Pain Score: 0-No pain/10  In general this is a well appearing African American male in no acute distress. He is alert and oriented x4 and appropriate throughout the examination. HEENT reveals that the patient is normocephalic,  atraumatic. EOMs are intact. PERRLA. Skin is intact without any evidence of gross lesions. Cardiovascular exam reveals a regular rate and rhythm, no clicks rubs or murmurs are auscultated. Chest is clear to auscultation bilaterally. Lymphatic assessment is performed and does not reveal any adenopathy in the cervical or supraclavicular chains. There is diffuse inguinal adenopathy palpated on the left greater than right.  Abdomen has active bowel sounds in all quadrants and is intact. The abdomen is soft, non tender, non distended. Lower extremities are negative for deep calf tenderness, cyanosis or clubbing.  There is 3+ lymphedema in the LLE, no edema in the RLE.  Sensation is intact to light touch in the bilateral LEs and strength is 5/5 and equal BLEs.  KPS = 80  100 - Normal; no complaints; no evidence of disease. 90   - Able to carry  on normal activity; minor signs or symptoms of disease. 80   - Normal activity with effort; some signs or symptoms of disease. 71   - Cares for self; unable to carry on normal activity or to do active work. 60   - Requires occasional assistance, but is able to care for most of his personal needs. 50   - Requires considerable assistance and frequent medical care. 29   - Disabled; requires special care and assistance. 68   - Severely disabled; hospital admission is indicated although death not imminent. 44   - Very sick; hospital admission necessary; active supportive treatment necessary. 10   - Moribund; fatal processes progressing rapidly. 0     - Dead  Karnofsky DA, [facility], Craver LS and Burchenal [facility] [phone]) The use of the nitrogen mustards in the palliative treatment of carcinoma: with particular reference to bronchogenic carcinoma Cancer 1 634-56  LABORATORY DATA:  Lab Results  Component Value Date   WBC 9.1 03/13/2017   HGB 8.2 (L) 03/13/2017   HCT 24.6 (L) 03/13/2017   MCV 84.9 03/13/2017   PLT 299 03/13/2017   Lab Results  Component Value Date     NA 140 03/13/2017   K 3.6 03/13/2017   CL 106 11/28/2016   CO2 28 03/13/2017   Lab Results  Component Value Date   ALT 10 03/13/2017   AST 15 03/13/2017   ALKPHOS 78 03/13/2017   BILITOT 0.22 03/13/2017     RADIOGRAPHY: Ct Abdomen Pelvis Wo Contrast  Result Date: 03/10/2017 CLINICAL DATA:  Metastatic squamous cell carcinoma arising from the penis involving the pelvis and retroperitoneum. Cycle 3 for cisplatin and 5 FU Kelly chemotherapy. Renal insufficiency. EXAM: CT ABDOMEN AND PELVIS WITHOUT CONTRAST TECHNIQUE: Multidetector CT imaging of the abdomen and pelvis was performed following the standard protocol without IV contrast. COMPARISON:  11/17/2016 FINDINGS: Lower chest: Unremarkable Hepatobiliary: Unremarkable Pancreas: Unremarkable Spleen: Unremarkable Adrenals/Urinary Tract: Small calcification adjacent to a known parapelvic cyst in the left mid to lower kidney medially on image 36/2, probably simply a Bosniak category 2 cyst. No current hydronephrosis. Stomach/Bowel: Scattered descending colon diverticula. There are few small diverticula elsewhere in the colon. Appendix normal. No dilated bowel. Vascular/Lymphatic: Aortoiliac atherosclerotic vascular disease. Pathologic right retrocrural, periaortic, retrocaval, and left greater than right iliac and inguinal adenopathy. Conglomerate left periaortic adenopathy measures 3.2 cm in short axis on image 42/2, previously 2.2 cm by my measurement. Left external iliac node with faint irregular internal calcifications measures 4.5 cm in short axis on image 68/2, previously 4.1 cm. An individual cystic lymph node in the left groin (1 of numerous left inguinal lymph nodes) has a short axis diameter of 6.2 cm, formerly 4.1 cm. There is extensive edema in the left upper thigh tracking into the pubis and lateral to the left hip. This is increased compared to the prior exam and possibilities might include impaired lymphatic drainage due to the massive  adenopathy, or extrinsic venous compression involving the external iliac vein. I favor the former. Reproductive: Greater than expected fluid density tracking along the penile urethra, with somewhat irregular serpentine contour along the shaft of the penis. Prostate gland calcifications noted. Other: No supplemental non-categorized findings. 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 IMPRESSION: 1. Worsening retroperitoneal and left pelvic adenopathy with massive left external iliac and inguinal adenopathy. Prominent edema in the left thigh region extending up into the pubis and lateral to  the left hip likely due to lymphatic obstruction, less likely to be due to extrinsic venous compression by the adenopathy causing poor venous drainage. 2. Somewhat distended penile urethra with internal fluid density. 3.  Aortic Atherosclerosis (ICD10-I70.0). Electronically Signed   By: Van Clines M.D.   On: 03/10/2017 16:58      IMPRESSION/PLAN: 1. 41 y.o. 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 He has severe lymphedema of the left leg and may benefit from palliative radiation.   Today, we talked to the patient about the findings and work-up thus far.  We discussed the natural history of lymphedema from malignant lymphadenopathy and general treatment, highlighting the role of palliative radiotherapy in the management. We discussed the available radiation techniques, and focused on the details of logistics and delivery. The recommendation is for course of 10 daily treatments to a dose of 30 Gy delivered over the course of 2 weeks.  We reviewed the anticipated acute and late sequelae associated with radiation in this setting.The patient was encouraged to ask questions that were answered to the best of our ability.    At the end of  our conversation, the patient elects to proceed with palliative radiotherapy to the pelvic lymphadenopathy and is scheduled for CT simulation at 2pm on Tuesday, Dec. 18th.  He freely signed written consent today in the office and this document was placed in his chart.  We spent 60 minutes minutes face to face with the patient and more than 50% of that time was spent in counseling and/or coordination of care.    Nicholos Johns, PA-C    Tyler Pita, MD  [facility] Direct Dial: [phone]  Fax: [phone] .com  Skype  [email or web address]  This document serves as a record of services personally performed by Freeman Caldron, PA-C and Tyler Pita, MD. It was created on their behalf by Valeta Harms, a trained medical scribe. The creation of this record is based on the scribe's personal observations and the providers' statements to them. This document has been checked and approved by the attending provider.

## 2017-03-28 ENCOUNTER — Other Ambulatory Visit: Payer: Self-pay

## 2017-03-28 ENCOUNTER — Telehealth: Payer: Self-pay | Admitting: Oncology

## 2017-03-28 ENCOUNTER — Ambulatory Visit: Payer: Self-pay | Admitting: Oncology

## 2017-03-28 ENCOUNTER — Ambulatory Visit: Payer: Self-pay

## 2017-03-28 ENCOUNTER — Ambulatory Visit: Admission: RE | Admit: 2017-03-28 | Payer: Medicaid Other | Source: Ambulatory Visit | Admitting: Radiation Oncology

## 2017-03-28 ENCOUNTER — Telehealth: Payer: Self-pay | Admitting: *Deleted

## 2017-03-28 NOTE — Telephone Encounter (Signed)
Patient called in about car trouble I did transfer to nurse

## 2017-03-28 NOTE — Telephone Encounter (Signed)
Patient calling to say he has transportation issues. [place] sent to schedulers to r/s  Lab / dr / chemo.

## 2017-03-29 ENCOUNTER — Ambulatory Visit: Payer: Self-pay

## 2017-03-29 ENCOUNTER — Other Ambulatory Visit: Payer: Self-pay | Admitting: Oncology

## 2017-03-30 ENCOUNTER — Ambulatory Visit
Admission: RE | Admit: 2017-03-30 | Discharge: 2017-03-30 | Disposition: A | Discharge status: Home / Self Care | Payer: Medicaid Other | Source: Ambulatory Visit | Attending: Radiation Oncology | Admitting: Radiation Oncology

## 2017-03-30 DIAGNOSIS — C609 Malignant neoplasm of penis, unspecified: Secondary | ICD-10-CM

## 2017-03-30 DIAGNOSIS — Z510 Encounter for antineoplastic radiation therapy: Secondary | ICD-10-CM | POA: Diagnosis not present

## 2017-03-30 DIAGNOSIS — R590 Localized enlarged lymph nodes: Secondary | ICD-10-CM

## 2017-03-30 NOTE — Progress Notes (Signed)
  Radiation Oncology         [phone] ________________________________  Name: Ronald Wilson MRN: [id number]  Date: 03/30/2017  DOB: 1976/04/07  SIMULATION AND TREATMENT PLANNING NOTE    ICD-10-CM   1. Penis cancer ([place]) C60.9   2. Inguinal lymphadenopathy R59.0     DIAGNOSIS:  41 yo man with lymphedema from lymphadenopathy related to squamous cell carcinoma of the penis  NARRATIVE:  The patient was brought to the [facility].  Identity was confirmed.  All relevant records and images related to the planned course of therapy were reviewed.  The patient freely provided informed written consent to proceed with treatment after reviewing the details related to the planned course of therapy. The consent form was witnessed and verified by the simulation staff.  Then, the patient was set-up in a stable reproducible  supine position for radiation therapy.  CT images were obtained.  Surface markings were placed.  The CT images were loaded into the planning software.  Then the target and avoidance structures were contoured.  Treatment planning then occurred.  The radiation prescription was entered and confirmed.  Then, I designed and supervised the construction of a total of 4 medically necessary complex treatment devices including BodyFix positioner, and 3 MLC's to shield critical bowel.  I have requested : 3D Simulation  I have requested a DVH of the following structures: Kidneys, Bladder, Bowel, Hips and targets  PLAN:  The patient will receive 30 Gy in 10 fractions.  ________________________________  Sheral Apley Tammi Klippel, M.D.

## 2017-03-31 DIAGNOSIS — Z510 Encounter for antineoplastic radiation therapy: Secondary | ICD-10-CM | POA: Diagnosis not present

## 2017-04-05 ENCOUNTER — Ambulatory Visit: Payer: Medicaid Other

## 2017-04-05 DIAGNOSIS — Z510 Encounter for antineoplastic radiation therapy: Secondary | ICD-10-CM | POA: Diagnosis not present

## 2017-04-06 ENCOUNTER — Ambulatory Visit
Admission: RE | Admit: 2017-04-06 | Discharge: 2017-04-06 | Disposition: A | Discharge status: Home / Self Care | Payer: Medicaid Other | Source: Ambulatory Visit | Attending: Radiation Oncology | Admitting: Radiation Oncology

## 2017-04-06 DIAGNOSIS — Z510 Encounter for antineoplastic radiation therapy: Secondary | ICD-10-CM | POA: Diagnosis not present

## 2017-04-07 ENCOUNTER — Ambulatory Visit
Admission: RE | Admit: 2017-04-07 | Discharge: 2017-04-07 | Disposition: A | Discharge status: Home / Self Care | Payer: Medicaid Other | Source: Ambulatory Visit | Attending: Radiation Oncology | Admitting: Radiation Oncology

## 2017-04-07 ENCOUNTER — Ambulatory Visit: Payer: Self-pay

## 2017-04-07 DIAGNOSIS — Z510 Encounter for antineoplastic radiation therapy: Secondary | ICD-10-CM | POA: Diagnosis not present

## 2017-04-10 ENCOUNTER — Ambulatory Visit: Payer: Medicaid Other

## 2017-04-12 ENCOUNTER — Ambulatory Visit
Admission: RE | Admit: 2017-04-12 | Discharge: 2017-04-12 | Disposition: A | Discharge status: Home / Self Care | Payer: Medicaid Other | Source: Ambulatory Visit | Attending: Radiation Oncology | Admitting: Radiation Oncology

## 2017-04-12 DIAGNOSIS — Z510 Encounter for antineoplastic radiation therapy: Secondary | ICD-10-CM | POA: Diagnosis not present

## 2017-04-13 ENCOUNTER — Ambulatory Visit
Admission: RE | Admit: 2017-04-13 | Discharge: 2017-04-13 | Disposition: A | Discharge status: Home / Self Care | Payer: Medicaid Other | Source: Ambulatory Visit | Attending: Radiation Oncology | Admitting: Radiation Oncology

## 2017-04-13 DIAGNOSIS — Z510 Encounter for antineoplastic radiation therapy: Secondary | ICD-10-CM | POA: Diagnosis not present

## 2017-04-14 ENCOUNTER — Ambulatory Visit
Admission: RE | Admit: 2017-04-14 | Discharge: 2017-04-14 | Disposition: A | Discharge status: Home / Self Care | Payer: Medicaid Other | Source: Ambulatory Visit | Attending: Radiation Oncology | Admitting: Radiation Oncology

## 2017-04-14 DIAGNOSIS — Z510 Encounter for antineoplastic radiation therapy: Secondary | ICD-10-CM | POA: Diagnosis not present

## 2017-04-17 ENCOUNTER — Ambulatory Visit
Admission: RE | Admit: 2017-04-17 | Discharge: 2017-04-17 | Disposition: A | Discharge status: Home / Self Care | Payer: Medicaid Other | Source: Ambulatory Visit | Attending: Radiation Oncology | Admitting: Radiation Oncology

## 2017-04-17 DIAGNOSIS — Z510 Encounter for antineoplastic radiation therapy: Secondary | ICD-10-CM | POA: Diagnosis not present

## 2017-04-18 ENCOUNTER — Ambulatory Visit
Admission: RE | Admit: 2017-04-18 | Discharge: 2017-04-18 | Disposition: A | Discharge status: Home / Self Care | Payer: Medicaid Other | Source: Ambulatory Visit | Attending: Radiation Oncology | Admitting: Radiation Oncology

## 2017-04-18 DIAGNOSIS — Z510 Encounter for antineoplastic radiation therapy: Secondary | ICD-10-CM | POA: Diagnosis not present

## 2017-04-19 ENCOUNTER — Ambulatory Visit: Payer: Medicaid Other

## 2017-04-19 ENCOUNTER — Ambulatory Visit
Admission: RE | Admit: 2017-04-19 | Discharge: 2017-04-19 | Disposition: A | Discharge status: Home / Self Care | Payer: Medicaid Other | Source: Ambulatory Visit | Attending: Radiation Oncology | Admitting: Radiation Oncology

## 2017-04-19 DIAGNOSIS — Z510 Encounter for antineoplastic radiation therapy: Secondary | ICD-10-CM | POA: Diagnosis not present

## 2017-04-20 ENCOUNTER — Encounter: Payer: Self-pay | Admitting: Radiation Oncology

## 2017-04-20 ENCOUNTER — Ambulatory Visit
Admission: RE | Admit: 2017-04-20 | Discharge: 2017-04-20 | Disposition: A | Discharge status: Home / Self Care | Payer: Medicaid Other | Source: Ambulatory Visit | Attending: Radiation Oncology | Admitting: Radiation Oncology

## 2017-04-20 DIAGNOSIS — Z510 Encounter for antineoplastic radiation therapy: Secondary | ICD-10-CM | POA: Diagnosis not present

## 2017-04-21 NOTE — Progress Notes (Signed)
  Radiation Oncology         [phone] ________________________________  Name: Ronald Wilson MRN: [id number]  Date: 04/20/2017  DOB: 12-04-1975  End of Treatment Note  Diagnosis:   42 y.o. male with lymphedema from lymphadenopathy related to squamous cell carcinoma of the penis     Indication for treatment:  Palliative       Radiation treatment dates:   04/05/2017 - 04/20/2017  Site/dose:   The pelvis/paraaortic was treated to 30 Gy in 10 fractions of 3 Gy.  Beams/energy:   3D / 6X, 15X Photon  Narrative: The patient tolerated radiation treatment relatively well.   He experienced mild urinary symptoms of nocturia x2-3 and occasional hesitancy. He reported intermittent penile pain, managed with ibuprofen, but denied any dysuria or hematuria. He denied any bowel issues. He reported unchanged lymphedema in his left leg but did have improved lymphedema in his left foot.  Plan: The patient has completed radiation treatment. The patient will return to radiation oncology clinic for routine followup in one month. I advised him to call or return sooner if he has any questions or concerns related to his recovery or treatment. ________________________________  Sheral Apley. Tammi Klippel, M.D.  This document serves as a record of services personally performed by Tyler Pita, MD. It was created on his behalf by Rae Lips, a trained medical scribe. The creation of this record is based on the scribe's personal observations and the provider's statements to them. This document has been checked and approved by the attending provider.

## 2017-05-23 ENCOUNTER — Encounter: Payer: Self-pay | Admitting: Urology

## 2017-05-23 ENCOUNTER — Other Ambulatory Visit: Payer: Self-pay

## 2017-05-23 ENCOUNTER — Encounter (HOSPITAL_COMMUNITY): Payer: Self-pay

## 2017-05-23 ENCOUNTER — Ambulatory Visit (HOSPITAL_COMMUNITY): Admission: RE | Admit: 2017-05-23 | Payer: MEDICAID | Source: Ambulatory Visit

## 2017-05-23 ENCOUNTER — Ambulatory Visit
Admission: RE | Admit: 2017-05-23 | Discharge: 2017-05-23 | Disposition: A | Discharge status: Home / Self Care | Payer: Medicaid Other | Source: Ambulatory Visit | Attending: Urology | Admitting: Urology

## 2017-05-23 VITALS — BP 152/104 | HR 122 | Temp 99.3°F | Resp 20 | Ht 68.0 in | Wt 196.4 lb

## 2017-05-23 DIAGNOSIS — I890 Lymphedema, not elsewhere classified: Secondary | ICD-10-CM | POA: Insufficient documentation

## 2017-05-23 DIAGNOSIS — C609 Malignant neoplasm of penis, unspecified: Secondary | ICD-10-CM

## 2017-05-23 DIAGNOSIS — Z79899 Other long term (current) drug therapy: Secondary | ICD-10-CM | POA: Insufficient documentation

## 2017-05-23 DIAGNOSIS — R000 Tachycardia, unspecified: Secondary | ICD-10-CM | POA: Diagnosis not present

## 2017-05-23 DIAGNOSIS — Z923 Personal history of irradiation: Secondary | ICD-10-CM | POA: Diagnosis not present

## 2017-05-23 DIAGNOSIS — R600 Localized edema: Secondary | ICD-10-CM

## 2017-05-23 DIAGNOSIS — C778 Secondary and unspecified malignant neoplasm of lymph nodes of multiple regions: Secondary | ICD-10-CM | POA: Diagnosis not present

## 2017-05-23 NOTE — Progress Notes (Signed)
Radiation Oncology         [phone] ________________________________  Name: Ronald Wilson MRN: [id number]  Date: 05/23/2017  DOB: 1975-07-17  Post Treatment Note  CC: [name], FNP  [name], FNP  Diagnosis:   42 y.o. male with lymphedema from lymphadenopathy related to squamous cell carcinoma of the penis     Interval Since Last Radiation:  4.5 weeks  04/05/2017 - 04/20/2017:   The pelvis/paraaortic was treated to 30 Gy in 10 fractions of 3 Gy.  Narrative:  The patient returns today for routine follow-up.  He tolerated radiation treatment relatively well.   He experienced mild urinary symptoms of nocturia x2-3 and occasional hesitancy as well as intermittent penile pain, managed with ibuprofen, but denied any dysuria or hematuria. He denied any bowel issues. He reported unchanged lymphedema in his left leg but did have improved lymphedema in his left foot.           In brief summary, the patient presented to the ED on 11/17/2016 with symptoms of progressive lower extremity edema, predominantly on the left. 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 There was prominent edema in the left thigh region extending up into the pubis and lateral to the left hip likely due to lymphatic obstruction, less likely to be due to extrinsic venous compression by the adenopathy causing poor venous drainage.   These findings were felt to be the likely culprit for the 3+ edema in the left lower extremity which had been progressively worsening over the prior several weeks. He met with Dr.Shadad on  03/13/17 and the plan was to switch the patient's systemic therapy to carboplatin and paclitaxel but his first dose has not yet been scheduled due to transportation/social issues.          On review of systems, the patient states that he is not had any ill side effects from his recent radiotherapy.  He had noticed some decreased swelling in the left foot but has had persistent swelling in the left groin, thigh and calf, unchanged.  Over the past 2 days, he has noticed progressive swelling in the left axilla and left upper extremity without associated erythema and no recent injury.  He denies pain in the left upper extremity but reports that it is "tight and achy".  He denies any swelling in his neck or difficulty breathing.  He denies chest pain or shortness of breath.  He has not had recent fevers, chills or night sweats.  He denies difficulty with urination- specifically denies dysuria, hematuria, increased frequency or urgency heand feels that he is emptying his bladder well on voiding.  He is having progressive pain in the tip of his penis due to the penile lesion, with known squamous cell carcinoma.  ALLERGIES:  has No Known Allergies.  Meds: Current Outpatient Medications  Medication Sig Dispense Refill  . lidocaine-prilocaine (EMLA) cream Apply 1 application topically as needed. Apply to porta cath before chemotherapy. 30 g 0  . amLODipine (NORVASC) 10 MG tablet Take 1 tablet (10 mg total) by mouth daily. (Patient not taking: Reported on 03/23/2017) 30 tablet 5  . prochlorperazine (COMPAZINE) 10 MG tablet Take 1  tablet (10 mg total) by mouth every 6 (six) hours as needed for nausea or vomiting. (Patient not taking: Reported on 03/23/2017) 30 tablet 0   No current facility-administered medications for this encounter.     Physical Findings:  height is 5' 8"  (1.727 m) and weight is 196 lb 6.4 oz (89.1 kg). His oral temperature is 99.3 F (37.4 C). His blood pressure is 152/104 (abnormal) and his  pulse is 122 (abnormal). His respiration is 20 and oxygen saturation is 100%.  Pain Assessment Pain Score: 3  Pain Loc: Arm(Left )/10 In general this is a well appearing African-American male in no acute distress.  He's alert and oriented x4 and appropriate throughout the examination. Cardiopulmonary assessment is negative for acute distress and he exhibits normal effort.  Cardiac assessment reveals a tachy rate with regular rhythm and no murmurs rubs or gallops.  His lungs are clear to auscultation bilaterally.  There is diffuse edema in the left axilla into the anterior chest wall, breast and supraclavicular region-likely secondary to new axillary lymphadenopathy but I am not able to palpate any specific lymph nodes due to the associated edema.  There is no erythema or increased warmth.  If this is non-tender to palpation.  There is 2+ edema in the left upper extremity from the shoulder into the fingers.  Radial and brachial pulses are palpated and equal bilaterally in the upper extremities.  He continues with 2+ edema in the left lower extremity from the groin down to the calf.  There is no calf pain with deep palpation on the left.  Sensation is intact to light touch in both the left upper and lower extremities.  The right upper and lower extremities are without edema.  The penile lesion has progressed significantly, now involving at least three fourths of the glans penis and extending into the distal shaft.  Lab Findings: Lab Results  Component Value Date   WBC 9.1 03/13/2017   HGB 8.2 (L) 03/13/2017   HCT 24.6 (L) 03/13/2017   MCV 84.9 03/13/2017   PLT 299 03/13/2017     Radiographic Findings: No results found.  Impression/Plan: 1. 42 y.o. male with lymphedema from lymphadenopathy related to squamous cell carcinoma of the penis. He appears to have recovered well from the effects of his recent radiotherapy and did not suffer any significant ill side effects.  However, due to transportation  and other social issues, he has been unable to follow-up with medical oncology for further systemic treatment.  Over the past 4 weeks, he has noticed progressive worsening of the penile lesion causing increasing discomfort.  Over the past 2 days, he has noticed progressive swelling in the left upper extremity and left axilla. I suspect this is a reflection of disease progression and recommended that he be evaluated through the emergency department with venous Doppler ultrasound of the left upper extremity to rule out DVT and also for CT chest to evaluate for potential progressive disease in the left axilla contributing to the significant lymphadenopathy/lymphedema.  Unfortunately, due to the need to get back home to his disabled father who he is the sole caregiver for, he refused ER evaluation.  We attempted to make arrangements for him to have the ultrasound and CT studies performed today as an outpatient but were unsuccessful with obtaining preauthorization in a timely manner that the vascular lab could accommodate.  Therefore, he will be scheduled for the studies on 05/24/2017 as an outpatient.  He is advised to proceed directly to the  emergency department should he develop chest pain, shortness of breath or increased pain or paresthesias in the left upper extremity.  He states his understanding and compliance.  I have also expressed the need to get him back connected with Dr. Alen Blew in medical oncology for further evaluation and treatment promptly.  I will make a referral to social work to see if there are any resources to assist him with the care of his father to allow him to attend to his own medical needs.  At this point, we will plan to see him back on an as-needed basis but are happy to participate in his care further if clinically indicated.  His routine follow-up and disease management will be under the care and direction of Dr. Alen Blew.     Nicholos Johns, PA-C

## 2017-05-24 ENCOUNTER — Telehealth: Payer: Self-pay | Admitting: *Deleted

## 2017-05-24 ENCOUNTER — Ambulatory Visit (HOSPITAL_BASED_OUTPATIENT_CLINIC_OR_DEPARTMENT_OTHER)
Admission: RE | Admit: 2017-05-24 | Discharge: 2017-05-24 | Disposition: A | Discharge status: Home / Self Care | Payer: Medicaid Other | Source: Ambulatory Visit | Attending: Radiation Oncology | Admitting: Radiation Oncology

## 2017-05-24 ENCOUNTER — Other Ambulatory Visit: Payer: Self-pay | Admitting: Urology

## 2017-05-24 ENCOUNTER — Ambulatory Visit (HOSPITAL_COMMUNITY)
Admission: RE | Admit: 2017-05-24 | Discharge: 2017-05-24 | Disposition: A | Discharge status: Home / Self Care | Payer: Medicaid Other | Source: Ambulatory Visit | Attending: Radiation Oncology | Admitting: Radiation Oncology

## 2017-05-24 ENCOUNTER — Telehealth: Payer: Self-pay | Admitting: Oncology

## 2017-05-24 ENCOUNTER — Encounter: Payer: Self-pay | Admitting: Urology

## 2017-05-24 DIAGNOSIS — I82622 Acute embolism and thrombosis of deep veins of left upper extremity: Secondary | ICD-10-CM | POA: Insufficient documentation

## 2017-05-24 DIAGNOSIS — I2699 Other pulmonary embolism without acute cor pulmonale: Secondary | ICD-10-CM | POA: Diagnosis not present

## 2017-05-24 DIAGNOSIS — C609 Malignant neoplasm of penis, unspecified: Secondary | ICD-10-CM

## 2017-05-24 DIAGNOSIS — K869 Disease of pancreas, unspecified: Secondary | ICD-10-CM | POA: Insufficient documentation

## 2017-05-24 MED ORDER — IOPAMIDOL (ISOVUE-370) INJECTION 76%
100.0000 mL | Freq: Once | INTRAVENOUS | Status: AC | PRN
  Administered 2017-05-24: 66 mL via INTRAVENOUS

## 2017-05-24 MED ORDER — OXYCODONE-ACETAMINOPHEN 5-325 MG PO TABS: 1.0000 | ORAL_TABLET | ORAL | 0 refills | Status: DC | PRN

## 2017-05-24 MED ORDER — RIVAROXABAN (XARELTO) VTE STARTER PACK (15 & 20 MG): ORAL_TABLET | ORAL | 0 refills | Status: DC

## 2017-05-24 MED ORDER — ENOXAPARIN SODIUM 30 MG/0.3ML ~~LOC~~ SOLN: 100.0000 mg | Freq: Two times a day (BID) | SUBCUTANEOUS | 0 refills | Status: DC

## 2017-05-24 MED ORDER — LIDOCAINE HCL 2 % EX GEL: 1.0000 "application " | CUTANEOUS | 1 refills | Status: DC | PRN

## 2017-05-24 MED ORDER — IOPAMIDOL (ISOVUE-370) INJECTION 76%
INTRAVENOUS | Status: AC
  Filled 2017-05-24: qty 100

## 2017-05-24 MED FILL — XARELTO STARTER PACK: 15 & 20 | 30 days supply | Qty: 51 | Fill #0

## 2017-05-24 MED FILL — LIDOCAINE 5% OINTMENT: 5 | 10 days supply | Qty: 30 | Fill #0

## 2017-05-24 MED FILL — OXYCOD/ACETAMINOPHEN 5-325M: 5-325 | 15 days supply | Qty: 90 | Fill #0

## 2017-05-24 MED FILL — ENOXAPARIN 100 MG/ML SYR: 100 | 4 days supply | Qty: 9 | Fill #0

## 2017-05-24 NOTE — Telephone Encounter (Signed)
Scheduled appts per 2/12 sch msg - spoke with patient regarding appts.  °

## 2017-05-24 NOTE — Progress Notes (Signed)
Left upper extremity venous duplex completed. Positive for acute occlusive DVT of the proximal brachial, axillary, subclavian, and internal jugular veins. There is no obvious evidence of propagation to the right at this time. There is no evidence of a superficial thrombosis. [facility]. RVS 05/24/2017, 2:25 PM

## 2017-05-24 NOTE — Progress Notes (Signed)
I received a call report from the vascular lab regarding this patient's recent left upper extremity venous Doppler ultrasound.  By report, he has significant, occlusive DVT in the left upper extremity.  He also had a CT angio chest today and was found to have 2 small pulmonary emboli bilaterally.  I met with the patient today in clinic following his imaging studies and personally discussed these findings with him.  I called and consulted with Van Tanner, PA-C in the symptom management clinic, medical oncology with Dr. Shadad.  Van recommended that I start the patient on 100 mg of Lovenox twice daily for 4 days.  The prescription was called to the [place] outpatient pharmacy where the patient will be taught self injection and will receive his first dose of Lovenox today.  He will remain on twice daily dosing through Sunday 05/28/17.  On Monday, 05/29/2017, we will begin taking Xarelto 15 mg p.o. twice daily for 3 weeks and then 20 mg daily thereafter.  A prescription for Xarelto starter pack was sent to the [place] outpatient pharmacy as well with a start date of 05/29/2017.  The patient appears to have a good understanding of his current condition and our treatment recommendations and states his willingness to comply. Additionally, the patient was requesting medication to help with the penile pain.  I sent a prescription for Percocet 5/325 mg to be taken p.o. every 4-6 hours as needed pain.  I also sent a prescription for topical lidocaine to be used as needed for pain relief. He has a follow-up appointment scheduled with Dr. Shadad on Tuesday, 05/30/2017 which he intends to keep. We will plan to see him back on an as-needed basis should there be any indication for further radiotherapy in the future.  He knows to call at anytime with any questions or concerns related to his previous radiotherapy.   Ashlyn W. Bruning, PA-C  

## 2017-05-24 NOTE — Telephone Encounter (Signed)
Called patient to inform of doppler @ 1pm on 05-24-17 - arrival time - 12:45 pm @ [facility] Admitting and his CT today - arrival time - 3:15 pm @ [facility], pt. To have water only starting @ 11:30 am, spoke with patient and he is aware of these appts.

## 2017-05-30 ENCOUNTER — Inpatient Hospital Stay: Payer: Medicaid Other | Attending: Oncology | Admitting: Oncology

## 2017-05-30 VITALS — BP 167/97 | HR 119 | Temp 98.8°F | Resp 20 | Ht 69.0 in | Wt 204.9 lb

## 2017-05-30 DIAGNOSIS — D649 Anemia, unspecified: Secondary | ICD-10-CM | POA: Insufficient documentation

## 2017-05-30 DIAGNOSIS — Z7901 Long term (current) use of anticoagulants: Secondary | ICD-10-CM | POA: Diagnosis not present

## 2017-05-30 DIAGNOSIS — Z9221 Personal history of antineoplastic chemotherapy: Secondary | ICD-10-CM | POA: Insufficient documentation

## 2017-05-30 DIAGNOSIS — C609 Malignant neoplasm of penis, unspecified: Secondary | ICD-10-CM | POA: Diagnosis present

## 2017-05-30 DIAGNOSIS — D573 Sickle-cell trait: Secondary | ICD-10-CM | POA: Insufficient documentation

## 2017-05-30 DIAGNOSIS — Z923 Personal history of irradiation: Secondary | ICD-10-CM | POA: Diagnosis not present

## 2017-05-30 DIAGNOSIS — I10 Essential (primary) hypertension: Secondary | ICD-10-CM

## 2017-05-30 DIAGNOSIS — I82622 Acute embolism and thrombosis of deep veins of left upper extremity: Secondary | ICD-10-CM | POA: Insufficient documentation

## 2017-05-30 DIAGNOSIS — C786 Secondary malignant neoplasm of retroperitoneum and peritoneum: Secondary | ICD-10-CM | POA: Insufficient documentation

## 2017-05-30 DIAGNOSIS — N289 Disorder of kidney and ureter, unspecified: Secondary | ICD-10-CM | POA: Diagnosis not present

## 2017-05-30 MED ORDER — AMLODIPINE BESYLATE 10 MG PO TABS: 10.0000 mg | ORAL_TABLET | Freq: Every day | ORAL | 5 refills | Status: DC

## 2017-05-30 MED FILL — AMLODIPINE BESYLATE 10 MG T: 10 | 30 days supply | Qty: 30 | Fill #0

## 2017-05-30 NOTE — Progress Notes (Signed)
Hematology and Oncology Follow Up Visit  Ronald Wilson [id number] 03-Oct-1975 42 y.o. 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   Prior Therapy:   He is status post a biopsy of his lymphadenopathy in August 2018.  He is S/P cisplatin and 5-FU palliative chemotherapy.  He completed 3 cycles in November 2018 and developed progression of disease.  He is status post palliative radiation therapy between April 05, 2017 and April 20, 2017.  He received 30 Gy in 10 fractions to the pelvis and the periaortic areas.  Current therapy:   Under evaluation for different salvage therapy.  Interim History: Ronald Wilson is here for a follow-up visit.  He is a pleasant gentleman with advanced squamous cell carcinoma of the penis that has progressed on his left regimen of chemotherapy but has failed to show up on subsequent appointments.  He completed radiation in January 2019 without any significant improvement in his pelvic adenopathy.  In the last few weeks he started developing left upper extremity swelling and was evaluated urgently on 05/24/2017.  He had an ultrasound Doppler which showed extensive deep vein thrombosis of his left upper extremity veins.  CT scan of the chest showed also small pulmonary emboli.  He was started on Lovenox and currently on Xarelto.  Clinically, he reports slight fatigue and overall weakness.  He still able to ambulate and attends to activities of daily living.  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  He does report discomfort related to his pelvic adenopathy but no other complaints.  He denies any neurological deficits.  He denied any hemoptysis or dyspnea on exertion.   He does not report any headaches, blurry vision, syncope or seizures. He does not report any fevers,  chills or sweats.  His appetite is reasonable and weight is stable.  He does not report any cough, wheezing or hemoptysis.  He does not report any chest pain, palpitation he and has bilateral lower extremity edema.  He does not report any nausea vomiting or abdominal pain. He does not report any constipation, diarrhea or over the satiety. He does not report any hematochezia or melena. He does not report any hematuria or dysuria.  He does not report any frequency urgency or hesitancy.  He does not report any arthralgias or myalgias.  He does not report any heat or cold intolerance.  He does not report any skin rashes or lesions.  Remaining review of systems is negative.  Medications: I have reviewed the patient's current medications.  Current Outpatient Medications  Medication Sig Dispense Refill  . amLODipine (NORVASC) 10 MG tablet Take 1 tablet (10 mg total) by mouth daily. 30 tablet 5  . lidocaine (XYLOCAINE) 2 % jelly Apply 1 application topically as needed. 30 mL 1  . lidocaine-prilocaine (EMLA) cream Apply 1 application topically as needed. Apply to porta cath before chemotherapy. 30 g 0  . oxyCODONE-acetaminophen (PERCOCET/ROXICET) 5-325 MG tablet Take 1 tablet by mouth every 4 (four) hours as needed for severe pain. 90 tablet 0  . prochlorperazine (COMPAZINE) 10 MG tablet Take 1 tablet (10 mg total) by mouth every 6 (six) hours as needed for nausea or vomiting. 30 tablet 0  . Rivaroxaban 15 & 20 MG TBPK Take as directed on package: One 37m tablet by mouth twice a day with food for 3 weeks,  then one 45m tablet once a day with food. 51 each 0  . enoxaparin (LOVENOX) 30 MG/0.3ML injection Inject 1 mL (100 mg total) into the skin every 12 (twelve) hours for 9 doses. 9 Syringe 0   No current facility-administered medications for this visit.      Allergies: No Known Allergies  Past Medical History, Surgical history, Social history, and Family History reviewed today and unchanged.  .Marland KitchenPhysical  Exam: Blood pressure (!) 167/97, pulse (!) 119, temperature 98.8 F (37.1 C), temperature source Oral, resp. rate 20, height _0  (1.753 m), weight 204 lb 14.4 oz (92.9 kg), SpO2 100 %. ECOG: 0 General appearance: Alert, comfortable gentleman appeared without distress today. Head: Normal without any deformities. Oropharynx: No oral ulcers or lesions.  Mucous membranes are moist and pink. Lymph nodes: No lymphadenopathy noted in the axillary or supraclavicular areas. Heart:regular rate and rhythm, S1, S2 normal, no murmur, click, rub or gallop Lung: Clear without any rhonchi, wheezes or dullness to percussion. Abdomin: Soft, nontender without any rebound or guarding.  Good bowel sounds auscultated. GU exam: Large mass palpated around the left inguinal canal. Musculoskeletal no joint deformity or effusion.  Left upper extremity edema and bilateral lower extremity edema noted. Skin: No rashes or lesions. Neurological: No motor, sensory or deep tendon reflex abnormalities.   Lab Results: Lab Results  Component Value Date   WBC 9.1 03/13/2017   HGB 8.2 (L) 03/13/2017   HCT 24.6 (L) 03/13/2017   MCV 84.9 03/13/2017   PLT 299 03/13/2017     Chemistry      Component Value Date/Time   NA 140 03/13/2017 0827   K 3.6 03/13/2017 0827   CL 106 11/28/2016 1044   CO2 28 03/13/2017 0827   BUN 15.6 03/13/2017 0827   CREATININE 2.0 (H) 03/13/2017 0827      Component Value Date/Time   CALCIUM 10.0 03/13/2017 0827   ALKPHOS 78 03/13/2017 0827   AST 15 03/13/2017 0827   ALT 10 03/13/2017 0827   BILITOT 0.22 03/13/2017 0827      CT ANGIOGRAPHY CHEST WITH CONTRAST  TECHNIQUE: Multidetector CT imaging of the chest was performed using the standard protocol during bolus administration of intravenous contrast. Multiplanar CT image reconstructions and MIPs were obtained to evaluate the vascular anatomy.  CONTRAST:  618mISOVUE-370 IOPAMIDOL (ISOVUE-370) INJECTION 76%  COMPARISON:   Chest CT 12/13/2016  FINDINGS: Cardiovascular: Small filling defect within the segmental branch of the RIGHT lower lobe, partially occlusive. Small the filling defect within the proximal segmental branch of the lingula (image 43, series 4). Partially occlusive. No RIGHT upper lobe or LEFT upper lobe emboli clearly identified.  RIGHT ventricular to LEFT ventricular ratio is less than 1 indicating no CT evidence of RIGHT ventricular strain.  Overall clot burden is mild.  Port in the RIGHT chest wall  Mediastinum/Nodes: There is haziness in the anterior mediastinum no lymphadenopathy. No pericardial effusion  Lungs/Pleura: No pulmonary infarction. No pleural fluid. Infiltrate or edema.  Upper Abdomen: There is haziness about the pancreatic head second portion duodenum (image 10, series 4  Musculoskeletal: No aggressive osseous lesion  Review of the MIP images confirms the above findings.  IMPRESSION: 1. Bilateral small segmental pulmonary emboli which are partially occlusive. Overall clot burden mild. 2. No CT evidence of RIGHT ventricular strain. 3. No pulmonary infarction or infiltrate. 4. Inflamed retroperitoneal fat along the second portion duodenum and pancreas. Recommend clinical correlation pancreatitis or duodenitis.  Critical Value/emergent results were called by  telephone at the time of interpretation on 05/24/2017 at 3:10 pm to Dr. Shona Simpson , who verbally acknowledged these results.   Impression and Plan:  42 year old gentleman with the following issues:  1.  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  Diagnosed in August 2018 and his disease has been chemotherapy refractory.   He received palliative radiation therapy in January 2019 without any significant improvement in his pelvic adenopathy.  The natural course of this disease was discussed again with Mr. Greenhaw and unfortunately his  disease appears to be progressing despite chemotherapy and radiation.  The prognosis associated with these findings very poor with life expectancy of around 6 months.  However, his disease appears to be progressing rather slowly and his performance status remained adequate and still is desiring aggressive treatments.  His options at this point would be supportive care and hospice, systemic chemotherapy utilizing carboplatin and Taxol, consideration for immunotherapy or oral targeted therapy after obtaining a repeat biopsy.  After discussion today, he is open for any additional therapy at this time.  I doubt traditional chemotherapy will be of help given his cisplatin refractory status.  The plan is to obtain imaging studies of the abdomen and pelvis and consider a repeat biopsy and evaluate that sample for PDL 1 status, tumor mutational burden and potential any other targetable mutation that we can use for a palliative approach.  I am not optimistic about able to palliate his disease moving forward.  2. IV access: Port-A-Cath will be flushed periodically.  3. Renal function surveillance: His creatinine last checked in December 2018 showed some renal dysfunction.  We will repeat his creatinine as well as laboratory testing in the immediate future.  5.  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  We will repeat his labs in the near future and transfuse as needed.  6.  Left upper extremity deep vein thrombosis: He is currently on Xarelto and will remain on it indefinitely.  He is taking 15 mg twice a day for the next 3 weeks and will resume 20 mg dosing after that.  7.  Prognosis: Usually poor with this malignancy although he has reasonable performance status and would like to try aggressive therapy.  He would likely require hospice in the immediate future if treatment is unsuccessful in palliating his disease.  8. Follow-up: Will be in in the next few weeks after obtaining imaging  studies and repeat biopsy.  25  minutes was spent with the patient face-to-face today.  More than 50% of time was dedicated to patient counseling, education and coordination of his multifaceted care.   Zola Button, MD 2/19/201910:46 AM

## 2017-05-31 ENCOUNTER — Telehealth: Payer: Self-pay

## 2017-05-31 NOTE — Telephone Encounter (Signed)
NO los per 2/19 

## 2017-06-06 ENCOUNTER — Encounter (HOSPITAL_COMMUNITY): Payer: Self-pay

## 2017-06-06 ENCOUNTER — Inpatient Hospital Stay: Payer: Medicaid Other

## 2017-06-06 ENCOUNTER — Ambulatory Visit (HOSPITAL_COMMUNITY)
Admission: RE | Admit: 2017-06-06 | Discharge: 2017-06-06 | Disposition: A | Discharge status: Home / Self Care | Payer: Medicaid Other | Source: Ambulatory Visit | Attending: Oncology | Admitting: Oncology

## 2017-06-06 ENCOUNTER — Encounter: Payer: Self-pay | Admitting: *Deleted

## 2017-06-06 DIAGNOSIS — R590 Localized enlarged lymph nodes: Secondary | ICD-10-CM | POA: Insufficient documentation

## 2017-06-06 DIAGNOSIS — C609 Malignant neoplasm of penis, unspecified: Secondary | ICD-10-CM | POA: Diagnosis present

## 2017-06-06 LAB — CBC WITH DIFFERENTIAL (CANCER CENTER ONLY)
BASOS ABS: 0.1 10*3/uL (ref 0.0–0.1)
BASOS PCT: 1 %
EOS ABS: 0.1 10*3/uL (ref 0.0–0.5)
EOS PCT: 1 %
HCT: 21.4 % — ABNORMAL LOW (ref 38.4–49.9)
Hemoglobin: 6.8 g/dL — CL (ref 13.0–17.1)
LYMPHS ABS: 0.8 10*3/uL — AB (ref 0.9–3.3)
Lymphocytes Relative: 6 %
MCH: 26.3 pg — AB (ref 27.2–33.4)
MCHC: 31.6 g/dL — ABNORMAL LOW (ref 32.0–36.0)
MCV: 83.3 fL (ref 79.3–98.0)
Monocytes Absolute: 0.8 10*3/uL (ref 0.1–0.9)
Monocytes Relative: 5 %
Neutro Abs: 12.2 10*3/uL — ABNORMAL HIGH (ref 1.5–6.5)
Neutrophils Relative %: 87 %
PLATELETS: 574 10*3/uL — AB (ref 140–400)
RBC: 2.57 MIL/uL — AB (ref 4.20–5.82)
RDW: 18.9 % — ABNORMAL HIGH (ref 11.0–14.6)
WBC: 14 10*3/uL — AB (ref 4.0–10.3)

## 2017-06-06 LAB — CMP (CANCER CENTER ONLY)
ALK PHOS: 85 U/L (ref 40–150)
ALT: 9 U/L (ref 0–55)
AST: 14 U/L (ref 5–34)
Albumin: 3.2 g/dL — ABNORMAL LOW (ref 3.5–5.0)
Anion gap: 12 — ABNORMAL HIGH (ref 3–11)
BUN: 9 mg/dL (ref 7–26)
CALCIUM: 9.9 mg/dL (ref 8.4–10.4)
CO2: 26 mmol/L (ref 22–29)
CREATININE: 1.82 mg/dL — AB (ref 0.70–1.30)
Chloride: 102 mmol/L (ref 98–109)
GFR, EST NON AFRICAN AMERICAN: 45 mL/min — AB (ref >60)
GFR, Est AFR Am: 52 mL/min — ABNORMAL LOW (ref >60)
Glucose, Bld: 114 mg/dL (ref 70–140)
Potassium: 3.6 mmol/L (ref 3.5–5.1)
Sodium: 140 mmol/L (ref 136–145)
TOTAL PROTEIN: 7.7 g/dL (ref 6.4–8.3)
Total Bilirubin: 0.3 mg/dL (ref 0.2–1.2)

## 2017-06-06 MED ORDER — IOPAMIDOL (ISOVUE-300) INJECTION 61%
INTRAVENOUS | Status: AC
  Filled 2017-06-06: qty 100

## 2017-06-06 MED ORDER — IOPAMIDOL (ISOVUE-300) INJECTION 61%
100.0000 mL | Freq: Once | INTRAVENOUS | Status: AC | PRN
  Administered 2017-06-06: 100 mL via INTRAVENOUS

## 2017-06-07 ENCOUNTER — Other Ambulatory Visit: Payer: Self-pay | Admitting: Oncology

## 2017-06-07 DIAGNOSIS — C609 Malignant neoplasm of penis, unspecified: Secondary | ICD-10-CM

## 2017-06-13 ENCOUNTER — Telehealth: Payer: Self-pay

## 2017-06-13 ENCOUNTER — Telehealth: Payer: Self-pay | Admitting: *Deleted

## 2017-06-13 ENCOUNTER — Other Ambulatory Visit: Payer: Self-pay

## 2017-06-13 MED ORDER — OXYCODONE-ACETAMINOPHEN 5-325 MG PO TABS: 1.0000 | ORAL_TABLET | ORAL | 0 refills | Status: DC | PRN

## 2017-06-13 NOTE — Telephone Encounter (Signed)
Called requesting refill on  oxycodone.

## 2017-06-13 NOTE — Telephone Encounter (Signed)
CALLED PATIENT TO INFORM PER ASHLYN BRUNING THAT HE NEEDS TO CALL DR. SHADAD FOR PAIN MEDS., SINCE HE IS NOT TAKING RADIATION ANY LONGER, SPOKE WITH PATIENT AND HE VERIFIED UNDERSTANDING THIS

## 2017-06-13 NOTE — Telephone Encounter (Signed)
Called and told Rx ready for pick-up. 

## 2017-06-15 ENCOUNTER — Encounter: Payer: Self-pay | Admitting: Oncology

## 2017-06-15 NOTE — Progress Notes (Signed)
Received PA request for Oxycodone/Acetaminophen with different insurance id as the Medicaid.  Called [facility](Angela) to provide Medicaid id number and she states it went through fine with Medicaid number and she will get ready for patient and give him a call when ready. She states the insurance listed must have been old coverage.

## 2017-06-18 ENCOUNTER — Other Ambulatory Visit: Payer: Self-pay | Admitting: Radiology

## 2017-06-20 ENCOUNTER — Inpatient Hospital Stay: Payer: Medicaid Other | Attending: Oncology

## 2017-06-20 ENCOUNTER — Ambulatory Visit (HOSPITAL_COMMUNITY)
Admission: RE | Admit: 2017-06-20 | Discharge: 2017-06-20 | Disposition: A | Discharge status: Home / Self Care | Payer: Medicaid Other | Source: Ambulatory Visit | Attending: Oncology | Admitting: Oncology

## 2017-06-20 ENCOUNTER — Encounter (HOSPITAL_COMMUNITY): Payer: Self-pay

## 2017-06-20 ENCOUNTER — Telehealth: Payer: Self-pay | Admitting: *Deleted

## 2017-06-20 ENCOUNTER — Other Ambulatory Visit: Payer: Self-pay | Admitting: Oncology

## 2017-06-20 ENCOUNTER — Telehealth: Payer: Self-pay | Admitting: Oncology

## 2017-06-20 ENCOUNTER — Inpatient Hospital Stay: Payer: Medicaid Other

## 2017-06-20 DIAGNOSIS — D573 Sickle-cell trait: Secondary | ICD-10-CM | POA: Insufficient documentation

## 2017-06-20 DIAGNOSIS — L041 Acute lymphadenitis of trunk: Secondary | ICD-10-CM | POA: Diagnosis not present

## 2017-06-20 DIAGNOSIS — C774 Secondary and unspecified malignant neoplasm of inguinal and lower limb lymph nodes: Secondary | ICD-10-CM | POA: Diagnosis not present

## 2017-06-20 DIAGNOSIS — Z7901 Long term (current) use of anticoagulants: Secondary | ICD-10-CM | POA: Diagnosis not present

## 2017-06-20 DIAGNOSIS — Z923 Personal history of irradiation: Secondary | ICD-10-CM | POA: Diagnosis not present

## 2017-06-20 DIAGNOSIS — Z9221 Personal history of antineoplastic chemotherapy: Secondary | ICD-10-CM | POA: Insufficient documentation

## 2017-06-20 DIAGNOSIS — Z79899 Other long term (current) drug therapy: Secondary | ICD-10-CM | POA: Insufficient documentation

## 2017-06-20 DIAGNOSIS — D649 Anemia, unspecified: Secondary | ICD-10-CM

## 2017-06-20 DIAGNOSIS — I82622 Acute embolism and thrombosis of deep veins of left upper extremity: Secondary | ICD-10-CM | POA: Insufficient documentation

## 2017-06-20 DIAGNOSIS — N289 Disorder of kidney and ureter, unspecified: Secondary | ICD-10-CM | POA: Insufficient documentation

## 2017-06-20 DIAGNOSIS — C609 Malignant neoplasm of penis, unspecified: Secondary | ICD-10-CM | POA: Insufficient documentation

## 2017-06-20 DIAGNOSIS — I10 Essential (primary) hypertension: Secondary | ICD-10-CM | POA: Insufficient documentation

## 2017-06-20 DIAGNOSIS — C786 Secondary malignant neoplasm of retroperitoneum and peritoneum: Secondary | ICD-10-CM | POA: Insufficient documentation

## 2017-06-20 LAB — COMPREHENSIVE METABOLIC PANEL
ALBUMIN: 3.2 g/dL — AB (ref 3.5–5.0)
ALK PHOS: 65 U/L (ref 38–126)
ALT: 13 U/L — AB (ref 17–63)
AST: 16 U/L (ref 15–41)
Anion gap: 10 (ref 5–15)
BILIRUBIN TOTAL: 0.5 mg/dL (ref 0.3–1.2)
BUN: 12 mg/dL (ref 6–20)
CO2: 25 mmol/L (ref 22–32)
CREATININE: 1.49 mg/dL — AB (ref 0.61–1.24)
Calcium: 9.2 mg/dL (ref 8.9–10.3)
Chloride: 104 mmol/L (ref 101–111)
GFR calc Af Amer: >60 mL/min (ref >60)
GFR, EST NON AFRICAN AMERICAN: 57 mL/min — AB (ref >60)
GLUCOSE: 117 mg/dL — AB (ref 65–99)
Potassium: 3.4 mmol/L — ABNORMAL LOW (ref 3.5–5.1)
Sodium: 139 mmol/L (ref 135–145)
TOTAL PROTEIN: 7.1 g/dL (ref 6.5–8.1)

## 2017-06-20 LAB — CBC WITH DIFFERENTIAL/PLATELET
BASOS ABS: 0 10*3/uL (ref 0.0–0.1)
Basophils Relative: 0 %
Eosinophils Absolute: 0.1 10*3/uL (ref 0.0–0.7)
Eosinophils Relative: 1 %
HEMATOCRIT: 15.8 % — AB (ref 39.0–52.0)
HEMOGLOBIN: 5 g/dL — AB (ref 13.0–17.0)
LYMPHS PCT: 5 %
Lymphs Abs: 0.7 10*3/uL (ref 0.7–4.0)
MCH: 26.1 pg (ref 26.0–34.0)
MCHC: 31 g/dL (ref 30.0–36.0)
MCV: 84 fL (ref 78.0–100.0)
MONO ABS: 0.9 10*3/uL (ref 0.1–1.0)
Monocytes Relative: 7 %
NEUTROS ABS: 11.2 10*3/uL — AB (ref 1.7–7.7)
Neutrophils Relative %: 87 %
Platelets: 476 10*3/uL — ABNORMAL HIGH (ref 150–400)
RBC: 1.88 MIL/uL — AB (ref 4.22–5.81)
RDW: 17.5 % — ABNORMAL HIGH (ref 11.5–15.5)
WBC: 13 10*3/uL — AB (ref 4.0–10.5)

## 2017-06-20 LAB — PROTIME-INR
INR: 1.13
PROTHROMBIN TIME: 14.4 s (ref 11.4–15.2)

## 2017-06-20 LAB — PREPARE RBC (CROSSMATCH)

## 2017-06-20 LAB — ABO/RH: ABO/RH(D): O POS

## 2017-06-20 MED ORDER — OXYCODONE-ACETAMINOPHEN 5-325 MG PO TABS
2.0000 | ORAL_TABLET | Freq: Once | ORAL | Status: AC
Start: 2017-06-20 — End: 2017-06-20
  Administered 2017-06-20: 2 via ORAL

## 2017-06-20 MED ORDER — MIDAZOLAM HCL 2 MG/2ML IJ SOLN
INTRAMUSCULAR | Status: AC | PRN
  Administered 2017-06-20: 1 mg via INTRAVENOUS
  Administered 2017-06-20: 2 mg via INTRAVENOUS
  Administered 2017-06-20: 1 mg via INTRAVENOUS

## 2017-06-20 MED ORDER — MIDAZOLAM HCL 2 MG/2ML IJ SOLN
INTRAMUSCULAR | Status: AC
  Filled 2017-06-20: qty 2

## 2017-06-20 MED ORDER — DIPHENHYDRAMINE HCL 25 MG PO CAPS
25.0000 mg | ORAL_CAPSULE | Freq: Once | ORAL | Status: AC
  Administered 2017-06-20: 25 mg via ORAL

## 2017-06-20 MED ORDER — FENTANYL CITRATE (PF) 100 MCG/2ML IJ SOLN
INTRAMUSCULAR | Status: AC
  Filled 2017-06-20: qty 2

## 2017-06-20 MED ORDER — SODIUM CHLORIDE 0.9 % IV SOLN
250.0000 mL | Freq: Once | INTRAVENOUS | Status: AC
  Administered 2017-06-20: 250 mL via INTRAVENOUS

## 2017-06-20 MED ORDER — SODIUM CHLORIDE 0.9 % IV SOLN
INTRAVENOUS | Status: DC
  Administered 2017-06-20: 12:00:00 via INTRAVENOUS

## 2017-06-20 MED ORDER — FENTANYL CITRATE (PF) 100 MCG/2ML IJ SOLN
INTRAMUSCULAR | Status: AC | PRN
  Administered 2017-06-20 (×2): 50 ug via INTRAVENOUS

## 2017-06-20 MED ORDER — LIDOCAINE-EPINEPHRINE (PF) 2 %-1:200000 IJ SOLN
INTRAMUSCULAR | Status: AC
  Filled 2017-06-20: qty 20

## 2017-06-20 MED ORDER — LIDOCAINE-EPINEPHRINE (PF) 2 %-1:200000 IJ SOLN
INTRAMUSCULAR | Status: AC | PRN
  Administered 2017-06-20: 10 mL

## 2017-06-20 MED ORDER — OXYCODONE-ACETAMINOPHEN 5-325 MG PO TABS
ORAL_TABLET | ORAL | Status: AC
  Filled 2017-06-20: qty 2

## 2017-06-20 MED ORDER — DIPHENHYDRAMINE HCL 25 MG PO CAPS
ORAL_CAPSULE | ORAL | Status: AC
  Filled 2017-06-20: qty 1

## 2017-06-20 NOTE — Sedation Documentation (Signed)
MD and PA aware of 5.0 hemoglobin.

## 2017-06-20 NOTE — Sedation Documentation (Signed)
200cc of bloody fluid aspirated from left inguinal lymph node

## 2017-06-20 NOTE — Procedures (Signed)
Pre Procedure Dx: Metastatic squamous cell carcinoma of the penis Post Procedural Dx: Same  Technically successful US guided biopsy of left inguinal nodal conglomeration. Technically successful US guided aspiration of a total of 200 cc of bloody fluid from left inguinal nodal conglomeration.   EBL: None  No immediate complications.   Ronny Bacon, MD Pager #: [phone]

## 2017-06-20 NOTE — Progress Notes (Signed)
Pt was brought to [facility]

## 2017-06-20 NOTE — Telephone Encounter (Signed)
Schedule message sent to schedulers for lab appt for type  and cross match and 5 hour blood transfusion appt in infusion room or sickle cell clinic this week.

## 2017-06-20 NOTE — Progress Notes (Signed)
Patient states he has a port however it has not been accessed since November 2018.  RN instructed patient to notify Dr. Hazeline Junker office concerning continued use of port. Patient verbalized understanding.

## 2017-06-20 NOTE — Telephone Encounter (Signed)
Scheduled appt per 3/12 los - spoke with patient regarding appts.

## 2017-06-20 NOTE — Patient Instructions (Signed)
Blood Transfusion A blood transfusion is a procedure in which you are given blood through an IV tube. You may need this procedure because of:  Illness.  Surgery.  Injury.  The blood may come from someone else (a donor). You may also be able to donate blood for yourself (autologous blood donation). The blood given in a transfusion is made up of different types of cells. You may get:  Red blood cells. These carry oxygen to the cells in the body.  White blood cells. These help you fight infections.  Platelets. These help your blood to clot.  Plasma. This is the liquid part of your blood. It helps with fluid imbalances.  If you have a clotting disorder, you may also get other types of blood products. What happens before the procedure?  You will have a blood test to find out your blood type. The test also finds out what type of blood your body will accept and matches it to the donor type.  If you are going to have a planned surgery, you may be able to donate your own blood. This may be done in case you need a transfusion.  If you have had an allergic reaction to a transfusion in the past, you may be given medicine to help prevent a reaction. This medicine may be given to you by mouth or through an IV.  You will have your temperature, blood pressure, and pulse checked.  Follow instructions from your doctor about what you cannot eat or drink.  Ask your doctor about: ? Changing or stopping your regular medicines. This is important if you take diabetes medicines or blood thinners. ? Taking medicines such as aspirin and ibuprofen. These medicines can thin your blood. Do not take these medicines before your procedure if your doctor tells you not to. What happens during the procedure?  An IV tube will be put into one of your veins.  The bag of donated blood will be attached to your IV tube. Then, the blood will enter through your vein.  Your temperature, blood pressure, and pulse will be  checked regularly during the procedure. This is done to find early signs of a transfusion reaction.  If you have any signs or symptoms of a reaction, your transfusion will be stopped. You may also be given medicine.  When the transfusion is done, your IV tube will be taken out.  Pressure may be applied to the IV site for a few minutes.  A bandage (dressing) will be put on the IV site. The procedure may vary among doctors and hospitals. What happens after the procedure?  Your temperature, blood pressure, heart rate, breathing rate, and blood oxygen level will be checked often.  Your blood may be tested to see how you are responding to the transfusion.  You may be warmed with fluids or blankets. This is done to keep the temperature of your body normal. Summary  A blood transfusion is a procedure in which you are given blood through an IV tube.  The blood may come from someone else (a donor). You may also be able to donate blood for yourself.  If you have had an allergic reaction to a transfusion in the past, you may be given medicine to help prevent a reaction. This medicine may be given to you by mouth or through an IV tube.  Your temperature, blood pressure, heart rate, breathing rate, and blood oxygen level will be checked often.  Your blood may be tested to   see how you are responding to the transfusion. This information is not intended to replace advice given to you by your health care provider. Make sure you discuss any questions you have with your health care provider. Document Released: 06/24/2008 Document Revised: 11/20/2015 Document Reviewed: 11/20/2015 Elsevier Interactive Patient Education  2017 [employer] Inc[employer]  

## 2017-06-20 NOTE — H&P (Addendum)
Referring Physician(s): Wyatt Portela  Supervising Physician: Sandi Mariscal  Patient Status:  [facility] OP  Chief Complaint:  "I'm having another biopsy"  Subjective: Patient familiar to IR service from prior left inguinal lymph node biopsy on 11/22/16 which revealed poorly differentiated carcinoma.  He has also undergone prior Port-A-Cath placement on 12/26/16.  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  History also notable for prior left upper extremity DVT as well as bilateral small segmental pulmonary emboli, on xarelto.  He has evidence of progressive disease despite treatment and presents again today for left inguinal lymph node biopsy for genetic markers.  He currently denies high fevers, headaches, chest pain, abdominal/back pain, nausea, vomiting.  He does have some dyspnea with exertion, left arm and leg swelling, some bleeding via penis since on Xarelto and occasional dizziness.  He continues to smoke.  Past Medical History:  Diagnosis Date  . Hypertension   . Inguinal mass 11/17/2016   left  . Sickle cell trait [facility])    Past Surgical History:  Procedure Laterality Date  . IR FLUORO GUIDE PORT INSERTION RIGHT  12/26/2016  . IR US GUIDE VASC ACCESS RIGHT  12/26/2016  . NO PAST SURGERIES       Allergies: Patient has no known allergies.  Medications: Prior to Admission medications   Medication Sig Start Date End Date Taking? Authorizing Provider  amLODipine (NORVASC) 10 MG tablet Take 1 tablet (10 mg total) by mouth daily. 05/30/17   Wyatt Portela, MD  enoxaparin (LOVENOX) 30 MG/0.3ML injection Inject 1 mL (100 mg total) into the skin every 12 (twelve) hours for 9 doses. 05/24/17 05/29/17  Bruning, Ashlyn, PA-C  lidocaine (XYLOCAINE) 2 % jelly Apply 1 application topically as needed. 05/24/17   Bruning, Ashlyn, PA-C  lidocaine-prilocaine (EMLA) cream Apply 1 application topically as needed. Apply to  porta cath before chemotherapy. 12/16/16   Wyatt Portela, MD  oxyCODONE-acetaminophen (PERCOCET/ROXICET) 5-325 MG tablet Take 1 tablet by mouth every 4 (four) hours as needed for severe pain. 06/13/17   Wyatt Portela, MD  prochlorperazine (COMPAZINE) 10 MG tablet Take 1 tablet (10 mg total) by mouth every 6 (six) hours as needed for nausea or vomiting. 12/16/16   Wyatt Portela, MD  Rivaroxaban 15 & 20 MG TBPK Take as directed on package: One 15mg  tablet by mouth twice a day with food for 3 weeks, then one 20mg  tablet once a day with food. 05/29/17   Bruning, Ashlyn, PA-C     Vital Signs: Blood pressure 140/68, temp 99.4, heart rate 131, respirations 18, O2 sat 100% room air    Physical Exam awake, alert.  Chest clear to auscultation bilaterally.  Clean, intact right chest wall Port-A-Cath.  Heart with tachycardic but regular rhythm.  Abdomen soft, positive bowel sounds, nontender.  No right lower extremity edema, significant left lower extremity edema as well as large palpable left inguinal lymph node; left upper extremity edema noted  Imaging: No results found.  Labs:  CBC: Recent Labs    01/17/17 1258 01/30/17 0933 02/20/17 0922 03/13/17 0827 06/06/17 1149  WBC 7.5 11.8* 6.9 9.1 14.0*  HGB 9.0* 9.5* 7.4* 8.2*  --   HCT 28.0* 29.3* 23.3* 24.6* 21.4*  PLT 361 317 390 299 574*    COAGS: Recent Labs    11/21/16 0435 12/26/16 1148  INR 0.98 0.92  APTT 31  --     BMP: Recent Labs  11/17/16 1233 11/18/16 0326 11/19/16 0405 11/28/16 1044  01/30/17 0933 02/20/17 0922 03/13/17 0827 06/06/17 1149  NA 140 142 141 142   < > 140 139 140 140  K 3.5 3.2* 4.0 4.4   < > 3.6 3.9 3.6 3.6  CL 104 106 105 106  --   --   --   --  102  CO2 30 29 30 25    < > 28 26 28 26   GLUCOSE 103* 84 113* 88   < > 113 81 95 114  BUN 11 7 6 13    < > 13.1 16.0 15.6 9  CALCIUM 9.4 8.6* 8.9 9.9   < > 9.2 9.2 10.0 9.9  CREATININE 1.15 1.17 1.13 1.11   < > 1.6* 2.0* 2.0* 1.82*  GFRNONAA >60 >60 >60   --   --   --   --   --  45*  GFRAA >60 >60 >60  --   --   --   --   --  52*   < > = values in this interval not displayed.    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  History also notable for prior left upper extremity DVT as well as bilateral small segmental pulmonary emboli, on xarelto.  Prior left inguinal lymph node biopsy on 11/22/16 which revealed poorly differentiated carcinoma.  He has evidence of progressive disease despite treatment and presents again today for left inguinal lymph node biopsy for genetic markers.  He has had some bleeding from penis since on Xarelto and remains tachycardic with some dyspnea.  Risks and benefits discussed with the patient including, but not limited to bleeding, infection, damage to adjacent structures or low yield requiring additional tests.  All of the patient's questions were answered, patient is agreeable to proceed. Consent signed and in chart.  Currently available labs include WBC 13, hemoglobin 5, platelets 476k.  Above discussed with Drs. Watts/Shadad.  We will plan to proceed with biopsy and patient will receive transfusion at [facility] afterwards.   Electronically Signed: D. Rowe Robert, PA-C 06/20/2017, 11:18 AM   I spent a total of 20 minutes at the the patient's bedside AND on the patient's hospital floor or unit, greater than 50% of which was counseling/coordinating care for image guided left inguinal lymph node biopsy

## 2017-06-22 ENCOUNTER — Inpatient Hospital Stay (HOSPITAL_COMMUNITY)
Admission: EM | Admit: 2017-06-22 | Discharge: 2017-06-28 | DRG: 872 | Disposition: A | Discharge status: Home / Self Care | Payer: Medicaid Other | Attending: Internal Medicine | Admitting: Internal Medicine

## 2017-06-22 ENCOUNTER — Other Ambulatory Visit: Payer: Self-pay | Admitting: Oncology

## 2017-06-22 ENCOUNTER — Encounter (HOSPITAL_COMMUNITY): Payer: Self-pay

## 2017-06-22 ENCOUNTER — Emergency Department (HOSPITAL_COMMUNITY): Payer: Medicaid Other

## 2017-06-22 ENCOUNTER — Inpatient Hospital Stay: Payer: Medicaid Other

## 2017-06-22 ENCOUNTER — Other Ambulatory Visit: Payer: Self-pay

## 2017-06-22 DIAGNOSIS — Z79899 Other long term (current) drug therapy: Secondary | ICD-10-CM

## 2017-06-22 DIAGNOSIS — C774 Secondary and unspecified malignant neoplasm of inguinal and lower limb lymph nodes: Secondary | ICD-10-CM | POA: Diagnosis present

## 2017-06-22 DIAGNOSIS — N390 Urinary tract infection, site not specified: Secondary | ICD-10-CM | POA: Diagnosis present

## 2017-06-22 DIAGNOSIS — C609 Malignant neoplasm of penis, unspecified: Secondary | ICD-10-CM | POA: Diagnosis present

## 2017-06-22 DIAGNOSIS — A419 Sepsis, unspecified organism: Secondary | ICD-10-CM | POA: Diagnosis not present

## 2017-06-22 DIAGNOSIS — D573 Sickle-cell trait: Secondary | ICD-10-CM | POA: Diagnosis present

## 2017-06-22 DIAGNOSIS — Z515 Encounter for palliative care: Secondary | ICD-10-CM

## 2017-06-22 DIAGNOSIS — S31104A Unspecified open wound of abdominal wall, left lower quadrant without penetration into peritoneal cavity, initial encounter: Secondary | ICD-10-CM | POA: Diagnosis present

## 2017-06-22 DIAGNOSIS — N183 Chronic kidney disease, stage 3 (moderate): Secondary | ICD-10-CM | POA: Diagnosis present

## 2017-06-22 DIAGNOSIS — I129 Hypertensive chronic kidney disease with stage 1 through stage 4 chronic kidney disease, or unspecified chronic kidney disease: Secondary | ICD-10-CM | POA: Diagnosis present

## 2017-06-22 DIAGNOSIS — Z86718 Personal history of other venous thrombosis and embolism: Secondary | ICD-10-CM

## 2017-06-22 DIAGNOSIS — F17210 Nicotine dependence, cigarettes, uncomplicated: Secondary | ICD-10-CM | POA: Diagnosis present

## 2017-06-22 DIAGNOSIS — R6510 Systemic inflammatory response syndrome (SIRS) of non-infectious origin without acute organ dysfunction: Secondary | ICD-10-CM | POA: Diagnosis present

## 2017-06-22 DIAGNOSIS — N10 Acute tubulo-interstitial nephritis: Secondary | ICD-10-CM

## 2017-06-22 DIAGNOSIS — N179 Acute kidney failure, unspecified: Secondary | ICD-10-CM | POA: Diagnosis present

## 2017-06-22 DIAGNOSIS — D62 Acute posthemorrhagic anemia: Secondary | ICD-10-CM | POA: Diagnosis present

## 2017-06-22 DIAGNOSIS — T148XXA Other injury of unspecified body region, initial encounter: Secondary | ICD-10-CM | POA: Diagnosis present

## 2017-06-22 DIAGNOSIS — R509 Fever, unspecified: Secondary | ICD-10-CM

## 2017-06-22 DIAGNOSIS — I82409 Acute embolism and thrombosis of unspecified deep veins of unspecified lower extremity: Secondary | ICD-10-CM

## 2017-06-22 DIAGNOSIS — Z923 Personal history of irradiation: Secondary | ICD-10-CM

## 2017-06-22 DIAGNOSIS — Z7901 Long term (current) use of anticoagulants: Secondary | ICD-10-CM

## 2017-06-22 DIAGNOSIS — D638 Anemia in other chronic diseases classified elsewhere: Secondary | ICD-10-CM | POA: Diagnosis present

## 2017-06-22 DIAGNOSIS — I82622 Acute embolism and thrombosis of deep veins of left upper extremity: Secondary | ICD-10-CM | POA: Diagnosis present

## 2017-06-22 DIAGNOSIS — D631 Anemia in chronic kidney disease: Secondary | ICD-10-CM | POA: Diagnosis present

## 2017-06-22 DIAGNOSIS — Z7189 Other specified counseling: Secondary | ICD-10-CM

## 2017-06-22 DIAGNOSIS — T451X5A Adverse effect of antineoplastic and immunosuppressive drugs, initial encounter: Secondary | ICD-10-CM | POA: Diagnosis present

## 2017-06-22 DIAGNOSIS — D649 Anemia, unspecified: Secondary | ICD-10-CM

## 2017-06-22 DIAGNOSIS — D6481 Anemia due to antineoplastic chemotherapy: Secondary | ICD-10-CM

## 2017-06-22 HISTORY — DX: Malignant (primary) neoplasm, unspecified: C80.1

## 2017-06-22 LAB — CBC WITH DIFFERENTIAL/PLATELET
BASOS ABS: 0 10*3/uL (ref 0.0–0.1)
BASOS PCT: 0 %
Eosinophils Absolute: 0.1 10*3/uL (ref 0.0–0.5)
Eosinophils Relative: 0 %
HEMATOCRIT: 19.8 % — AB (ref 38.4–49.9)
HEMOGLOBIN: 6.3 g/dL — AB (ref 13.0–17.1)
Lymphocytes Relative: 4 %
Lymphs Abs: 0.8 10*3/uL — ABNORMAL LOW (ref 0.9–3.3)
MCH: 26.7 pg — ABNORMAL LOW (ref 27.2–33.4)
MCHC: 31.8 g/dL — ABNORMAL LOW (ref 32.0–36.0)
MCV: 83.9 fL (ref 79.3–98.0)
MONO ABS: 1.2 10*3/uL — AB (ref 0.1–0.9)
Monocytes Relative: 6 %
NEUTROS ABS: 18.9 10*3/uL — AB (ref 1.5–6.5)
NEUTROS PCT: 90 %
Platelets: 405 10*3/uL — ABNORMAL HIGH (ref 140–400)
RBC: 2.36 MIL/uL — AB (ref 4.20–5.82)
RDW: 16.7 % — AB (ref 11.0–14.6)
WBC: 20.9 10*3/uL — AB (ref 4.0–10.3)

## 2017-06-22 LAB — COMPREHENSIVE METABOLIC PANEL
ALBUMIN: 2.7 g/dL — AB (ref 3.5–5.0)
ALT: 12 U/L (ref 0–55)
AST: 14 U/L (ref 5–34)
Alkaline Phosphatase: 66 U/L (ref 40–150)
Anion gap: 7 (ref 3–11)
BILIRUBIN TOTAL: 0.3 mg/dL (ref 0.2–1.2)
BUN: 9 mg/dL (ref 7–26)
CO2: 26 mmol/L (ref 22–29)
Calcium: 9 mg/dL (ref 8.4–10.4)
Chloride: 103 mmol/L (ref 98–109)
Creatinine, Ser: 1.44 mg/dL — ABNORMAL HIGH (ref 0.70–1.30)
GFR calc Af Amer: >60 mL/min (ref >60)
GFR calc non Af Amer: 59 mL/min — ABNORMAL LOW (ref >60)
GLUCOSE: 112 mg/dL (ref 70–140)
POTASSIUM: 3.9 mmol/L (ref 3.5–5.1)
Sodium: 136 mmol/L (ref 136–145)
TOTAL PROTEIN: 6.6 g/dL (ref 6.4–8.3)

## 2017-06-22 LAB — TYPE AND SCREEN
ABO/RH(D): O POS
Antibody Screen: NEGATIVE
Unit division: 0
Unit division: 0

## 2017-06-22 LAB — PREPARE RBC (CROSSMATCH)

## 2017-06-22 LAB — URINALYSIS, ROUTINE W REFLEX MICROSCOPIC
BILIRUBIN URINE: NEGATIVE
Glucose, UA: NEGATIVE mg/dL
KETONES UR: NEGATIVE mg/dL
NITRITE: NEGATIVE
PH: 6 (ref 5.0–8.0)
Protein, ur: 30 mg/dL — AB
SPECIFIC GRAVITY, URINE: 1.013 (ref 1.005–1.030)
SQUAMOUS EPITHELIAL / LPF: NONE SEEN

## 2017-06-22 LAB — BPAM RBC
BLOOD PRODUCT EXPIRATION DATE: 201904042359
Blood Product Expiration Date: 201904042359
ISSUE DATE / TIME: 201903121629
Unit Type and Rh: 5100
Unit Type and Rh: 5100

## 2017-06-22 LAB — SAMPLE TO BLOOD BANK

## 2017-06-22 LAB — I-STAT CG4 LACTIC ACID, ED: Lactic Acid, Venous: 0.86 mmol/L (ref 0.5–1.9)

## 2017-06-22 LAB — ABO/RH: ABO/RH(D): O POS

## 2017-06-22 MED ORDER — SODIUM CHLORIDE 0.9 % IV SOLN: Freq: Once | INTRAVENOUS | Status: DC

## 2017-06-22 MED ORDER — ONDANSETRON HCL 4 MG PO TABS: 4.0000 mg | ORAL_TABLET | Freq: Four times a day (QID) | ORAL | Status: DC | PRN

## 2017-06-22 MED ORDER — ACETAMINOPHEN 325 MG PO TABS
650.0000 mg | ORAL_TABLET | Freq: Once | ORAL | Status: AC
  Administered 2017-06-22: 650 mg via ORAL
  Filled 2017-06-22: qty 2

## 2017-06-22 MED ORDER — MORPHINE SULFATE (PF) 2 MG/ML IV SOLN
1.0000 mg | INTRAVENOUS | Status: DC | PRN
  Administered 2017-06-22 – 2017-06-28 (×18): 2 mg via INTRAVENOUS
  Filled 2017-06-22 (×18): qty 1

## 2017-06-22 MED ORDER — ACETAMINOPHEN 325 MG PO TABS
650.0000 mg | ORAL_TABLET | Freq: Four times a day (QID) | ORAL | Status: DC | PRN
  Filled 2017-06-22: qty 2

## 2017-06-22 MED ORDER — ACETAMINOPHEN 325 MG PO TABS
650.0000 mg | ORAL_TABLET | Freq: Four times a day (QID) | ORAL | Status: DC | PRN
  Administered 2017-06-22 – 2017-06-25 (×6): 650 mg via ORAL
  Filled 2017-06-22 (×5): qty 2

## 2017-06-22 MED ORDER — CEFTRIAXONE SODIUM 1 G IJ SOLR
1.0000 g | INTRAMUSCULAR | Status: DC
  Administered 2017-06-23: 1 g via INTRAVENOUS
  Filled 2017-06-22: qty 1

## 2017-06-22 MED ORDER — ACETAMINOPHEN 650 MG RE SUPP: 650.0000 mg | Freq: Four times a day (QID) | RECTAL | Status: DC | PRN

## 2017-06-22 MED ORDER — ONDANSETRON HCL 4 MG/2ML IJ SOLN: 4.0000 mg | Freq: Four times a day (QID) | INTRAMUSCULAR | Status: DC | PRN

## 2017-06-22 MED ORDER — AMLODIPINE BESYLATE 10 MG PO TABS
10.0000 mg | ORAL_TABLET | Freq: Every day | ORAL | Status: DC
  Administered 2017-06-23 – 2017-06-28 (×6): 10 mg via ORAL
  Filled 2017-06-22 (×6): qty 1

## 2017-06-22 MED ORDER — OXYCODONE-ACETAMINOPHEN 5-325 MG PO TABS
1.0000 | ORAL_TABLET | ORAL | Status: DC | PRN
  Administered 2017-06-23 – 2017-06-28 (×12): 1 via ORAL
  Filled 2017-06-22 (×12): qty 1

## 2017-06-22 MED ORDER — SODIUM CHLORIDE 0.9 % IV BOLUS (SEPSIS)
1000.0000 mL | Freq: Once | INTRAVENOUS | Status: AC
  Administered 2017-06-22: 1000 mL via INTRAVENOUS

## 2017-06-22 MED ORDER — SODIUM CHLORIDE 0.9 % IV SOLN
INTRAVENOUS | Status: DC
Start: 2017-06-22 — End: 2017-06-23
  Administered 2017-06-22 – 2017-06-23 (×2): via INTRAVENOUS

## 2017-06-22 MED ORDER — SODIUM CHLORIDE 0.9 % IV SOLN
1.0000 g | Freq: Once | INTRAVENOUS | Status: AC
  Administered 2017-06-22: 1 g via INTRAVENOUS
  Filled 2017-06-22: qty 10

## 2017-06-22 MED ORDER — MORPHINE SULFATE (PF) 4 MG/ML IV SOLN
4.0000 mg | Freq: Once | INTRAVENOUS | Status: AC
  Administered 2017-06-22: 4 mg via INTRAVENOUS
  Filled 2017-06-22: qty 1

## 2017-06-22 NOTE — ED Notes (Signed)
ED TO INPATIENT HANDOFF REPORT  Name/Age/Gender Ronald Wilson 42 y.o. male  Code Status Code Status History    Date Active Date Inactive Code Status Order ID Comments User Context   11/17/2016 22:18 11/23/2016 15:15 Full Code [id number]  Reubin Milan, MD Inpatient      Home/SNF/Other Home  Chief Complaint Tachycardia, Chills, Fever  Level of Care/Admitting Diagnosis ED Disposition    ED Disposition Condition [place]: [facility] [[id number]]  Level of Care: Med-Surg [16]  Diagnosis: SIRS (systemic inflammatory response syndrome) ([place]) [381017]  Admitting Physician: Hosie Poisson [[id number]]  Attending Physician: Hosie Poisson [[id number]]  PT Class (Do Not Modify): Observation [104]  PT Acc Code (Do Not Modify): Observation [10022]       Medical History Past Medical History:  Diagnosis Date  . Hypertension   . Inguinal mass 11/17/2016   left  . Sickle cell trait ([place])     Allergies No Known Allergies  IV Location/Drains/Wounds Patient Lines/Drains/Airways Status   Active Line/Drains/Airways    Name:   Placement date:   Placement time:   Site:   Days:   Implanted Port 12/26/16 Right Chest   12/26/16    1441    Chest   178   Peripheral IV 05/24/17 Anterior;Right Antecubital   05/24/17    1436    Antecubital   29   Incision (Closed) 11/22/16 Groin Left   11/22/16    1026     212          Labs/Imaging Results for orders placed or performed during the hospital encounter of 06/22/17 (from the past 48 hour(s))  Type and screen [place]     Status: None (Preliminary result)   Collection Time: 06/22/17  9:18 AM  Result Value Ref Range   ABO/RH(D) O POS    Antibody Screen NEG    Sample Expiration      06/25/2017 Performed at [facility], [address] [place], [place] [address]    Unit Number [id number]    Blood Component Type RED CELLS,LR    Unit division 00    Status of Unit  ALLOCATED    Transfusion Status OK TO TRANSFUSE    Crossmatch Result Compatible    Unit Number [id number]    Blood Component Type RED CELLS,LR    Unit division 00    Status of Unit ALLOCATED    Transfusion Status OK TO TRANSFUSE    Crossmatch Result Compatible   ABO/Rh     Status: None   Collection Time: 06/22/17  9:18 AM  Result Value Ref Range   ABO/RH(D)      O POS Performed at [facility], [place] [address] [place], [place] [address]   Urinalysis, Routine w reflex microscopic     Status: Abnormal   Collection Time: 06/22/17 12:35 PM  Result Value Ref Range   Color, Urine YELLOW YELLOW   APPearance HAZY (A) CLEAR   Specific Gravity, Urine 1.013 1.005 - 1.030   pH 6.0 5.0 - 8.0   Glucose, UA NEGATIVE NEGATIVE mg/dL   Hgb urine dipstick LARGE (A) NEGATIVE   Bilirubin Urine NEGATIVE NEGATIVE   Ketones, ur NEGATIVE NEGATIVE mg/dL   Protein, ur 30 (A) NEGATIVE mg/dL   Nitrite NEGATIVE NEGATIVE   Leukocytes, UA LARGE (A) NEGATIVE   RBC / HPF TOO NUMEROUS TO COUNT 0 - 5 RBC/hpf   WBC, UA TOO NUMEROUS TO COUNT 0 -  5 WBC/hpf   Bacteria, UA RARE (A) NONE SEEN   Squamous Epithelial / LPF NONE SEEN NONE SEEN   WBC Clumps PRESENT     Comment: Performed at [facility], [place] [address] [place], [place] [address]  I-Stat CG4 Lactic Acid, ED     Status: None   Collection Time: 06/22/17  1:05 PM  Result Value Ref Range   Lactic Acid, Venous 0.86 0.5 - 1.9 mmol/L   Dg Chest 2 View  Result Date: 06/22/2017 CLINICAL DATA:  Fever, just completed chemotherapy, sickle cell trait EXAM: CHEST - 2 VIEW COMPARISON:  CT chest of 05/24/2017 FINDINGS: No active infiltrate or effusion is seen. Right-sided Port-A-Cath tip overlies the lower SVC above the expected right atrial junction. Mediastinal and hilar contours are unremarkable and the heart is within normal limits in size. No bony abnormality is seen. IMPRESSION: No active lung disease. Right-sided  Port-A-Cath tip overlies the lower SVC. Electronically Signed   By: Ivar Drape M.D.   On: 06/22/2017 12:17    Pending Labs Unresulted Labs (From admission, onward)   Start     Ordered   06/22/17 1156  Culture, blood (routine x 2)  BLOOD CULTURE X 2,   STAT     06/22/17 1157   06/22/17 1156  Urine culture  STAT,   STAT     06/22/17 1157      Vitals/Pain Today's Vitals   06/22/17 1139 06/22/17 1415 06/22/17 1416  BP: (!) 145/87  125/75  Pulse: (!) 127  (!) 125  Resp: 18  17  Temp: (!) 102.3 F (39.1 C)  100.3 F (37.9 C)  TempSrc: Oral  Oral  SpO2: 100%  100%  Weight: 204 lb (92.5 kg)    Height: 5\' 9"  (1.753 m)    PainSc: 8  2      Isolation Precautions No active isolations  Medications Medications  morphine 2 MG/ML injection 1-2 mg (not administered)  0.9 %  sodium chloride infusion (not administered)  sodium chloride 0.9 % bolus 1,000 mL (0 mLs Intravenous Stopped 06/22/17 1416)  morphine 4 MG/ML injection 4 mg (4 mg Intravenous Given 06/22/17 1236)  acetaminophen (TYLENOL) tablet 650 mg (650 mg Oral Given 06/22/17 1235)  cefTRIAXone (ROCEPHIN) 1 g in sodium chloride 0.9 % 100 mL IVPB (0 g Intravenous Stopped 06/22/17 1446)    Mobility walks

## 2017-06-22 NOTE — ED Notes (Signed)
Bed: WA23 Expected date:  Expected time:  Means of arrival:  Comments: Cancer center 

## 2017-06-22 NOTE — ED Triage Notes (Signed)
Per Cancer Center: Pt is on Xarelto. Pt had a recent biopsy ad has been peeing blood. Pt has penile cancer.  Pt is on palliative care.  Pt has a left inguinal lymph node biopsy.  Pt's fever was 101.3 HGB 6.2 per cancer center.  Pt was set to get a blood transfusion today and did not get it.

## 2017-06-22 NOTE — Progress Notes (Signed)
Spoke to MD [facility] about pt's abnormal vitals and labs from today.  Pt will be admitted to the ED for further evaluation.  Report given to [facility] RN in the ED who is searching for a room.  Spoke to pt about ED visit who verbalized understanding and agreement.  Gave phone report to [employer] in ED.  Will transport pt via wheelchair to ED room 23.

## 2017-06-22 NOTE — H&P (Signed)
History and Physical    Ronald Wilson QZR:[id number] DOB: 27-Aug-1975 DOA: 06/22/2017  PCP: [name], FNP  Patient coming from: Home.   I have personally briefly reviewed patient's old medical records in [place]  Chief Complaint:   HPI: Ronald Wilson is a 42 y.o. 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 On arrival to ED, he was found to be febrile, tachycardic, with normal blood pressure parameters. Further lab work revealed creatinine of 1.44, wbc count of 20.9 and platelet count of 405. His lactic acid was 0.86. UA shows large leukocytes and wbc . He was referred to [facility] for admission for possible SIRS.  He reports pain in the groin area due to adenopathy and pain int he penile area. He denies nausea, vomiting, abdominal pain, sob, chest pain, cough, . CXR does not show any pneumonia.     Review of Systems: As per HPI otherwise 10 point review of systems negative.    Past Medical History:  Diagnosis Date  . Hypertension   . Inguinal mass 11/17/2016   left  . Sickle cell trait [facility])     Past Surgical History:  Procedure Laterality Date  . IR FLUORO GUIDE PORT INSERTION RIGHT  12/26/2016  . IR US GUIDE VASC ACCESS RIGHT  12/26/2016  . NO PAST SURGERIES       reports that he has been smoking cigarettes.  He has a 6.25 pack-year smoking history. he has never used smokeless tobacco. He reports that he drinks about 9.0 oz of alcohol per week. He reports that he uses drugs. Drug: Marijuana.  No Known Allergies  Family History  Problem Relation Age of Onset  . Hypertension Mother   . Cancer Maternal Aunt        unknown   Family history reviewed.   Prior to Admission  medications   Medication Sig Start Date End Date Taking? Authorizing Provider  amLODipine (NORVASC) 10 MG tablet Take 1 tablet (10 mg total) by mouth daily. 05/30/17  Yes Wyatt Portela, MD  lidocaine (XYLOCAINE) 2 % jelly Apply 1 application topically as needed. 05/24/17  Yes Bruning, Ashlyn, PA-C  lidocaine-prilocaine (EMLA) cream Apply 1 application topically as needed. Apply to porta cath before chemotherapy. 12/16/16  Yes Wyatt Portela, MD  oxyCODONE-acetaminophen (PERCOCET/ROXICET) 5-325 MG tablet Take 1 tablet by mouth every 4 (four) hours as needed for severe pain. 06/13/17  Yes Wyatt Portela, MD  prochlorperazine (COMPAZINE) 10 MG tablet Take 1 tablet (10 mg total) by mouth every 6 (six) hours as needed for nausea or vomiting. 12/16/16  Yes Wyatt Portela, MD  Rivaroxaban 15 & 20 MG TBPK Take as directed on package: One 15mg  tablet by mouth twice a day with food for 3 weeks, then one 20mg  tablet once a day with food. 05/29/17  Yes Bruning, Ashlyn, PA-C  enoxaparin (LOVENOX) 30 MG/0.3ML injection Inject 1 mL (100 mg total) into the skin every 12 (twelve) hours for 9 doses. 05/24/17 05/29/17  Freeman Caldron, PA-C    Physical Exam: Vitals:   06/22/17 1139 06/22/17 1416  BP: (!) 145/87 125/75  Pulse: (!) 127 (!) 125  Resp: 18 17  Temp: (!) 102.3 F (39.1 C)  100.3 F (37.9 C)  TempSrc: Oral Oral  SpO2: 100% 100%  Weight: 92.5 kg (204 lb)   Height: 5\' 9"  (1.753 m)     Constitutional: NAD, calm, comfortable Vitals:   06/22/17 1139 06/22/17 1416  BP: (!) 145/87 125/75  Pulse: (!) 127 (!) 125  Resp: 18 17  Temp: (!) 102.3 F (39.1 C) 100.3 F (37.9 C)  TempSrc: Oral Oral  SpO2: 100% 100%  Weight: 92.5 kg (204 lb)   Height: 5\' 9"  (1.753 m)    Eyes: PERRL, lids and conjunctivae normal ENMT: Mucous membranes are moist. Posterior pharynx clear of any exudate or lesions.Normal dentition.  Neck: normal, supple, no masses, no thyromegaly Respiratory: clear to auscultation bilaterally,  no wheezing, no crackles. Normal respiratory effort. No accessory muscle use.  Cardiovascular: tachycardia, no murmer heard.  Abdomen: no tenderness, no masses palpated. No hepatosplenomegaly. Bowel sounds positive. adenopathy in the groin area and penile enlargement.  Musculoskeletal: lft lower extremity 3+ edema compared to right lower extremity which does not have edema.   Skin: no rashes, lesions, ulcers. No induration Neurologic: CN 2-12 grossly intact. Sensation intact, DTR normal. Strength 5/5 in all 4.  Psychiatric: Normal judgment and insight. Alert and oriented x 3. Normal mood.     Labs on Admission: I have personally reviewed following labs and imaging studies  CBC: Recent Labs  Lab 06/20/17 1120 06/22/17 0911  WBC 13.0* 20.9*  NEUTROABS 11.2* 18.9*  HGB 5.0* 6.3*  HCT 15.8* 19.8*  MCV 84.0 83.9  PLT 476* 462*   Basic Metabolic Panel: Recent Labs  Lab 06/20/17 1120 06/22/17 0911  NA 139 136  K 3.4* 3.9  CL 104 103  CO2 25 26  GLUCOSE 117* 112  BUN 12 9  CREATININE 1.49* 1.44*  CALCIUM 9.2 9.0   GFR: Estimated Creatinine Clearance: 75.8 mL/min (A) (by C-G formula based on SCr of 1.44 mg/dL (H)). Liver Function Tests: Recent Labs  Lab 06/20/17 1120 06/22/17 0911  AST 16 14  ALT 13* 12  ALKPHOS 65 66  BILITOT 0.5 0.3  PROT 7.1 6.6  ALBUMIN 3.2* 2.7*   No results for input(s): LIPASE, AMYLASE in the last 168 hours. No results for input(s): AMMONIA in the last 168 hours. Coagulation Profile: Recent Labs  Lab 06/20/17 1120  INR 1.13   Cardiac Enzymes: No results for input(s): CKTOTAL, CKMB, CKMBINDEX, TROPONINI in the last 168 hours. BNP (last 3 results) No results for input(s): PROBNP in the last 8760 hours. HbA1C: No results for input(s): HGBA1C in the last 72 hours. CBG: No results for input(s): GLUCAP in the last 168 hours. Lipid Profile: No results for input(s): CHOL, HDL, LDLCALC, TRIG, CHOLHDL, LDLDIRECT in the last 72 hours. Thyroid  Function Tests: No results for input(s): TSH, T4TOTAL, FREET4, T3FREE, THYROIDAB in the last 72 hours. Anemia Panel: No results for input(s): VITAMINB12, FOLATE, FERRITIN, TIBC, IRON, RETICCTPCT in the last 72 hours. Urine analysis:    Component Value Date/Time   COLORURINE YELLOW 06/22/2017 1235   APPEARANCEUR HAZY (A) 06/22/2017 1235   LABSPEC 1.013 06/22/2017 1235   PHURINE 6.0 06/22/2017 1235   GLUCOSEU NEGATIVE 06/22/2017 1235   HGBUR LARGE (A) 06/22/2017 1235   BILIRUBINUR NEGATIVE 06/22/2017 1235   KETONESUR NEGATIVE 06/22/2017 1235   PROTEINUR 30 (A) 06/22/2017 1235   NITRITE NEGATIVE 06/22/2017 1235   LEUKOCYTESUR LARGE (A) 06/22/2017 1235    Radiological Exams on Admission: Dg Chest 2 View  Result Date: 06/22/2017 CLINICAL DATA:  Fever, just  completed chemotherapy, sickle cell trait EXAM: CHEST - 2 VIEW COMPARISON:  CT chest of 05/24/2017 FINDINGS: No active infiltrate or effusion is seen. Right-sided Port-A-Cath tip overlies the lower SVC above the expected right atrial junction. Mediastinal and hilar contours are unremarkable and the heart is within normal limits in size. No bony abnormality is seen. IMPRESSION: No active lung disease. Right-sided Port-A-Cath tip overlies the lower SVC. Electronically Signed   By: Ivar Drape M.D.   On: 06/22/2017 12:17      Assessment/Plan Active Problems:   SIRS (systemic inflammatory response syndrome) ([facility])    Fever, tachycardia, leukocytosis, with abnormal UA.  Possibly SIRS from UTI: ADMIT for IV ROCEPHIN, blood cultures and urine cultures done and pending.  Hydrate .    Metastatic squamous cell ca of the penis:  Further management as per Dr Alen Blew.   Stage 1 CKD:  Creatinine stable around 1.4.    Anemia:  secondary to bleeding from penile area. Transfuse to keep hemoglobin greater than 7.  2 units of prbc transfusion ordered.  Repeat H&H in am.    Hypertension; well controlled.    Sickle cell trait:  Stable.     Left upper extremity DVT:  Holding the xarelto , till the hemoglobin improves.  xarelto can be restarted once bleeding from the penile are stops.       DVT prophylaxis:scd;s Code Status: full code.  Family Communication: none at bedside.  Disposition Plan: pending prbc transfusion.  Consults called: none.  Admission status: inpatient/    Hosie Poisson MD [facility] Pager [phone]  If 7PM-7AM, please contact night-coverage [email or web address] Password [facility]  06/22/2017, 5:23 PM

## 2017-06-22 NOTE — ED Provider Notes (Signed)
[facility] DEPT Provider Note   CSN: [id number] Arrival date & time: 06/22/17  1126     History   Chief Complaint Chief Complaint  Patient presents with  . Tachycardia  . Chills  . Fever    HPI Ronald Wilson is a 42 y.o. male.  He was sent her down from the cancer center for fever.  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  He had a recent inguinal lymph node biopsy a few days ago.  He was seen in the clinic yesterday and was to have a blood transfusion today.  There they found him with a temp of 102.3.  Patient denies any obvious infectious sources, no cough no nausea no vomiting no dysuria.  He has had chronic left lower extremity edema.  He is on blood thinners for DVT/PE.  The history is provided by the patient.  Fever   This is a new problem. The current episode started 12 to 24 hours ago. The maximum temperature noted was 102 to 102.9 F. Pertinent negatives include no chest pain, no diarrhea, no vomiting, no congestion, no headaches, no sore throat and no cough. He has tried nothing for the symptoms. The treatment provided no relief.    Past Medical History:  Diagnosis Date  . Hypertension   . Inguinal mass 11/17/2016   left  . Sickle cell trait [facility])     Patient Active Problem List   Diagnosis Date Noted  . Goals of care, counseling/discussion 12/16/2016  . Penis cancer ([place]) 12/16/2016  . Inguinal lymphadenopathy   . Inguinal mass 11/17/2016  . 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 Anemia 11/17/2016  . Unilateral edema of left lower extremity 11/17/2016  . Tobacco use disorder 11/17/2016    Past Surgical History:  Procedure Laterality Date  . IR FLUORO GUIDE PORT INSERTION RIGHT  12/26/2016  . IR US GUIDE VASC ACCESS RIGHT  12/26/2016  . NO PAST SURGERIES         Home Medications    Prior to Admission medications   Medication Sig Start Date End Date Taking? Authorizing Provider    amLODipine (NORVASC) 10 MG tablet Take 1 tablet (10 mg total) by mouth daily. 05/30/17  Yes Wyatt Portela, MD  lidocaine (XYLOCAINE) 2 % jelly Apply 1 application topically as needed. 05/24/17  Yes Bruning, Ashlyn, PA-C  lidocaine-prilocaine (EMLA) cream Apply 1 application topically as needed. Apply to porta cath before chemotherapy. 12/16/16  Yes Wyatt Portela, MD  oxyCODONE-acetaminophen (PERCOCET/ROXICET) 5-325 MG tablet Take 1 tablet by mouth every 4 (four) hours as needed for severe pain. 06/13/17  Yes Wyatt Portela, MD  prochlorperazine (COMPAZINE) 10 MG tablet Take 1 tablet (10 mg total) by mouth every 6 (six) hours as needed for nausea or vomiting. 12/16/16  Yes Wyatt Portela, MD  Rivaroxaban 15 & 20 MG TBPK Take as directed on package: One 15mg  tablet by mouth twice a day with food for 3 weeks, then one 20mg  tablet once a day with food. 05/29/17  Yes Bruning, Ashlyn, PA-C  enoxaparin (LOVENOX) 30 MG/0.3ML injection Inject 1 mL (100 mg total) into the skin every 12 (twelve) hours for 9 doses. 05/24/17 05/29/17  Freeman Caldron, PA-C    Family History Family History  Problem Relation Age of Onset  . Hypertension Mother   . Cancer Maternal Aunt        unknown    Social History Social History   Tobacco Use  .  Smoking status: Current Every Day Smoker    Packs/day: 0.25    Years: 25.00    Pack years: 6.25    Types: Cigarettes  . Smokeless tobacco: Never Used  Substance Use Topics  . Alcohol use: Yes    Alcohol/week: 9.0 oz    Types: 15 Shots of liquor per week    Comment: "11/17/2016 "I'll have shots ~ 3 days/wk"  . Drug use: Yes    Types: Marijuana    Comment: 11/17/2016 "daily"     Allergies   Patient has no known allergies.   Review of Systems Review of Systems  Constitutional: Positive for fever. Negative for chills.  HENT: Negative for congestion, ear pain and sore throat.   Eyes: Negative for pain and visual disturbance.  Respiratory: Negative for cough and  shortness of breath.   Cardiovascular: Negative for chest pain and palpitations.  Gastrointestinal: Negative for abdominal pain, diarrhea and vomiting.  Genitourinary: Positive for genital sores, hematuria, penile pain and penile swelling. Negative for dysuria.  Musculoskeletal: Negative for arthralgias, back pain and neck pain.  Skin: Negative for color change and rash.  Neurological: Negative for seizures, syncope and headaches.  All other systems reviewed and are negative.    Physical Exam Updated Vital Signs BP (!) 145/87 (BP Location: Right Arm)   Pulse (!) 127   Temp (!) 102.3 F (39.1 C) (Oral)   Resp 18   Ht 5\' 9"  (1.753 m)   Wt 92.5 kg (204 lb)   SpO2 100%   BMI 30.13 kg/m   Physical Exam  Constitutional: He appears well-developed and well-nourished. No distress.  HENT:  Head: Normocephalic and atraumatic.  Mouth/Throat: No oropharyngeal exudate.  Eyes: Conjunctivae are normal. Right eye exhibits no discharge. Left eye exhibits no discharge.  Neck: Neck supple.  Cardiovascular: Regular rhythm. Tachycardia present.  No murmur heard. Pulmonary/Chest: Effort normal and breath sounds normal. No respiratory distress.  Abdominal: Soft. There is no tenderness.  Musculoskeletal: Normal range of motion. He exhibits edema (Left lower extremity).  Neurological: He is alert.  Skin: Skin is warm and dry. Capillary refill takes less than 2 seconds.  Psychiatric: He has a normal mood and affect.  Nursing note and vitals reviewed.    ED Treatments / Results  Labs (all labs ordered are listed, but only abnormal results are displayed) Labs Reviewed  URINE CULTURE - Abnormal; Notable for the following components:      Result Value   Culture MULTIPLE SPECIES PRESENT, SUGGEST RECOLLECTION (*)    All other components within normal limits  URINALYSIS, ROUTINE W REFLEX MICROSCOPIC - Abnormal; Notable for the following components:   APPearance HAZY (*)    Hgb urine dipstick LARGE  (*)    Protein, ur 30 (*)    Leukocytes, UA LARGE (*)    Bacteria, UA RARE (*)    All other components within normal limits  CBC - Abnormal; Notable for the following components:   WBC 20.6 (*)    RBC 2.69 (*)    Hemoglobin 7.6 (*)    HCT 22.4 (*)    All other components within normal limits  BASIC METABOLIC PANEL - Abnormal; Notable for the following components:   Glucose, Bld 110 (*)    Calcium 8.5 (*)    All other components within normal limits  URINALYSIS, ROUTINE W REFLEX MICROSCOPIC - Abnormal; Notable for the following components:   Hgb urine dipstick SMALL (*)    Leukocytes, UA SMALL (*)    All  other components within normal limits  IRON AND TIBC - Abnormal; Notable for the following components:   Iron <5 (*)    TIBC 190 (*)    All other components within normal limits  RETICULOCYTES - Abnormal; Notable for the following components:   Retic Ct Pct 3.5 (*)    RBC. 2.67 (*)    All other components within normal limits  CBC - Abnormal; Notable for the following components:   WBC 29.0 (*)    RBC 1.22 (*)    Hemoglobin 3.5 (*)    HCT 10.3 (*)    RDW 15.8 (*)    Platelets 464 (*)    All other components within normal limits  BASIC METABOLIC PANEL - Abnormal; Notable for the following components:   Glucose, Bld 110 (*)    Calcium 8.3 (*)    All other components within normal limits  CBC WITH DIFFERENTIAL/PLATELET - Abnormal; Notable for the following components:   WBC 21.6 (*)    RBC 2.68 (*)    Hemoglobin 7.5 (*)    HCT 22.5 (*)    Neutro Abs 19.4 (*)    Monocytes Absolute 1.1 (*)    All other components within normal limits  CULTURE, BLOOD (ROUTINE X 2)  CULTURE, BLOOD (ROUTINE X 2)  URINE CULTURE  FERRITIN  VITAMIN B12  FOLATE  CBC  BASIC METABOLIC PANEL  I-STAT CG4 LACTIC ACID, ED  TYPE AND SCREEN  ABO/RH  PREPARE RBC (CROSSMATCH)    EKG  EKG Interpretation None       Radiology [place] Core Biopsy (lymph Nodes)  Result Date:  06/20/2017 INDICATION: History of 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 EXAM: ULTRASOUND-GUIDED BIOPSY AND ASPIRATION OF THE CHRONIC LEFT INGUINAL NODAL CONGLOMERATION COMPARISON:  CT abdomen and pelvis - 06/06/2017 MEDICATIONS: None ANESTHESIA/SEDATION: Moderate (conscious) sedation was employed during this procedure. A total of Versed 4 mg and Fentanyl 100 mcg was administered intravenously. Moderate Sedation Time: 27 minutes. The patient's level of consciousness and vital signs were monitored continuously by radiology nursing throughout the procedure under my direct supervision. COMPLICATIONS: None immediate. TECHNIQUE: Informed written consent was obtained from the patient after a discussion of the risks, benefits and alternatives to treatment. Questions regarding the procedure were encouraged and answered. Initial ultrasound scanning demonstrated multiple probably cystic lymph nodes within the left groin with dominant probably cystic lymph node measuring approximately 10.7 x 7.2 cm (image 4). An ultrasound image was saved for documentation purposes. The procedure was planned. A timeout was performed prior to the initiation of the procedure. The operative was prepped and draped in the usual sterile fashion, and a sterile drape was applied covering the operative field. A timeout was performed prior to the initiation of the procedure. Local anesthesia was provided with 1% lidocaine with epinephrine. Under direct ultrasound guidance, an 18 gauge core needle device was advanced into the peripheral aspect of the cystic lymph node. Immediately following trocar needle placement, dark red/brown reflux from the inner stylet. As such, the trocar needle was connected to a evacuation bag in approximately 150 cc of additional dark red/brown fluid was aspirated. Next, the 18 gauge trocar  needle was advanced into 2 adjacent more solid appearing lymph nodes within the right groin, likely correlating with the adjacent lymph node seen preceding abdominal CT image 93, series 2. Approximately 15 cc of slightly caseating fluid was aspirated from both of these  adjacent lymph nodes. All aspirated fluid was capped and sent to the laboratory for surgical pathologic analysis. Following evacuation of the majority of the dominant cystic component of the more superficial lymph node, a complex fluid collection with the deeper aspect of the medial thigh likely correlating with the collection seen on image 84, series 2) was targeted for biopsy (ultrasound image 20) again, an 18 gauge trocar needle was advanced into this cystic lymph node and an additional 50 cc of dark red/brown fluid was aspirated. All aspirated fluid was capped and sent to the laboratory for cytologic analysis. Postprocedural imaging was and the procedure was terminated. Post procedure scan was negative for significant hematoma. A dressing was placed. The patient tolerated the procedure well without immediate postprocedural complication. IMPRESSION: Technically successful ultrasound guided aspiration and biopsy of several adjacent predominantly cystic lymph nodes within the left groin. Note, approximately 200 cc of dark red/brown fluid was aspirated and sent to the laboratory for cytologic analysis. Electronically Signed   By: Sandi Mariscal M.D.   On: 06/20/2017 17:04    Procedures Procedures (including critical care time)  Medications Ordered in ED Medications  sodium chloride 0.9 % bolus 1,000 mL (not administered)  morphine 4 MG/ML injection 4 mg (not administered)  acetaminophen (TYLENOL) tablet 650 mg (not administered)     Initial Impression / Assessment and Plan / ED Course  I have reviewed the triage vital signs and the nursing notes.  Pertinent labs & imaging results that were available during my care of the patient were  reviewed by me and considered in my medical decision making (see chart for details).  Clinical Course as of Jun 24 1898  Thu Jun 22, 2017  1408 Discussed with oncology Dr. Alen Blew who agrees with current workup and would support him being admitted for covering the infection and getting transfused.  [MB]  1601 Patient given 1 dose of IV ceftriaxone for presumptive UTI.  I discussed with Dr. Karleen Hampshire from the hospitalist service who will admit to her service.  [MB]    Clinical Course User Index [MB] Hayden Rasmussen, MD     Final Clinical Impressions(s) / ED Diagnoses   Final diagnoses:  SIRS (systemic inflammatory response syndrome) ([place])  Acute pyelonephritis    ED Discharge Orders    None       Hayden Rasmussen, MD 06/24/17 1901

## 2017-06-23 ENCOUNTER — Observation Stay (HOSPITAL_COMMUNITY): Payer: Medicaid Other

## 2017-06-23 ENCOUNTER — Encounter (HOSPITAL_COMMUNITY): Payer: Self-pay | Admitting: Radiology

## 2017-06-23 ENCOUNTER — Other Ambulatory Visit: Payer: Self-pay

## 2017-06-23 DIAGNOSIS — I82622 Acute embolism and thrombosis of deep veins of left upper extremity: Secondary | ICD-10-CM | POA: Diagnosis present

## 2017-06-23 DIAGNOSIS — T451X5A Adverse effect of antineoplastic and immunosuppressive drugs, initial encounter: Secondary | ICD-10-CM | POA: Diagnosis present

## 2017-06-23 DIAGNOSIS — D638 Anemia in other chronic diseases classified elsewhere: Secondary | ICD-10-CM | POA: Diagnosis present

## 2017-06-23 DIAGNOSIS — D630 Anemia in neoplastic disease: Secondary | ICD-10-CM

## 2017-06-23 DIAGNOSIS — A419 Sepsis, unspecified organism: Secondary | ICD-10-CM

## 2017-06-23 DIAGNOSIS — I129 Hypertensive chronic kidney disease with stage 1 through stage 4 chronic kidney disease, or unspecified chronic kidney disease: Secondary | ICD-10-CM | POA: Diagnosis present

## 2017-06-23 DIAGNOSIS — R6510 Systemic inflammatory response syndrome (SIRS) of non-infectious origin without acute organ dysfunction: Secondary | ICD-10-CM | POA: Diagnosis not present

## 2017-06-23 DIAGNOSIS — Z515 Encounter for palliative care: Secondary | ICD-10-CM | POA: Diagnosis not present

## 2017-06-23 DIAGNOSIS — Z923 Personal history of irradiation: Secondary | ICD-10-CM | POA: Diagnosis not present

## 2017-06-23 DIAGNOSIS — Z7901 Long term (current) use of anticoagulants: Secondary | ICD-10-CM | POA: Diagnosis not present

## 2017-06-23 DIAGNOSIS — S31104A Unspecified open wound of abdominal wall, left lower quadrant without penetration into peritoneal cavity, initial encounter: Secondary | ICD-10-CM | POA: Diagnosis present

## 2017-06-23 DIAGNOSIS — D62 Acute posthemorrhagic anemia: Secondary | ICD-10-CM | POA: Diagnosis present

## 2017-06-23 DIAGNOSIS — F17210 Nicotine dependence, cigarettes, uncomplicated: Secondary | ICD-10-CM | POA: Diagnosis present

## 2017-06-23 DIAGNOSIS — D649 Anemia, unspecified: Secondary | ICD-10-CM | POA: Diagnosis not present

## 2017-06-23 DIAGNOSIS — N390 Urinary tract infection, site not specified: Secondary | ICD-10-CM | POA: Diagnosis present

## 2017-06-23 DIAGNOSIS — I82409 Acute embolism and thrombosis of unspecified deep veins of unspecified lower extremity: Secondary | ICD-10-CM

## 2017-06-23 DIAGNOSIS — G893 Neoplasm related pain (acute) (chronic): Secondary | ICD-10-CM | POA: Diagnosis not present

## 2017-06-23 DIAGNOSIS — N179 Acute kidney failure, unspecified: Secondary | ICD-10-CM | POA: Diagnosis present

## 2017-06-23 DIAGNOSIS — C774 Secondary and unspecified malignant neoplasm of inguinal and lower limb lymph nodes: Secondary | ICD-10-CM | POA: Diagnosis present

## 2017-06-23 DIAGNOSIS — T148XXA Other injury of unspecified body region, initial encounter: Secondary | ICD-10-CM | POA: Diagnosis not present

## 2017-06-23 DIAGNOSIS — D573 Sickle-cell trait: Secondary | ICD-10-CM | POA: Diagnosis present

## 2017-06-23 DIAGNOSIS — N10 Acute tubulo-interstitial nephritis: Secondary | ICD-10-CM | POA: Diagnosis not present

## 2017-06-23 DIAGNOSIS — C609 Malignant neoplasm of penis, unspecified: Secondary | ICD-10-CM

## 2017-06-23 DIAGNOSIS — R5081 Fever presenting with conditions classified elsewhere: Secondary | ICD-10-CM | POA: Diagnosis not present

## 2017-06-23 DIAGNOSIS — Z79899 Other long term (current) drug therapy: Secondary | ICD-10-CM | POA: Diagnosis not present

## 2017-06-23 DIAGNOSIS — I10 Essential (primary) hypertension: Secondary | ICD-10-CM | POA: Diagnosis not present

## 2017-06-23 DIAGNOSIS — N183 Chronic kidney disease, stage 3 (moderate): Secondary | ICD-10-CM | POA: Diagnosis present

## 2017-06-23 DIAGNOSIS — D6481 Anemia due to antineoplastic chemotherapy: Secondary | ICD-10-CM

## 2017-06-23 DIAGNOSIS — D631 Anemia in chronic kidney disease: Secondary | ICD-10-CM | POA: Diagnosis present

## 2017-06-23 DIAGNOSIS — Z86718 Personal history of other venous thrombosis and embolism: Secondary | ICD-10-CM | POA: Diagnosis not present

## 2017-06-23 LAB — BASIC METABOLIC PANEL
Anion gap: 7 (ref 5–15)
BUN: 8 mg/dL (ref 6–20)
CHLORIDE: 105 mmol/L (ref 101–111)
CO2: 26 mmol/L (ref 22–32)
Calcium: 8.5 mg/dL — ABNORMAL LOW (ref 8.9–10.3)
Creatinine, Ser: 1.24 mg/dL (ref 0.61–1.24)
Glucose, Bld: 110 mg/dL — ABNORMAL HIGH (ref 65–99)
POTASSIUM: 3.9 mmol/L (ref 3.5–5.1)
SODIUM: 138 mmol/L (ref 135–145)

## 2017-06-23 LAB — CBC
HCT: 22.4 % — ABNORMAL LOW (ref 39.0–52.0)
HEMOGLOBIN: 7.6 g/dL — AB (ref 13.0–17.0)
MCH: 28.3 pg (ref 26.0–34.0)
MCHC: 33.9 g/dL (ref 30.0–36.0)
MCV: 83.3 fL (ref 78.0–100.0)
PLATELETS: 354 10*3/uL (ref 150–400)
RBC: 2.69 MIL/uL — AB (ref 4.22–5.81)
RDW: 15.3 % (ref 11.5–15.5)
WBC: 20.6 10*3/uL — AB (ref 4.0–10.5)

## 2017-06-23 LAB — IRON AND TIBC: TIBC: 190 ug/dL — AB (ref 250–450)

## 2017-06-23 LAB — RETICULOCYTES
RBC.: 2.67 MIL/uL — ABNORMAL LOW (ref 4.22–5.81)
RETIC COUNT ABSOLUTE: 93.5 10*3/uL (ref 19.0–186.0)
Retic Ct Pct: 3.5 % — ABNORMAL HIGH (ref 0.4–3.1)

## 2017-06-23 LAB — URINE CULTURE

## 2017-06-23 LAB — FERRITIN: FERRITIN: 186 ng/mL (ref 24–336)

## 2017-06-23 MED ORDER — IOPAMIDOL (ISOVUE-300) INJECTION 61%
INTRAVENOUS | Status: AC
  Filled 2017-06-23: qty 75

## 2017-06-23 MED ORDER — KETOROLAC TROMETHAMINE 30 MG/ML IJ SOLN
30.0000 mg | Freq: Once | INTRAMUSCULAR | Status: AC
  Administered 2017-06-23: 30 mg via INTRAVENOUS
  Filled 2017-06-23: qty 1

## 2017-06-23 MED ORDER — IOPAMIDOL (ISOVUE-300) INJECTION 61%
50.0000 mL | Freq: Once | INTRAVENOUS | Status: AC | PRN
  Administered 2017-06-23: 75 mL via INTRAVENOUS

## 2017-06-23 NOTE — Progress Notes (Signed)
Referring Physician(s): Rizwan,S  Supervising Physician: Arne Cleveland  Patient Status:  [facility] - In-pt  Chief Complaint:  Left inguinal pain, squamous cell cancer penis  Subjective: Patient familiar to IR service from prior Port-A-Cath on 12/26/16 and biopsy/aspiration of chronic left inguinal nodal conglomeration on 06/20/17.  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  Patient now complaining of some increased swelling in left inguinal region.   Allergies: Patient has no known allergies.  Medications: Prior to Admission medications   Medication Sig Start Date End Date Taking? Authorizing Provider  amLODipine (NORVASC) 10 MG tablet Take 1 tablet (10 mg total) by mouth daily. 05/30/17  Yes Wyatt Portela, MD  lidocaine (XYLOCAINE) 2 % jelly Apply 1 application topically as needed. 05/24/17  Yes Bruning, Ashlyn, PA-C  lidocaine-prilocaine (EMLA) cream Apply 1 application topically as needed. Apply to porta cath before chemotherapy. 12/16/16  Yes Wyatt Portela, MD  oxyCODONE-acetaminophen (PERCOCET/ROXICET) 5-325 MG tablet Take 1 tablet by mouth every 4 (four) hours as needed for severe pain. 06/13/17  Yes Wyatt Portela, MD  prochlorperazine (COMPAZINE) 10 MG tablet Take 1 tablet (10 mg total) by mouth every 6 (six) hours as needed for nausea or vomiting. 12/16/16  Yes Wyatt Portela, MD  Rivaroxaban 15 & 20 MG TBPK Take as directed on package: One 15mg  tablet by mouth twice a day with food for 3 weeks, then one 20mg  tablet once a day with food. 05/29/17  Yes Bruning, Ashlyn, PA-C  enoxaparin (LOVENOX) 30 MG/0.3ML injection Inject 1 mL (100 mg total) into the skin every 12 (twelve) hours for 9 doses. 05/24/17 05/29/17  Bruning, Ashlyn, PA-C     Vital Signs: BP (!) 146/80 Comment: Pt on amlodipine; on am MAR  Pulse (!) 108   Temp 100.2 F (37.9 C) (Oral)   Resp 16   Ht 5\' 9"  (1.753 m)    Wt 204 lb (92.5 kg)   SpO2 100%   BMI 30.13 kg/m   Physical Exam awake, alert; significant left inguinal /mid pelvic firmness and edema along with left lower extremity edema.  Area tender to palpation  Imaging: Dg Chest 2 View  Result Date: 06/22/2017 CLINICAL DATA:  Fever, just completed chemotherapy, sickle cell trait EXAM: CHEST - 2 VIEW COMPARISON:  CT chest of 05/24/2017 FINDINGS: No active infiltrate or effusion is seen. Right-sided Port-A-Cath tip overlies the lower SVC above the expected right atrial junction. Mediastinal and hilar contours are unremarkable and the heart is within normal limits in size. No bony abnormality is seen. IMPRESSION: No active lung disease. Right-sided Port-A-Cath tip overlies the lower SVC. Electronically Signed   By: Ivar Drape M.D.   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 EXAM: ULTRASOUND-GUIDED BIOPSY AND ASPIRATION OF THE CHRONIC LEFT INGUINAL NODAL CONGLOMERATION COMPARISON:  CT abdomen and pelvis - 06/06/2017 MEDICATIONS: None ANESTHESIA/SEDATION: Moderate (conscious) sedation was employed during this procedure. A total of Versed 4 mg and Fentanyl 100 mcg was administered intravenously. Moderate Sedation Time: 27 minutes. The patient's level of consciousness and vital signs were monitored continuously by radiology nursing throughout the procedure under my direct supervision. COMPLICATIONS: None immediate. TECHNIQUE: Informed written consent was obtained from the patient after a  discussion of the risks, benefits and alternatives to treatment. Questions regarding the procedure were encouraged and answered. Initial ultrasound scanning demonstrated multiple probably cystic lymph nodes within the left groin with dominant probably  cystic lymph node measuring approximately 10.7 x 7.2 cm (image 4). An ultrasound image was saved for documentation purposes. The procedure was planned. A timeout was performed prior to the initiation of the procedure. The operative was prepped and draped in the usual sterile fashion, and a sterile drape was applied covering the operative field. A timeout was performed prior to the initiation of the procedure. Local anesthesia was provided with 1% lidocaine with epinephrine. Under direct ultrasound guidance, an 18 gauge core needle device was advanced into the peripheral aspect of the cystic lymph node. Immediately following trocar needle placement, dark red/brown reflux from the inner stylet. As such, the trocar needle was connected to a evacuation bag in approximately 150 cc of additional dark red/brown fluid was aspirated. Next, the 18 gauge trocar needle was advanced into 2 adjacent more solid appearing lymph nodes within the right groin, likely correlating with the adjacent lymph node seen preceding abdominal CT image 93, series 2. Approximately 15 cc of slightly caseating fluid was aspirated from both of these adjacent lymph nodes. All aspirated fluid was capped and sent to the laboratory for surgical pathologic analysis. Following evacuation of the majority of the dominant cystic component of the more superficial lymph node, a complex fluid collection with the deeper aspect of the medial thigh likely correlating with the collection seen on image 84, series 2) was targeted for biopsy (ultrasound image 20) again, an 18 gauge trocar needle was advanced into this cystic lymph node and an additional 50 cc of dark red/brown fluid was aspirated. All aspirated fluid was capped and sent to the laboratory for cytologic analysis. Postprocedural imaging was and the procedure was terminated. Post procedure scan was negative for significant hematoma. A dressing was placed. The patient tolerated the procedure well without  immediate postprocedural complication. IMPRESSION: Technically successful ultrasound guided aspiration and biopsy of several adjacent predominantly cystic lymph nodes within the left groin. Note, approximately 200 cc of dark red/brown fluid was aspirated and sent to the laboratory for cytologic analysis. Electronically Signed   By: Sandi Mariscal M.D.   On: 06/20/2017 17:04    Labs:  CBC: Recent Labs    03/13/17 0827 06/06/17 1149 06/20/17 1120 06/22/17 0911 06/23/17 0630  WBC 9.1 14.0* 13.0* 20.9* 20.6*  HGB 8.2*  --  5.0* 6.3* 7.6*  HCT 24.6* 21.4* 15.8* 19.8* 22.4*  PLT 299 574* 476* 405* 354    COAGS: Recent Labs    11/21/16 0435 12/26/16 1148 06/20/17 1120  INR 0.98 0.92 1.13  APTT 31  --   --     BMP: Recent Labs    06/06/17 1149 06/20/17 1120 06/22/17 0911 06/23/17 0630  NA 140 139 136 138  K 3.6 3.4* 3.9 3.9  CL 102 104 103 105  CO2 26 25 26 26   GLUCOSE 114 117* 112 110*  BUN 9 12 9 8   CALCIUM 9.9 9.2 9.0 8.5*  CREATININE 1.82* 1.49* 1.44* 1.24  GFRNONAA 45* 57* 59* >60  GFRAA 52* >60 >60 >60    LIVER FUNCTION TESTS: Recent Labs    03/13/17 0827 06/06/17 1149 06/20/17 1120 06/22/17 0911  BILITOT 0.22 0.3 0.5 0.3  AST 15 14 16 14   ALT 10 9 13* 12  ALKPHOS 78 85 65 66  PROT 6.9 7.7 7.1 6.6  ALBUMIN 3.2* 3.2* 3.2* 2.7*    Assessment and Plan: Pt with known history of 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  Patient now complaining of some increased swelling in left inguinal/mid pelvic region.  Case discussed with Dr. Vernard Gambles and will obtain CT pelvis with limited IV contrast for further evaluation.  Latest creatinine 1.24.   Electronically Signed: D. Rowe Robert, PA-C 06/23/2017, 1:18 PM   I spent a total of 15 minutes at the the patient's bedside AND on the patient's hospital floor or unit, greater than  50% of which was counseling/coordinating care for recent left inguinal nodal mass biopsy

## 2017-06-23 NOTE — Progress Notes (Signed)
IP PROGRESS NOTE  Subjective:   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  He has bulky adenopathy that has been refractory to chemotherapy.  He underwent biopsy on 06/20/2017 to obtain additional tissue for molecular testing.  He was found to have a hemoglobin of 5 and over the last 2 days he developed symptoms of fever, shaking chills and was admitted for evaluation for urosepsis.  This morning, he feels slightly better but still complaining of pelvic and penile pain.  He does have periodic blood oozing from his penis since he has been on anticoagulation.   He does not report any headaches, blurry vision, syncope or seizures.  Does not report any cough, wheezing or hemoptysis.  Does not report any chest pain, palpitation, orthopnea.  Does not report any nausea, vomiting or abdominal pain.  Does not report any constipation or diarrhea.  Does not report any skeletal complaints.   Does not report any lymphadenopathy or petechiae.  Does not report any anxiety or depression.  Remaining review of systems is negative.    Objective:  Vital signs in last 24 hours: Temp:  [99.2 F (37.3 C)-103.1 F (39.5 C)] 100.2 F (37.9 C) (03/15 0430) Pulse Rate:  [103-127] 108 (03/15 0430) Resp:  [14-28] 16 (03/15 0430) BP: (107-146)/(52-87) 146/80 (03/15 0430) SpO2:  [98 %-100 %] 100 % (03/15 0430) Weight:  [204 lb (92.5 kg)] 204 lb (92.5 kg) (03/14 1139) Weight change:  Last BM Date: 06/21/17  Intake/Output from previous day: 03/14 0701 - 03/15 0700 In: 1906.7 [I.V.:806.7; IV Piggyback:1100] Out: -  General: Awake, alert chronically ill gentleman.  Appeared pale. Head: Normocephalic atraumatic. Mouth: mucous membranes moist, pharynx normal without lesions Eyes: No scleral icterus.  Pupils are equal and round reactive to light. Resp: clear to auscultation bilaterally without rhonchi or wheezes or dullness to percussion. Cardio: Tachycardic.   Regular rate and rhythm, S1, S2 normal, no murmur, click, rub or gallop GI: soft, non-tender; bowel sounds normal; no masses,  no organomegaly GU exam" pelvic adenopathy and inguinal masses palpated. Musculoskeletal: No joint deformity or effusion. Neurological: No motor, sensory deficits.  Intact deep tendon reflexes. Skin: No rashes or lesions.   Lab Results: Recent Labs    06/22/17 0911 06/23/17 0630  WBC 20.9* 20.6*  HGB 6.3* 7.6*  HCT 19.8* 22.4*  PLT 405* 354    BMET Recent Labs    06/22/17 0911 06/23/17 0630  NA 136 138  K 3.9 3.9  CL 103 105  CO2 26 26  GLUCOSE 112 110*  BUN 9 8  CREATININE 1.44* 1.24  CALCIUM 9.0 8.5*    Studies/Results: Dg Chest 2 View  Result Date: 06/22/2017 CLINICAL DATA:  Fever, just completed chemotherapy, sickle cell trait EXAM: CHEST - 2 VIEW COMPARISON:  CT chest of 05/24/2017 FINDINGS: No active infiltrate or effusion is seen. Right-sided Port-A-Cath tip overlies the lower SVC above the expected right atrial junction. Mediastinal and hilar contours are unremarkable and the heart is within normal limits in size. No bony abnormality is seen. IMPRESSION: No active lung disease. Right-sided Port-A-Cath tip overlies the lower SVC. Electronically Signed   By: Ivar Drape M.D.   On: 06/22/2017 12:17    Medications: I have reviewed the patient's current medications.  Assessment/Plan:  42 year old gentleman with the following issues:  1.  Fever and urinary tract infection: He presented with tachycardia in addition to abnormal UA.  Urine and blood cultures are currently  pending and he is currently on intravenous antibiotics in the form of Rocephin.  2.  Squamous cell carcinoma of the penis: He is cancer has been refractory to systemic chemotherapy.  Additional biopsy obtained on June 20, 2017 for molecular testing and attempt for a last line of treatment if his tumor is responsive to a targeted therapy or immunotherapy.  If there is no  actionable or targetable mutation in his biopsy, I am in favor of supportive care only and hospice.  He desires aggressive treatment and once additional therapy to be done if possible.  3.  Deep vein thrombosis: He is currently on Xarelto but has been complaining of bleeding with Xarelto is on hold for the time being and can be resumed in the future.  4.  Anemia: His hemoglobin is 7.6 after transfusion.  We will follow with you while he is hospitalized for any further recommendations.   LOS: 0 days   Zola Button 06/23/2017, 7:22 AM

## 2017-06-23 NOTE — Progress Notes (Addendum)
PROGRESS NOTE    Ronald Wilson   HFW:[id number]  DOB: 01/19/1976  DOA: 06/22/2017 PCP: [name], FNP   Brief Narrative:  Ronald Wilson is a 42 y.o. 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  On arrival to ED, he was found to be febrile, tachycardic, with normal blood pressure parameters. Further lab work revealed creatinine of 1.44, wbc count of 20.9 and platelet count of 405. His lactic acid was 0.86.  UA shows large leukocytes and wbc .   He reports pain in the groin area due to adenopathy and pain int he penile area.   He underwent a biopsy of left inguinal LN on 3/12 by IR  Subjective: He states that after the biopsy on 3/13, he had increased swelling in the left groin with swelling going towards the right. He also began noticing fevers the night of the biopsy. - has no dysuria or increased frequency of micturition - no cough or respiratory symptoms - no diarrhea or other GI symptoms   Assessment & Plan:   Principal Problem:   Sepsis - fever tachycardia leukocytosis - secondary to UTI?  - on Ceftriaxone & still having fever of 101.8 - WBC count is unchanged - blood cultures pending - urine culture show multiple species and recollection recommended - will recheck UA and culture --- increasing size of swelling in left groin after biopsy is also concerning for hematoma or abscess - I have asked for an IR opinion- they would like to obtain a CT today which I was thinking as well- CT report reviewed - no report of fluid collection- noted severe lymphadenopathy as before - will start Levaquin for UTI after repeating cultures- d/c Rocephin as is not keeping fever  down      Active Problems:   Anemia associated with chemotherapy - s/p 2 U PRBC- no acute bleeding other than oozing from cancer lesions per patient - check anemia panel and CBC in AM - Hb 7.6 today- will not transfuse further for now    Penile cancer with lymphadenopathy Severe Left external iliac and inguinal adenopathy causing left leg edema - Unfortunate situation- per Dr Hazeline Junker documentation, this has been resistant to chemotherapy - repeat bx of lymph node done on 3/12 is pending (to decide on further treatment) - per his Dr Hazeline Junker note today "If there is no actionable or targetable mutation in his biopsy, I am in favor of supportive care only and hospice."    DVT (deep venous thrombosis)  - ultrasound on 05/24/17 : (right arm) occlusive deep vein thrombosis of the proximal brachial, axillary, subclavian, and internal jugular vein  - on Xarelto   DVT prophylaxis: on xarelto Code Status: Full code Family Communication:  Disposition Plan:  f/u on fevers and Hb- LN biopsy pending as well Consultants:   Oncology  IR Procedures:   none Antimicrobials:  Anti-infectives (From admission, onward)   Start     Dose/Rate Route Frequency Ordered Stop   06/23/17 1400  cefTRIAXone (ROCEPHIN) 1 g in sodium chloride 0.9 % 100 mL IVPB     1 g 200 mL/hr over 30 Minutes Intravenous Every 24 hours 06/22/17 2215     06/22/17  1330  cefTRIAXone (ROCEPHIN) 1 g in sodium chloride 0.9 % 100 mL IVPB     1 g 200 mL/hr over 30 Minutes Intravenous  Once 06/22/17 1326 06/22/17 1446       Objective: Vitals:   06/23/17 0140 06/23/17 0145 06/23/17 0211 06/23/17 0430  BP: 107/81 107/81 118/78 (!) 146/80  Pulse: (!) 103 (!) 103 (!) 104 (!) 108  Resp: 16 16 14 16   Temp: 99.2 F (37.3 C) 99.2 F (37.3 C) 99.8 F (37.7 C) 100.2 F (37.9 C)  TempSrc: Oral Oral Oral Oral  SpO2: 100% 100% 100% 100%  Weight:      Height:        Intake/Output Summary (Last 24 hours) at 06/23/2017 1027 Last  data filed at 06/23/2017 0300 Gross per 24 hour  Intake 1906.67 ml  Output -  Net 1906.67 ml   Filed Weights   06/22/17 1139  Weight: 92.5 kg (204 lb)    Examination: General exam: Appears comfortable  HEENT: PERRLA, oral mucosa moist, no sclera icterus or thrush Respiratory system: Clear to auscultation. Respiratory effort normal. Cardiovascular system: S1 & S2 heard, RRR.  No murmurs  Gastrointestinal system: Abdomen soft, non-tender, nondistended. Normal bowel sound. No organomegaly Central nervous system: Alert and oriented. No focal neurological deficits. Extremities: No cyanosis, clubbing - extensive swelling in left groin and lesser swelling in right groin- swelling of all of left leg Skin:   Ulcers noted on penis Psychiatry:  Mood & affect appropriate.    Data Reviewed: I have personally reviewed following labs and imaging studies  CBC: Recent Labs  Lab 06/20/17 1120 06/22/17 0911 06/23/17 0630  WBC 13.0* 20.9* 20.6*  NEUTROABS 11.2* 18.9*  --   HGB 5.0* 6.3* 7.6*  HCT 15.8* 19.8* 22.4*  MCV 84.0 83.9 83.3  PLT 476* 405* 035   Basic Metabolic Panel: Recent Labs  Lab 06/20/17 1120 06/22/17 0911 06/23/17 0630  NA 139 136 138  K 3.4* 3.9 3.9  CL 104 103 105  CO2 25 26 26   GLUCOSE 117* 112 110*  BUN 12 9 8   CREATININE 1.49* 1.44* 1.24  CALCIUM 9.2 9.0 8.5*   GFR: Estimated Creatinine Clearance: 88 mL/min (by C-G formula based on SCr of 1.24 mg/dL). Liver Function Tests: Recent Labs  Lab 06/20/17 1120 06/22/17 0911  AST 16 14  ALT 13* 12  ALKPHOS 65 66  BILITOT 0.5 0.3  PROT 7.1 6.6  ALBUMIN 3.2* 2.7*   No results for input(s): LIPASE, AMYLASE in the last 168 hours. No results for input(s): AMMONIA in the last 168 hours. Coagulation Profile: Recent Labs  Lab 06/20/17 1120  INR 1.13   Cardiac Enzymes: No results for input(s): CKTOTAL, CKMB, CKMBINDEX, TROPONINI in the last 168 hours. BNP (last 3 results) No results for input(s): PROBNP in  the last 8760 hours. HbA1C: No results for input(s): HGBA1C in the last 72 hours. CBG: No results for input(s): GLUCAP in the last 168 hours. Lipid Profile: No results for input(s): CHOL, HDL, LDLCALC, TRIG, CHOLHDL, LDLDIRECT in the last 72 hours. Thyroid Function Tests: No results for input(s): TSH, T4TOTAL, FREET4, T3FREE, THYROIDAB in the last 72 hours. Anemia Panel: No results for input(s): VITAMINB12, FOLATE, FERRITIN, TIBC, IRON, RETICCTPCT in the last 72 hours. Urine analysis:    Component Value Date/Time   COLORURINE YELLOW 06/22/2017 1235   APPEARANCEUR HAZY (A) 06/22/2017 1235   LABSPEC 1.013 06/22/2017 1235   PHURINE 6.0 06/22/2017 1235   GLUCOSEU NEGATIVE 06/22/2017 1235  HGBUR LARGE (A) 06/22/2017 1235   BILIRUBINUR NEGATIVE 06/22/2017 1235   KETONESUR NEGATIVE 06/22/2017 1235   PROTEINUR 30 (A) 06/22/2017 1235   NITRITE NEGATIVE 06/22/2017 1235   LEUKOCYTESUR LARGE (A) 06/22/2017 1235   Sepsis Labs: @LABRCNTIP (procalcitonin:4,lacticidven:4) )No results found for this or any previous visit (from the past 240 hour(s)).   Radiology Studies: Dg Chest 2 View  Result Date: 06/22/2017 CLINICAL DATA:  Fever, just completed chemotherapy, sickle cell trait EXAM: CHEST - 2 VIEW COMPARISON:  CT chest of 05/24/2017 FINDINGS: No active infiltrate or effusion is seen. Right-sided Port-A-Cath tip overlies the lower SVC above the expected right atrial junction. Mediastinal and hilar contours are unremarkable and the heart is within normal limits in size. No bony abnormality is seen. IMPRESSION: No active lung disease. Right-sided Port-A-Cath tip overlies the lower SVC. Electronically Signed   By: Ivar Drape M.D.   On: 06/22/2017 12:17    Scheduled Meds: . amLODipine  10 mg Oral Daily   Continuous Infusions: . sodium chloride 100 mL/hr at 06/23/17 0955  . sodium chloride    . cefTRIAXone (ROCEPHIN)  IV       LOS: 0 days    Time spent in minutes: [place],  MD [facility] Pager: [email or web address] Password [facility] 06/23/2017, 10:27 AM

## 2017-06-23 NOTE — Progress Notes (Signed)
No evident complication seen from recent left inguinal node bx on today's CT. Images were reviewed by Dr. Vernard Gambles.

## 2017-06-23 NOTE — Progress Notes (Signed)
Dr. Wynelle Cleveland notified concerning HGB of 7.6.

## 2017-06-24 LAB — TYPE AND SCREEN
ABO/RH(D): O POS
ANTIBODY SCREEN: NEGATIVE
Unit division: 0
Unit division: 0

## 2017-06-24 LAB — URINALYSIS, ROUTINE W REFLEX MICROSCOPIC
BACTERIA UA: NONE SEEN
BILIRUBIN URINE: NEGATIVE
GLUCOSE, UA: NEGATIVE mg/dL
Ketones, ur: NEGATIVE mg/dL
NITRITE: NEGATIVE
PROTEIN: NEGATIVE mg/dL
Specific Gravity, Urine: 1.014 (ref 1.005–1.030)
Squamous Epithelial / LPF: NONE SEEN
pH: 7 (ref 5.0–8.0)

## 2017-06-24 LAB — BASIC METABOLIC PANEL
Anion gap: 10 (ref 5–15)
BUN: 11 mg/dL (ref 6–20)
CO2: 25 mmol/L (ref 22–32)
CREATININE: 1.19 mg/dL (ref 0.61–1.24)
Calcium: 8.3 mg/dL — ABNORMAL LOW (ref 8.9–10.3)
Chloride: 104 mmol/L (ref 101–111)
GFR calc Af Amer: >60 mL/min (ref >60)
Glucose, Bld: 110 mg/dL — ABNORMAL HIGH (ref 65–99)
POTASSIUM: 3.6 mmol/L (ref 3.5–5.1)
Sodium: 139 mmol/L (ref 135–145)

## 2017-06-24 LAB — CBC WITH DIFFERENTIAL/PLATELET
Basophils Absolute: 0 10*3/uL (ref 0.0–0.1)
Basophils Relative: 0 %
Eosinophils Absolute: 0 10*3/uL (ref 0.0–0.7)
Eosinophils Relative: 0 %
HEMATOCRIT: 22.5 % — AB (ref 39.0–52.0)
Hemoglobin: 7.5 g/dL — ABNORMAL LOW (ref 13.0–17.0)
LYMPHS ABS: 1.1 10*3/uL (ref 0.7–4.0)
Lymphocytes Relative: 5 %
MCH: 28 pg (ref 26.0–34.0)
MCHC: 33.3 g/dL (ref 30.0–36.0)
MCV: 84 fL (ref 78.0–100.0)
MONOS PCT: 5 %
Monocytes Absolute: 1.1 10*3/uL — ABNORMAL HIGH (ref 0.1–1.0)
Neutro Abs: 19.4 10*3/uL — ABNORMAL HIGH (ref 1.7–7.7)
Neutrophils Relative %: 90 %
Platelets: 369 10*3/uL (ref 150–400)
RBC: 2.68 MIL/uL — ABNORMAL LOW (ref 4.22–5.81)
RDW: 15.5 % (ref 11.5–15.5)
WBC: 21.6 10*3/uL — ABNORMAL HIGH (ref 4.0–10.5)

## 2017-06-24 LAB — BPAM RBC
BLOOD PRODUCT EXPIRATION DATE: 201904052359
Blood Product Expiration Date: 201904062359
ISSUE DATE / TIME: 201903150139
ISSUE DATE / TIME: 201903142240
UNIT TYPE AND RH: 5100
Unit Type and Rh: 5100

## 2017-06-24 LAB — CBC
HCT: 10.3 % — ABNORMAL LOW (ref 39.0–52.0)
Hemoglobin: 3.5 g/dL — CL (ref 13.0–17.0)
MCH: 28.7 pg (ref 26.0–34.0)
MCHC: 34 g/dL (ref 30.0–36.0)
MCV: 84.4 fL (ref 78.0–100.0)
PLATELETS: 464 10*3/uL — AB (ref 150–400)
RBC: 1.22 MIL/uL — AB (ref 4.22–5.81)
RDW: 15.8 % — ABNORMAL HIGH (ref 11.5–15.5)
WBC: 29 10*3/uL — ABNORMAL HIGH (ref 4.0–10.5)

## 2017-06-24 LAB — FOLATE: Folate: 11.4 ng/mL (ref >5.9)

## 2017-06-24 LAB — VITAMIN B12: Vitamin B-12: 411 pg/mL (ref 180–914)

## 2017-06-24 MED ORDER — SODIUM CHLORIDE 0.9 % IV SOLN
1.0000 g | INTRAVENOUS | Status: DC
  Administered 2017-06-24 – 2017-06-25 (×2): 1 g via INTRAVENOUS
  Filled 2017-06-24 (×2): qty 1

## 2017-06-24 NOTE — Progress Notes (Addendum)
Patient ID: Ronald Wilson, male   DOB: December 26, 1975, 42 y.o.   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 On arrival to ED, he was found to be febrile, tachycardic, with normal blood pressure parameters. Further lab work revealed creatinine of 1.44, wbc count of 20.9 and platelet count of 405. His lactic acid was 0.86. UA showed large leukocytes and wbc .    Assessment & Plan:   Principal Problem:   Sepsis secondary to UTI ([place]) / Leukocytosis  - Sepsis criteria met on admission with source of infection thought to be UTI - Urine and blood cx negative - Still spiking fevers and has elevated WBC count - Resume rocephin and monitor fever curve   Active Problems:   Penile cancer ([place]) - Management per oncology - CT pelvis showed mldly decreased left lower para-aortic and left common iliacs lymph node size. Stable left inguinal lymph node size. Interval increase in right inguinal lymph node size. Severe edema of left lower extremity, likely lymphatic obstruction. - Had biopsy 06/20/2017 of several adjacent predominantly cystic lymph nodes within the left groin. Note, approximately 200 cc of dark red/brown fluid was aspirated and sent to the laboratory for cytologic analysis.  - Per D. Shadad "If there is no actionable or targetable mutation in his biopsy, I am in favor of supportive care only and hospice."    Anemia associated with chemotherapy - Monitor Hgb - S/P 2 U PRBC transfusion    DVT (deep venous thrombosis) ([facility]) - Xarelto  on hold due to risk of bleeding      DVT prophylaxis: SCD's Code Status: full code  Family Communication: no family at bedside Disposition Plan: home once WBC count better and once afebrile    Consultants:   IR  Oncology, Dr. Alen Blew   Procedures:   None   Antimicrobials:   Rocephin 3/16 -->   Subjective: No overnight events.  Objective: Vitals:   06/23/17 2059 06/23/17 2157 06/23/17 2352 06/24/17 0514  BP: 137/87   (!) 148/81  Pulse: (!) 121   98  Resp: 20   18  Temp: (!) 102.4 F (39.1 C) (!) 102 F (38.9 C) 99.8 F (37.7 C) 98.6 F (37 C)  TempSrc: Oral Oral Oral Oral  SpO2: 99%   100%  Weight:      Height:        Intake/Output Summary (Last 24 hours) at 06/24/2017 1314 Last data filed at 06/24/2017 0516 Gross per 24 hour  Intake 480 ml  Output -  Net 480 ml   Filed Weights   06/22/17 1139  Weight: 92.5 kg (204 lb)    Examination:  General exam: Appears calm and comfortable  Respiratory system: Clear to auscultation. Respiratory effort normal. Cardiovascular system: S1 & S2 heard, RRR.  Gastrointestinal system: Abdomen is nondistended, soft and nontender. No organomegaly or masses felt. Normal bowel sounds heard. Central nervous system: Alert and oriented. No focal neurological deficits.. Skin: has swelling in lower inguinal area/ suprapubic tender to touch but no open wounds  Psychiatry: Judgement and  insight appear normal. Mood & affect appropriate.   Data Reviewed: I have personally reviewed following labs and imaging studies  CBC: Recent Labs  Lab 06/20/17 1120 06/22/17 0911 06/23/17 0630 06/24/17 0500 06/24/17 0613  WBC 13.0* 20.9* 20.6* 29.0* 21.6*  NEUTROABS 11.2* 18.9*  --   --  19.4*  HGB 5.0* 6.3* 7.6* 3.5* 7.5*  HCT 15.8* 19.8* 22.4* 10.3* 22.5*  MCV 84.0 83.9 83.3 84.4 84.0  PLT 476* 405* 354 464* 387   Basic Metabolic Panel: Recent Labs  Lab 06/20/17 1120 06/22/17 0911 06/23/17 0630 06/24/17 0500  NA 139 136 138  139  K 3.4* 3.9 3.9 3.6  CL 104 103 105 104  CO2 25 26 26 25   GLUCOSE 117* 112 110* 110*  BUN 12 9 8 11   CREATININE 1.49* 1.44* 1.24 1.19  CALCIUM 9.2 9.0 8.5* 8.3*   GFR: Estimated Creatinine Clearance: 91.7 mL/min (by C-G formula based on SCr of 1.19 mg/dL). Liver Function Tests: Recent Labs  Lab 06/20/17 1120 06/22/17 0911  AST 16 14  ALT 13* 12  ALKPHOS 65 66  BILITOT 0.5 0.3  PROT 7.1 6.6  ALBUMIN 3.2* 2.7*   No results for input(s): LIPASE, AMYLASE in the last 168 hours. No results for input(s): AMMONIA in the last 168 hours. Coagulation Profile: Recent Labs  Lab 06/20/17 1120  INR 1.13   Cardiac Enzymes: No results for input(s): CKTOTAL, CKMB, CKMBINDEX, TROPONINI in the last 168 hours. BNP (last 3 results) No results for input(s): PROBNP in the last 8760 hours. HbA1C: No results for input(s): HGBA1C in the last 72 hours. CBG: No results for input(s): GLUCAP in the last 168 hours. Lipid Profile: No results for input(s): CHOL, HDL, LDLCALC, TRIG, CHOLHDL, LDLDIRECT in the last 72 hours. Thyroid Function Tests: No results for input(s): TSH, T4TOTAL, FREET4, T3FREE, THYROIDAB in the last 72 hours. Anemia Panel: Recent Labs    06/23/17 1800 06/24/17 0500  VITAMINB12  --  411  FOLATE  --  11.4  FERRITIN 186  --   TIBC 190*  --   IRON <5*  --   RETICCTPCT 3.5*  --    Urine analysis:    Component Value Date/Time   COLORURINE YELLOW 06/24/2017 0520   APPEARANCEUR CLEAR 06/24/2017 0520   LABSPEC 1.014 06/24/2017 0520   PHURINE 7.0 06/24/2017 0520   GLUCOSEU NEGATIVE 06/24/2017 0520   HGBUR SMALL (A) 06/24/2017 0520   BILIRUBINUR NEGATIVE 06/24/2017 0520   KETONESUR NEGATIVE 06/24/2017 0520   PROTEINUR NEGATIVE 06/24/2017 0520   NITRITE NEGATIVE 06/24/2017 0520   LEUKOCYTESUR SMALL (A) 06/24/2017 0520   Sepsis Labs: @LABRCNTIP (procalcitonin:4,lacticidven:4)    Culture, blood (routine x 2)     Status: None (Preliminary result)   Collection Time:  06/22/17 12:35 PM  Result Value Ref Range Status   Specimen Description   Final   Special Requests   Final   Culture   Final    NO GROWTH < 24 HOURS Performed at [facility], [place] [address], [place], [place] [address]    Report Status PENDING  Incomplete  Urine culture     Status: Abnormal   Collection Time: 06/22/17 12:35 PM  Result Value Ref Range Status   Specimen Description   Final   Special Requests   Final    NONE Performed at [facility], [place] [address], [place], [place] [address]    Report Status 06/23/2017 FINAL  Final  Culture, blood (routine x 2)  Status: None (Preliminary result)   Collection Time: 06/22/17  1:55 PM  Result Value Ref Range Status   Specimen Description   Final   Special Requests   Final   Culture   Final    NO GROWTH < 24 HOURS Performed at [facility], [place] [address] [place], [place] [address]    Report Status PENDING  Incomplete      Radiology Studies: Dg Chest 2 View Result Date: 06/22/2017 No active lung disease. Right-sided Port-A-Cath tip overlies the lower SVC.   Ct Pelvis W Contrast Result Date: 06/23/2017 1. Mildly decreased left lower para-aortic and left common iliacs lymph node size. Stable left inguinal lymph node size. Interval increase in right inguinal lymph node size. 2. Severe edema of left lower extremity, likely lymphatic obstruction.  [place] Core Biopsy (lymph Nodes) Result Date: 06/20/2017 echnically successful ultrasound guided aspiration and biopsy of several adjacent predominantly cystic lymph nodes within the left groin. Note, approximately 200 cc of dark red/brown fluid was aspirated and sent to the laboratory for cytologic analysis.    Scheduled Meds: . amLODipine  10 mg Oral Daily   Continuous Infusions: . sodium chloride    . cefTRIAXone (ROCEPHIN)  IV       LOS: 1 day    Time spent: 25 minutes  Greater than 50% of the time spent on counseling and coordinating the  care.   Leisa Lenz, MD [facility] Pager [phone]  If 7PM-7AM, please contact night-coverage [email or web address] Password [email or web address] 06/24/2017, 1:14 PM

## 2017-06-25 ENCOUNTER — Inpatient Hospital Stay (HOSPITAL_COMMUNITY): Payer: Medicaid Other

## 2017-06-25 LAB — URINALYSIS, ROUTINE W REFLEX MICROSCOPIC
Bilirubin Urine: NEGATIVE
GLUCOSE, UA: NEGATIVE mg/dL
Ketones, ur: NEGATIVE mg/dL
Nitrite: NEGATIVE
PH: 6 (ref 5.0–8.0)
Protein, ur: NEGATIVE mg/dL
SPECIFIC GRAVITY, URINE: 1.008 (ref 1.005–1.030)
SQUAMOUS EPITHELIAL / LPF: NONE SEEN

## 2017-06-25 LAB — CBC
HCT: 23.9 % — ABNORMAL LOW (ref 39.0–52.0)
Hemoglobin: 7.8 g/dL — ABNORMAL LOW (ref 13.0–17.0)
MCH: 27.2 pg (ref 26.0–34.0)
MCHC: 32.6 g/dL (ref 30.0–36.0)
MCV: 83.3 fL (ref 78.0–100.0)
PLATELETS: 432 10*3/uL — AB (ref 150–400)
RBC: 2.87 MIL/uL — AB (ref 4.22–5.81)
RDW: 16.2 % — ABNORMAL HIGH (ref 11.5–15.5)
WBC: 21.6 10*3/uL — AB (ref 4.0–10.5)

## 2017-06-25 LAB — BASIC METABOLIC PANEL
ANION GAP: 10 (ref 5–15)
BUN: 11 mg/dL (ref 6–20)
CO2: 24 mmol/L (ref 22–32)
Calcium: 8.4 mg/dL — ABNORMAL LOW (ref 8.9–10.3)
Chloride: 101 mmol/L (ref 101–111)
Creatinine, Ser: 1.33 mg/dL — ABNORMAL HIGH (ref 0.61–1.24)
GLUCOSE: 132 mg/dL — AB (ref 65–99)
POTASSIUM: 3.5 mmol/L (ref 3.5–5.1)
Sodium: 135 mmol/L (ref 135–145)

## 2017-06-25 LAB — URINE CULTURE: Culture: <10000 — AB

## 2017-06-25 LAB — LACTIC ACID, PLASMA: Lactic Acid, Venous: 0.9 mmol/L (ref 0.5–1.9)

## 2017-06-25 MED ORDER — IBUPROFEN 200 MG PO TABS
200.0000 mg | ORAL_TABLET | Freq: Four times a day (QID) | ORAL | Status: DC | PRN
  Administered 2017-06-25: 200 mg via ORAL
  Filled 2017-06-25: qty 1

## 2017-06-25 MED ORDER — PIPERACILLIN-TAZOBACTAM 3.375 G IVPB
3.3750 g | Freq: Three times a day (TID) | INTRAVENOUS | Status: DC
  Administered 2017-06-25 – 2017-06-27 (×5): 3.375 g via INTRAVENOUS
  Filled 2017-06-25 (×6): qty 50

## 2017-06-25 MED ORDER — SODIUM CHLORIDE 0.9 % IV SOLN
INTRAVENOUS | Status: DC
  Administered 2017-06-25: 18:00:00 via INTRAVENOUS

## 2017-06-25 MED ORDER — SODIUM CHLORIDE 0.9 % IV BOLUS (SEPSIS)
500.0000 mL | Freq: Once | INTRAVENOUS | Status: AC
  Administered 2017-06-25: 500 mL via INTRAVENOUS

## 2017-06-25 MED ORDER — VANCOMYCIN HCL 10 G IV SOLR
2000.0000 mg | Freq: Once | INTRAVENOUS | Status: AC
  Administered 2017-06-25: 2000 mg via INTRAVENOUS
  Filled 2017-06-25: qty 2000

## 2017-06-25 MED ORDER — LIP MEDEX EX OINT
TOPICAL_OINTMENT | CUTANEOUS | Status: DC | PRN
  Administered 2017-06-25: 02:00:00 via TOPICAL
  Filled 2017-06-25: qty 7

## 2017-06-25 MED ORDER — VANCOMYCIN HCL 10 G IV SOLR
1250.0000 mg | INTRAVENOUS | Status: DC
  Administered 2017-06-26: 1250 mg via INTRAVENOUS
  Filled 2017-06-25: qty 1250

## 2017-06-25 NOTE — Progress Notes (Signed)
Patient ID: Ronald Wilson, male   DOB: 1975/07/19, 42 y.o.   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 On arrival to ED, he was found to be febrile, tachycardic, with normal blood pressure parameters. Further lab work revealed creatinine of 1.44, wbc count of 20.9 and platelet count of 405. His lactic acid was 0.86. UA showed large leukocytes and wbc.   Assessment & Plan:   Principal Problem:   Sepsis secondary to UTI ([place]) / Leukocytosis  - Sepsis criteria met on admission with source of infection thought to be UTI - Urine and blood cx on admission negative - Patient is still spiking fever, 101.72F and 102.72F  in past 24 hours. We resumed Rocephin yesterday 06/24/2017 - Considering he still has persistent leukocytosis of 21.6, we will obtain another urinalysis, urine culture and blood culture - CXR on 3/14 with no active lung disease  - CT pelvis on 3/15 showed mildly decreased left lower para-aortic and left common iliacs lymph node size. Stable left inguinal lymph node size. Interval increase in right inguinal lymph node size. Severe edema of left lower extremity, likely lymphatic obstruction.   Active Problems:   Penile cancer (HCC) - CT pelvis showed mldly decreased left lower para-aortic and left common iliacs lymph node size. Stable left inguinal lymph node size. Interval increase in right inguinal lymph node size. Severe edema of left lower extremity, likely lymphatic obstruction. - Had biopsy 06/20/2017 of several  adjacent predominantly cystic lymph nodes within the left groin. Note, approximately 200 cc of dark red/brown fluid was aspirated and sent to the laboratory for cytologic analysis.  - Per D. Shadad "If there is no actionable or targetable mutation in his biopsy, I am in favor of supportive care only and hospice." - Management per oncology     Left lower extremity edema - Thought to be from lymphatic obstruction     Anemia associated with chemotherapy and CKD - S/P 2 U PRBC transfusion during this hospital stay - Hgb 7.8 this am - Transfuse for hgb less than 7     CKD stage 2 - Recent baseline Cr in 06/2017 was 1.49 - Cr within baseline range     DVT (deep venous thrombosis) (HCC) - Xarelto on hold due to risk of bleeding - Repeat CBC in am, if hgb stable consider resuming xarelto     DVT prophylaxis: SCD's Code Status: full code  Family Communication: no family at bedside  Disposition Plan: home once sepsis etiology resolves    Consultants:   IR  Oncology, Dr. Alen Blew   Procedures:   None   Antimicrobials:   Rocephin 3/16 -->   Subjective: No overnight events.  Objective: Vitals:   06/24/17 1411 06/24/17 1633 06/24/17 2033 06/25/17 0548  BP: (!) 144/88  (!) 158/86 (!) 144/94  Pulse: 100  (!) 126 (!) 135  Resp: 18  20 20   Temp: (!) 102.7 F (39.3 C) 100 F (37.8 C) (!) 101 F (38.3 C) (!) 101.7 F (38.7 C)  TempSrc: Oral  Oral Oral  SpO2: 100%  99% 100%  Weight:      Height:        Intake/Output Summary (Last 24 hours) at 06/25/2017 1114 Last data filed at 06/25/2017 0942 Gross per 24 hour  Intake 1560 ml  Output -  Net 1560 ml   Filed Weights   06/22/17 1139  Weight: 92.5 kg (204 lb)    Physical Exam  Constitutional: Appears well-developed and well-nourished. No distress.  CVS: RRR, S1/S2 + Pulmonary: Effort and breath sounds normal, no stridor, rhonchi, wheezes, rales.  Abdominal: Soft. BS +,  Has swelling in lower pelvis/inguinal  area Musculoskeletal: Normal range of motion. No tenderness.  Lymphadenopathy: lymph node swollen in lower pelvic/inguinal area  Neuro: Alert. Normal reflexes, muscle tone coordination. No cranial nerve deficit. Skin: Skin is warm and dry.  Psychiatric: Normal mood and affect. Behavior, judgment, thought content normal.    Data Reviewed: I have personally reviewed following labs and imaging studies  CBC: Recent Labs  Lab 06/20/17 1120 06/22/17 0911 06/23/17 0630 06/24/17 0500 06/24/17 0613 06/25/17 0625  WBC 13.0* 20.9* 20.6* 29.0* 21.6* 21.6*  NEUTROABS 11.2* 18.9*  --   --  19.4*  --   HGB 5.0* 6.3* 7.6* 3.5* 7.5* 7.8*  HCT 15.8* 19.8* 22.4* 10.3* 22.5* 23.9*  MCV 84.0 83.9 83.3 84.4 84.0 83.3  PLT 476* 405* 354 464* 369 448*   Basic Metabolic Panel: Recent Labs  Lab 06/20/17 1120 06/22/17 0911 06/23/17 0630 06/24/17 0500 06/25/17 0625  NA 139 136 138 139 135  K 3.4* 3.9 3.9 3.6 3.5  CL 104 103 105 104 101  CO2 25 26 26 25 24   GLUCOSE 117* 112 110* 110* 132*  BUN 12 9 8 11 11   CREATININE 1.49* 1.44* 1.24 1.19 1.33*  CALCIUM 9.2 9.0 8.5* 8.3* 8.4*   GFR: Estimated Creatinine Clearance: 82.1 mL/min (A) (by C-G formula based on SCr of 1.33 mg/dL (H)). Liver Function Tests: Recent Labs  Lab 06/20/17 1120 06/22/17 0911  AST 16 14  ALT 13* 12  ALKPHOS 65 66  BILITOT 0.5 0.3  PROT 7.1 6.6  ALBUMIN 3.2* 2.7*   No results for input(s): LIPASE, AMYLASE in the last 168 hours. No results for input(s): AMMONIA in the last 168 hours. Coagulation Profile: Recent Labs  Lab 06/20/17 1120  INR 1.13   Cardiac Enzymes: No results for input(s): CKTOTAL, CKMB, CKMBINDEX, TROPONINI in the last 168 hours. BNP (last 3 results) No results for input(s): PROBNP in the last 8760 hours. HbA1C: No results for input(s): HGBA1C in the last 72 hours. CBG: No results for input(s): GLUCAP in the last 168 hours. Lipid Profile: No results for input(s): CHOL, HDL, LDLCALC, TRIG,  CHOLHDL, LDLDIRECT in the last 72 hours. Thyroid Function Tests: No results for input(s): TSH, T4TOTAL, FREET4, T3FREE, THYROIDAB in the last 72 hours. Anemia Panel: Recent Labs    06/23/17 1800 06/24/17 0500  VITAMINB12  --  411  FOLATE  --  11.4  FERRITIN 186  --   TIBC 190*  --   IRON <5*  --   RETICCTPCT 3.5*  --    Urine analysis:    Component Value Date/Time   COLORURINE YELLOW 06/24/2017 0520   APPEARANCEUR CLEAR 06/24/2017 0520   LABSPEC 1.014 06/24/2017 0520   PHURINE 7.0 06/24/2017 0520   GLUCOSEU NEGATIVE 06/24/2017 0520   HGBUR SMALL (A) 06/24/2017 0520   BILIRUBINUR NEGATIVE 06/24/2017 0520   KETONESUR NEGATIVE 06/24/2017 0520   PROTEINUR NEGATIVE 06/24/2017 0520  NITRITE NEGATIVE 06/24/2017 0520   LEUKOCYTESUR SMALL (A) 06/24/2017 0520   Sepsis Labs: @LABRCNTIP (procalcitonin:4,lacticidven:4)    Culture, blood (routine x 2)     Status: None (Preliminary result)   Collection Time: 06/22/17 12:35 PM  Result Value Ref Range Status   Specimen Description   Final   Special Requests   Final   Culture   Final    NO GROWTH < 24 HOURS Performed at [facility], [place] [address], [place], [place] [address]    Report Status PENDING  Incomplete  Urine culture     Status: Abnormal   Collection Time: 06/22/17 12:35 PM  Result Value Ref Range Status   Specimen Description   Final   Special Requests   Final    NONE Performed at [facility], [place] [address] [place], [place] [address]    Report Status 06/23/2017 FINAL  Final  Culture, blood (routine x 2)     Status: None (Preliminary result)   Collection Time: 06/22/17  1:55 PM  Result Value Ref Range Status   Specimen Description   Final   Special Requests   Final   Culture   Final    NO GROWTH < 24 HOURS Performed at [facility], [place] [address], [place], [place] [address]    Report Status PENDING  Incomplete      Radiology Studies: Dg Chest 2 View Result Date:  06/22/2017 No active lung disease. Right-sided Port-A-Cath tip overlies the lower SVC.   Ct Pelvis W Contrast Result Date: 06/23/2017 1. Mildly decreased left lower para-aortic and left common iliacs lymph node size. Stable left inguinal lymph node size. Interval increase in right inguinal lymph node size. 2. Severe edema of left lower extremity, likely lymphatic obstruction.  [place] Core Biopsy (lymph Nodes) Result Date: 06/20/2017 echnically successful ultrasound guided aspiration and biopsy of several adjacent predominantly cystic lymph nodes within the left groin. Note, approximately 200 cc of dark red/brown fluid was aspirated and sent to the laboratory for cytologic analysis.    Scheduled Meds: . amLODipine  10 mg Oral Daily   Continuous Infusions: . sodium chloride    . cefTRIAXone (ROCEPHIN)  IV 1 g (06/24/17 1728)     LOS: 2 days    Time spent: 25 minutes  Greater than 50% of the time spent on counseling and coordinating the care.   Leisa Lenz, MD [facility] Pager [phone]  If 7PM-7AM, please contact night-coverage [email or web address] Password [email or web address] 06/25/2017, 11:14 AM

## 2017-06-25 NOTE — Progress Notes (Signed)
Pharmacy Antibiotic Note  Ronald Wilson is a 42 y.o. male admitted on 06/22/2017 with sepsis.  Pharmacy has been consulted for vanc/Zosyn dosing.  Plan: 1) Vancomycin 2g x 1 then 1250mg  IV q24 - goal AUC 400-500 2) Zosyn 3.375g IV q8 (extended interval infusion)   Height: 5\' 9"  (175.3 cm) Weight: 204 lb (92.5 kg) IBW/kg (Calculated) : 70.7  Temp (24hrs), Avg:101.4 F (38.6 C), Min:101 F (38.3 C), Max:101.7 F (38.7 C)  Recent Labs  Lab 06/20/17 1120 06/22/17 0911 06/22/17 1305 06/23/17 0630 06/24/17 0500 06/24/17 0613 06/25/17 0625  WBC 13.0* 20.9*  --  20.6* 29.0* 21.6* 21.6*  CREATININE 1.49* 1.44*  --  1.24 1.19  --  1.33*  LATICACIDVEN  --   --  0.86  --   --   --   --     Estimated Creatinine Clearance: 82.1 mL/min (A) (by C-G formula based on SCr of 1.33 mg/dL (H)).    No Known Allergies   Thank you for allowing pharmacy to be a part of this patient's care.   Adrian Saran, PharmD, BCPS Pager [phone] 06/25/2017 5:44 PM

## 2017-06-26 ENCOUNTER — Other Ambulatory Visit: Payer: Self-pay | Admitting: Oncology

## 2017-06-26 DIAGNOSIS — N10 Acute tubulo-interstitial nephritis: Secondary | ICD-10-CM

## 2017-06-26 DIAGNOSIS — D62 Acute posthemorrhagic anemia: Secondary | ICD-10-CM

## 2017-06-26 DIAGNOSIS — D649 Anemia, unspecified: Secondary | ICD-10-CM

## 2017-06-26 DIAGNOSIS — Z515 Encounter for palliative care: Secondary | ICD-10-CM

## 2017-06-26 LAB — CREATININE, SERUM
Creatinine, Ser: 1.15 mg/dL (ref 0.61–1.24)
GFR calc Af Amer: >60 mL/min (ref >60)

## 2017-06-26 NOTE — Progress Notes (Signed)
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  06/26/17: Patient seen and examined at his bedside.  Reports wounds suprapubic wound has been bleeding all night.  Wound care specialist asked to see.  Recommends surgical evaluation.  Febrile overnight with T-max of 102.4 with leukocytosis WBC 21 from 06/25/17.  Assessment/Plan: Principal Problem:   Sepsis secondary to UTI [facility]) Active Problems:   Penile cancer ([place])   Anemia associated with chemotherapy   DVT (deep venous thrombosis) (HCC)   Sepsis ([place])  Sepsis 2/2 to suspected suprapubic wound Wound culture ordered General surgery consulted for possible I&D Febrile overnight with Tmax 102.4 with leukocytosis Continue IV vancomycin and IV Zosyn empirically until results of wound culture  Penile cancer Oncology following Last radiation was January 2019 Urology paged. Awaiting call back. Biopsy 06/20/17 of cystic lymph node within lymph node  Left upper extremity DVT  Xarelto being held due to acute bleeding from suprapubic wound  Hypertension Well-controlled  AK I on CKD 3 Creatinine on presentation 1.49 Creatinine 1.19 avoid nephrotoxic agents hypotension dehydration Repeat BMP in the morning  Anemia chronic disease complicated by acute blood loss Chronic disease from penile cancer Hemoglobin 7.8 Bleeding noted on suprapubic wound Continue to monitor CBC   Code Status: Full  Family Communication: None at bedside  Disposition Plan: Home  When hemodynamically  stable.   Consultants:  General surgery paged  Procedures:  None   Antimicrobials:  IV Vancomycin and IV zosyn  DVT prophylaxis:  SCDs, Xarelto being held due to acute bleeding   Objective: Vitals:   06/25/17 1908 06/25/17 2125 06/26/17 0430 06/26/17 1133  BP:  126/74 133/74 122/71  Pulse:  (!) 113 (!) 118 (!) 101  Resp:  20 20 20   Temp: (!) 102.4 F (39.1 C) 99.5 F (37.5 C) 99 F (37.2 C) 99.8 F (37.7 C)  TempSrc: Oral Oral Oral Oral  SpO2:  98% 98% 99%  Weight:      Height:        Intake/Output Summary (Last 24 hours) at 06/26/2017 1434 Last data filed at 06/26/2017 1017 Gross per 24 hour  Intake 1300 ml  Output -  Net 1300 ml   Filed Weights   06/22/17 1139  Weight: 92.5 kg (204 lb)    Exam:   General:  42 yo AAM WD WN A&O x 3   Cardiovascular: RRR no rubs or gallops   Respiratory: CTA no wheezes or rales  Abdomen: soft NT ND NBS x4  Musculoskeletal: Non focal  Skin: end of penis with lesions; suprapubic lesion with malodorous purulent drainage  Psychiatry: Mood is appropriate    Data Reviewed: CBC: Recent Labs  Lab 06/20/17 1120 06/22/17 0911 06/23/17 0630 06/24/17 0500 06/24/17 0613 06/25/17 0625  WBC 13.0* 20.9* 20.6* 29.0* 21.6* 21.6*  NEUTROABS 11.2* 18.9*  --   --  19.4*  --   HGB 5.0* 6.3* 7.6* 3.5* 7.5* 7.8*  HCT 15.8* 19.8* 22.4* 10.3* 22.5* 23.9*  MCV 84.0 83.9 83.3 84.4 84.0 83.3  PLT 476* 405* 354 464* 369 323*   Basic Metabolic Panel: Recent  Labs  Lab 06/20/17 1120 06/22/17 0911 06/23/17 0630 06/24/17 0500 06/25/17 0625 06/26/17 0643  NA 139 136 138 139 135  --   K 3.4* 3.9 3.9 3.6 3.5  --   CL 104 103 105 104 101  --   CO2 25 26 26 25 24   --   GLUCOSE 117* 112 110* 110* 132*  --   BUN 12 9 8 11 11   --   CREATININE 1.49* 1.44* 1.24 1.19 1.33* 1.15  CALCIUM 9.2 9.0 8.5* 8.3* 8.4*  --    GFR: Estimated Creatinine Clearance: 94.9 mL/min (by C-G formula based on SCr of 1.15 mg/dL). Liver Function  Tests: Recent Labs  Lab 06/20/17 1120 06/22/17 0911  AST 16 14  ALT 13* 12  ALKPHOS 65 66  BILITOT 0.5 0.3  PROT 7.1 6.6  ALBUMIN 3.2* 2.7*   No results for input(s): LIPASE, AMYLASE in the last 168 hours. No results for input(s): AMMONIA in the last 168 hours. Coagulation Profile: Recent Labs  Lab 06/20/17 1120  INR 1.13   Cardiac Enzymes: No results for input(s): CKTOTAL, CKMB, CKMBINDEX, TROPONINI in the last 168 hours. BNP (last 3 results) No results for input(s): PROBNP in the last 8760 hours. HbA1C: No results for input(s): HGBA1C in the last 72 hours. CBG: No results for input(s): GLUCAP in the last 168 hours. Lipid Profile: No results for input(s): CHOL, HDL, LDLCALC, TRIG, CHOLHDL, LDLDIRECT in the last 72 hours. Thyroid Function Tests: No results for input(s): TSH, T4TOTAL, FREET4, T3FREE, THYROIDAB in the last 72 hours. Anemia Panel: Recent Labs    06/23/17 1800 06/24/17 0500  VITAMINB12  --  411  FOLATE  --  11.4  FERRITIN 186  --   TIBC 190*  --   IRON <5*  --   RETICCTPCT 3.5*  --    Urine analysis:    Component Value Date/Time   COLORURINE YELLOW 06/25/2017 1117   APPEARANCEUR CLEAR 06/25/2017 1117   LABSPEC 1.008 06/25/2017 1117   PHURINE 6.0 06/25/2017 1117   GLUCOSEU NEGATIVE 06/25/2017 1117   HGBUR SMALL (A) 06/25/2017 1117   BILIRUBINUR NEGATIVE 06/25/2017 1117   KETONESUR NEGATIVE 06/25/2017 1117   PROTEINUR NEGATIVE 06/25/2017 1117   NITRITE NEGATIVE 06/25/2017 1117   LEUKOCYTESUR SMALL (A) 06/25/2017 1117   Sepsis Labs: @LABRCNTIP (procalcitonin:4,lacticidven:4)  ) Recent Results (from the past 240 hour(s))  Culture, blood (routine x 2)     Status: None (Preliminary result)   Collection Time: 06/22/17 12:35 PM  Result Value Ref Range Status   Specimen Description   Final    BLOOD RIGHT PORTA CATH Performed at [facility], [place] [address] [place], [place] [address]    Special Requests   Final    BOTTLES  DRAWN AEROBIC AND ANAEROBIC Blood Culture adequate volume Performed at [place] [address], [place], [place] [address]    Culture   Final    NO GROWTH 3 DAYS Performed at [facility], [place] [address] [place], [place] [address]    Report Status PENDING  Incomplete  Urine culture     Status: Abnormal   Collection Time: 06/22/17 12:35 PM  Result Value Ref Range Status   Specimen Description   Final    URINE, CLEAN CATCH Performed at [facility], [place] [address] [place], [place] [address]    Special Requests   Final    NONE Performed at [facility], [place] [address] [place],  [place] [address]    Culture MULTIPLE SPECIES PRESENT, SUGGEST RECOLLECTION (A)  Final   Report Status 06/23/2017 FINAL  Final  Culture, blood (routine x 2)     Status: None (Preliminary result)   Collection Time: 06/22/17  1:55 PM  Result Value Ref Range Status   Specimen Description   Final    BLOOD RIGHT ANTECUBITAL Performed at [place] [address], [place], [place] [address]    Special Requests   Final    BOTTLES DRAWN AEROBIC AND ANAEROBIC Blood Culture adequate volume Performed at [place] [address], [place], [place] [address]    Culture   Final    NO GROWTH 3 DAYS Performed at [facility], [place] [address], [place], [place] [address]    Report Status PENDING  Incomplete  Culture, Urine     Status: Abnormal   Collection Time: 06/24/17  5:20 AM  Result Value Ref Range Status   Specimen Description   Final    URINE, CLEAN CATCH Performed at [facility], [place] [address] [place], [place] [address]    Special Requests   Final    Rocephin Immunocompromised Performed at [facility], [place] [address] [place], [place] [address]    Culture (A)  Final    <10,000 COLONIES/mL INSIGNIFICANT GROWTH Performed at [place] [address], [place], [place] [address]    Report Status 06/25/2017 FINAL  Final      Studies: Dg Chest Port 1 View  Result Date: 06/26/2017 CLINICAL DATA:  Fever EXAM: PORTABLE CHEST 1 VIEW COMPARISON:  06/22/2017 FINDINGS: The heart size and mediastinal contours are within normal limits. Both lungs are clear. Minimal atelectasis or scarring is seen at the left lung base. Right-sided port catheter is noted with tip at the cavoatrial juncture. The visualized skeletal structures are unremarkable. IMPRESSION: No active disease. Minimal atelectasis and/or scarring at the left lung base. Port catheter tip at the cavoatrial junction. Electronically Signed   By: Ashley Royalty M.D.   On: 06/26/2017 00:04    Scheduled Meds: . amLODipine  10 mg Oral Daily    Continuous Infusions: . sodium chloride 50 mL/hr at 06/25/17 1746  . piperacillin-tazobactam (ZOSYN)  IV 3.375 g (06/26/17 0959)  . vancomycin       LOS: 3 days     Kayleen Memos, MD [facility] Pager [phone]  If 7PM-7AM, please contact night-coverage [email or web address] Password [facility] 06/26/2017, 2:34 PM

## 2017-06-26 NOTE — Progress Notes (Signed)
Palliative Medicine consult noted. Due to high referral volume, there may be a delay seeing this patient. Please call the Palliative Medicine Team office at [phone] if recommendations are needed in the interim.  Thank you for inviting [place] to see this patient.  Marjie Skiff Donasia Wimes, RN, BSN, [facility] Palliative Medicine Team 06/26/2017 11:43 AM Office [phone]

## 2017-06-26 NOTE — Progress Notes (Signed)
Ronald Wilson noted to have a baseball size growth to his perineal area. When rounding at this time, this nurse noted that this growth had ruptured and busted open. Drainage from this site consisted of red tinge color and noted to have serosanguinous appearance with an foul, malodorous odor. EBL of 200 ml. Patient was medicated for his comfort and [facility] was notified of the above.

## 2017-06-26 NOTE — Consult Note (Signed)
[place] Nurse wound consult note Reason for Consult: Consulted for ruptured cyst or other abscess in the suprapubic area. 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 Patient with recent history of fever. Lesion ruptured last night according to patient and bedside nurses.  Patient reports pain in the area is much improved since spontaneous rupture. Wound type: oncologic vs infectious Pressure Injury POA: NA Measurement:2.6cm x 5cm area of ruptured lesion. No defect. Serous fluid expressed with gentle pressure today. Nursing reports bloody and purulent discharge during the night when lesions ruptured. Wound [id number] to visualize wound bed.   Drainage (amount, consistency, odor) As described above Periwound: intact, indurated Dressing procedure/placement/frequency: Patient is on systemic antibiotics. As I am unsure of the etiology of the lesion in this complex medical patient, it may be prudent to involve surgery who may consider further workup (e.g., scan) or intervention (e.g. interventional radiology) to determine if additional fluid or infection persists with or without a drain placement. In the interim, I have provided Nursing with conservative care orders.  [place] nursing team will not follow, but will remain available to this patient, the nursing and medical teams.  Please re-consult if needed. Thanks, Maudie Flakes, MSN, RN, [place], [facility]  Pager# [phone]

## 2017-06-26 NOTE — Consult Note (Signed)
Reason for Consult:  Purulent drainage suprapubic wound  referring Physician:  Dr. Aileen Fass    Ronald Wilson is an 42 y.o. male.   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  He is undergone 3 cycles of chemo and radiation therapy.  He recently was diagnosed with DVT in the upper extremities is on Xarelto, last dose before his lymph node biopsy on 06/20/17.  He was admitted by the medicine service on 06/22/17 with anemia, fever, tachycardia, leukocytosis and abnormal UA.  He was placed on Rocephin for possible UTI. He continues to run fevers up to 102.7.  It is trending down over the last 12 hours.  WBC on admission 29,000.  His last CBC on 317 was 21.6.  His admission hemoglobin was 3.5 with a hematocrit of 10.  His last hemoglobin was 7.8 with a hematocrit of   0.323. Platelets are normal.  Urinalysis x2 showed 6-30 white cells.  Urine culture is pending, blood cultures are pending with no growth so far.  He was seen today by the skin care and nurse and was found to have a abscess in the suprapubic area site measured 2.6 x 5 cm and we are asked to see the patient for evaluation. He states that after his recent biopsy his suprapubic pressure/tenderness increased and the area became more raised and bouncy. He reports the skin over the area became more shiny but denies redness around the area.   Past Medical History:  Diagnosis Date  . Hypertension   . Inguinal mass 11/17/2016   left  . Sickle cell trait ([place])   . squamous cell penile ca dx'd 11/2015    Past Surgical History:  Procedure Laterality Date  . IR FLUORO GUIDE PORT INSERTION RIGHT  12/26/2016  . IR US GUIDE VASC ACCESS RIGHT  12/26/2016  . NO PAST SURGERIES      Family History  Problem Relation Age of Onset  . Hypertension Mother   . Cancer Maternal Aunt        unknown    Social History:  reports that he has been smoking cigarettes.  He has a 6.25  pack-year smoking history. he has never used smokeless tobacco. He reports that he drinks about 9.0 oz of alcohol per week. He reports that he uses drugs. Drug: Marijuana.  Allergies: No Known Allergies  Prior to Admission medications   Medication Sig Start Date End Date Taking? Authorizing Provider  amLODipine (NORVASC) 10 MG tablet Take 1 tablet (10 mg total) by mouth daily. 05/30/17  Yes Wyatt Portela, MD  lidocaine (XYLOCAINE) 2 % jelly Apply 1 application topically as needed. 05/24/17  Yes Bruning, Ashlyn, PA-C  lidocaine-prilocaine (EMLA) cream Apply 1 application topically as needed. Apply to porta cath before chemotherapy. 12/16/16  Yes Wyatt Portela, MD  oxyCODONE-acetaminophen (PERCOCET/ROXICET) 5-325 MG tablet Take 1 tablet by mouth every 4 (four) hours as needed for severe pain. 06/13/17  Yes Wyatt Portela, MD  prochlorperazine (COMPAZINE) 10 MG tablet Take 1 tablet (10 mg total) by mouth every 6 (six) hours as needed for nausea or vomiting. 12/16/16  Yes Wyatt Portela, MD  Rivaroxaban 15 & 20 MG TBPK Take as directed on package: One 87m tablet by mouth twice a day with food for 3 weeks, then one 234mtablet once a day with food. 05/29/17  Yes Bruning, Ashlyn, PA-C  enoxaparin (LOVENOX) 30 MG/0.3ML injection Inject 1 mL (100  mg total) into the skin every 12 (twelve) hours for 9 doses. 05/24/17 05/29/17  Bruning, Ailene Ards, PA-C   Anti-infectives (From admission, onward)   Start     Dose/Rate Route Frequency Ordered Stop   06/26/17 2000  vancomycin (VANCOCIN) 1,250 mg in sodium chloride 0.9 % 250 mL IVPB     1,250 mg 166.7 mL/hr over 90 Minutes Intravenous Every 24 hours 06/25/17 1749     06/25/17 1800  vancomycin (VANCOCIN) 2,000 mg in sodium chloride 0.9 % 500 mL IVPB     2,000 mg 250 mL/hr over 120 Minutes Intravenous  Once 06/25/17 1749 06/25/17 2026   06/25/17 1800  piperacillin-tazobactam (ZOSYN) IVPB 3.375 g     3.375 g 12.5 mL/hr over 240 Minutes Intravenous Every 8 hours  06/25/17 1749     06/24/17 1600  cefTRIAXone (ROCEPHIN) 1 g in sodium chloride 0.9 % 100 mL IVPB  Status:  Discontinued     1 g 200 mL/hr over 30 Minutes Intravenous Every 24 hours 06/24/17 1310 06/25/17 1726   06/23/17 1400  cefTRIAXone (ROCEPHIN) 1 g in sodium chloride 0.9 % 100 mL IVPB  Status:  Discontinued     1 g 200 mL/hr over 30 Minutes Intravenous Every 24 hours 06/22/17 2215 06/23/17 1435   06/22/17 1330  cefTRIAXone (ROCEPHIN) 1 g in sodium chloride 0.9 % 100 mL IVPB     1 g 200 mL/hr over 30 Minutes Intravenous  Once 06/22/17 1326 06/22/17 1446      Results for orders placed or performed during the hospital encounter of 06/22/17 (from the past 48 hour(s))  CBC     Status: Abnormal   Collection Time: 06/25/17  6:25 AM  Result Value Ref Range   WBC 21.6 (H) 4.0 - 10.5 K/uL   RBC 2.87 (L) 4.22 - 5.81 MIL/uL   Hemoglobin 7.8 (L) 13.0 - 17.0 g/dL   HCT 23.9 (L) 39.0 - 52.0 %   MCV 83.3 78.0 - 100.0 fL   MCH 27.2 26.0 - 34.0 pg   MCHC 32.6 30.0 - 36.0 g/dL   RDW 16.2 (H) 11.5 - 15.5 %   Platelets 432 (H) 150 - 400 K/uL    Comment: Performed at [facility], [place] [address], [place], [place] [address]  Basic metabolic panel     Status: Abnormal   Collection Time: 06/25/17  6:25 AM  Result Value Ref Range   Sodium 135 135 - 145 mmol/L   Potassium 3.5 3.5 - 5.1 mmol/L   Chloride 101 101 - 111 mmol/L   CO2 24 22 - 32 mmol/L   Glucose, Bld 132 (H) 65 - 99 mg/dL   BUN 11 6 - 20 mg/dL   Creatinine, Ser 1.33 (H) 0.61 - 1.24 mg/dL   Calcium 8.4 (L) 8.9 - 10.3 mg/dL   GFR calc non Af Amer >60 >60 mL/min   GFR calc Af Amer >60 >60 mL/min    Comment: (NOTE) The eGFR has been calculated using the CKD EPI equation. This calculation has not been validated in all clinical situations. eGFR's persistently <60 mL/min signify possible Chronic Kidney Disease.    Anion gap 10 5 - 15    Comment: Performed at [facility], [place] [address]  [place], [place] [address]  Urinalysis, Routine w reflex microscopic     Status: Abnormal   Collection Time: 06/25/17 11:17 AM  Result Value Ref Range   Color, Urine YELLOW YELLOW   APPearance CLEAR CLEAR   Specific  Gravity, Urine 1.008 1.005 - 1.030   pH 6.0 5.0 - 8.0   Glucose, UA NEGATIVE NEGATIVE mg/dL   Hgb urine dipstick SMALL (A) NEGATIVE   Bilirubin Urine NEGATIVE NEGATIVE   Ketones, ur NEGATIVE NEGATIVE mg/dL   Protein, ur NEGATIVE NEGATIVE mg/dL   Nitrite NEGATIVE NEGATIVE   Leukocytes, UA SMALL (A) NEGATIVE   RBC / HPF 0-5 0 - 5 RBC/hpf   WBC, UA 6-30 0 - 5 WBC/hpf   Bacteria, UA FEW (A) NONE SEEN   Squamous Epithelial / LPF NONE SEEN NONE SEEN    Comment: Performed at [facility], [place] [address], [place], [place] [address]  Culture, blood (routine x 2)     Status: None (Preliminary result)   Collection Time: 06/25/17 11:48 AM  Result Value Ref Range   Specimen Description      RIGHT ANTECUBITAL Performed at [facility] [place] [address], [place], [place] [address]    Special Requests      BOTTLES DRAWN AEROBIC AND ANAEROBIC Blood Culture adequate volume Performed at [place] [address], [place], [place] [address]    Culture      NO GROWTH < 24 HOURS Performed at [place] [address] [place], [place] [address]    Report Status PENDING   Culture, blood (routine x 2)     Status: None (Preliminary result)   Collection Time: 06/25/17 11:54 AM  Result Value Ref Range   Specimen Description      RIGHT ANTECUBITAL Performed at [place] [address] [place], [place] [address]    Special Requests      BOTTLES DRAWN AEROBIC AND ANAEROBIC Blood Culture adequate volume Performed at [place] [address] [place], [place] [address]    Culture      NO GROWTH < 24 HOURS Performed at [place] [address] [place], [place] [address]     Report Status PENDING   Lactic acid, plasma     Status: None   Collection Time: 06/25/17  5:50 PM  Result Value Ref Range   Lactic Acid, Venous 0.9 0.5 - 1.9 mmol/L    Comment: Performed at [facility], [place] [address] [place], [place] [address]  Creatinine, serum     Status: None   Collection Time: 06/26/17  6:43 AM  Result Value Ref Range   Creatinine, Ser 1.15 0.61 - 1.24 mg/dL   GFR calc non Af Amer >60 >60 mL/min   GFR calc Af Amer >60 >60 mL/min    Comment: (NOTE) The eGFR has been calculated using the CKD EPI equation. This calculation has not been validated in all clinical situations. eGFR's persistently <60 mL/min signify possible Chronic Kidney Disease. Performed at [facility], [place] [address], [place], [place] [address]     Dg Chest Port 1 View  Result Date: 06/26/2017 CLINICAL DATA:  Fever EXAM: PORTABLE CHEST 1 VIEW COMPARISON:  06/22/2017 FINDINGS: The heart size and mediastinal contours are within normal limits. Both lungs are clear. Minimal atelectasis or scarring is seen at the left lung base. Right-sided port catheter is noted with tip at the cavoatrial juncture. The visualized skeletal structures are unremarkable. IMPRESSION: No active disease. Minimal atelectasis and/or scarring at the left lung base. Port catheter tip at the cavoatrial junction. Electronically Signed   By: Ashley Royalty M.D.   On: 06/26/2017 00:04    Review  of Systems  Constitutional: Positive for chills, fever and malaise/fatigue.  Respiratory: Negative for cough, hemoptysis, sputum production and shortness of breath.   Cardiovascular: Negative for chest pain.   Blood pressure 122/71, pulse (!) 101, temperature 99.8 F (37.7 C), temperature source Oral, resp. rate 20, height 5' 9"  (1.753 m), weight 92.5 kg (204 lb), SpO2 99 %. Physical Exam  Constitutional: He is oriented to person, place, and time. He appears well-developed and well-nourished. No  distress.  HENT:  Head: Normocephalic and atraumatic.  Eyes: EOM are normal. Pupils are equal, round, and reactive to light. Right eye exhibits no discharge. Left eye exhibits no discharge.  Neck: Normal range of motion. No tracheal deviation present. No thyromegaly present.  Cardiovascular: Normal rate and regular rhythm. Exam reveals no gallop and no friction rub.  No murmur heard. Respiratory: Effort normal and breath sounds normal. No stridor. No respiratory distress. He has no wheezes. He has no rales. He exhibits no tenderness.  GI: Soft. Bowel sounds are normal. He exhibits no distension. There is no tenderness. There is no rebound.  Genitourinary:  Genitourinary Comments: SCC of penis.   Musculoskeletal: He exhibits edema (LLE).  Neurological: He is oriented to person, place, and time.  Skin: Skin is warm and dry. He is not diaphoretic.  Suprapubic wound that measures  ~2.5 x 5 cm, cm of tunneling noted inferiorly towards the 6'oclock position. Surrounding induration present. Light palpation elicits SS drainage, no purulence or active bleeding.    Assessment/Plan: Suprapubic wound  s/p radiation 04/2017 and s/p lymph node biopsy 06/20/17 - draining serous fluid; no role for surgical debridement  - recommend daily loose packing with 1/4" or 1/2" iodoform gauze to act as a wick. Cover with dry dressing and change as needed. Clean skin daily with mild soap and water.  - agree with empiric abx and follow cultures.  - we will follow   UTI Penile cancer HTN AKI, CKD III Anemia of chronic disease   Ronald Wilson, [facility] Surgery Pager: [phone] Consults: [phone] Mon-Fri 7:00 am-4:30 pm Sat-Sun 7:00 am-11:30 am

## 2017-06-26 NOTE — Consult Note (Signed)
Consultation Note Date: 06/26/2017   Patient Name: Ronald Wilson  DOB: 1976-01-10  MRN: [id number]  Age / Sex: 42 y.o., male  PCP: [name], FNP Referring Physician: Kayleen Memos, DO  Reason for Consultation: Pain control  HPI/Patient Profile: 43 y.o. male  with past medical history of squamous cell carcinoma of the penis with bulky metastatic lymphadenopathy (s/p biopsy on 3/12 for molecular testing for salvage immunotherapy) that has been refractory to chemotherapy and radiation therapy admitted on 06/22/2017 with fever, leukocytosis, anemia. Workup reveals SIRS. He has been noted to have a large suprapubic mass (was thought to be an inguinal lymph node) that ruptured last night with diffuse serosanguinous drainage. Surgery and wound care have been consulted.  Palliative medicine was consulted for pain management.    Clinical Assessment and Goals of Care: Patient evaluated. Chart reviewed.   Noted patient with much less use of pain medication today. Patient reports great pain relief since his mass ruptured last night and has been expressed by wound care. States pain is controlled by current po pain medication.   Conducted life review. He lives in [place]. Cares for his father who is wheelchair bound. Has a brother who had a stroke and lives at [facility]. Has no other social support.   He was under the impression initially his chemo was with curative intent, but he tells me "it hasn't gone as planned".   If he was not decisional his father would be his Media planner.   We discussed the purpose of the last biopsy- gently touched on plans for "what if?" if biopsy does not show options for treatment- patient seemed unaware this was a possibility.   Visit today was primarily focused on support and symptom management. Code status, advanced care planning will continue to be discussed.   Primary  Decision Maker PATIENT    SUMMARY OF RECOMMENDATIONS -Continue current care -Full scope treatment -PMT will continue to visit and address [place], today's visit was primarily focused on symptom assessment/management and rapport building    Code Status/Advance Care Planning:  Full code    Symptom Management:   Continue pain management as ordered  Palliative Prophylaxis:   Frequent Pain Assessment  Additional Recommendations (Limitations, Scope, Preferences):  Full Scope Treatment  Prognosis:    Unable to determine  Discharge Planning: Home  Primary Diagnoses: Present on Admission: . (Resolved) SIRS (systemic inflammatory response syndrome) (HCC) . Penile cancer ([place]) . Sepsis ([place])   I have reviewed the medical record, interviewed the patient and family, and examined the patient. The following aspects are pertinent.  Past Medical History:  Diagnosis Date  . Hypertension   . Inguinal mass 11/17/2016   left  . Sickle cell trait ([place])   . squamous cell penile ca dx'd 11/2015   Social History   Socioeconomic History  . Marital status: Single    Spouse name: None  . Number of children: None  . Years of education: None  . Highest education level: None  Social Needs  . Financial  resource strain: None  . Food insecurity - worry: None  . Food insecurity - inability: None  . Transportation needs - medical: None  . Transportation needs - non-medical: None  Occupational History  . None  Tobacco Use  . Smoking status: Current Every Day Smoker    Packs/day: 0.25    Years: 25.00    Pack years: 6.25    Types: Cigarettes  . Smokeless tobacco: Never Used  Substance and Sexual Activity  . Alcohol use: Yes    Alcohol/week: 9.0 oz    Types: 15 Shots of liquor per week    Comment: "11/17/2016 "I'll have shots ~ 3 days/wk"  . Drug use: Yes    Types: Marijuana    Comment: 11/17/2016 "daily"  . Sexual activity: Yes    Birth control/protection: Condom  Other Topics  Concern  . None  Social History Narrative   Resides in [place]. 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 Reports he and his father live together.    Family History  Problem Relation Age of Onset  . Hypertension Mother   . Cancer Maternal Aunt        unknown   Scheduled Meds: . amLODipine  10 mg Oral Daily   Continuous Infusions: . sodium chloride 50 mL/hr at 06/25/17 1746  . piperacillin-tazobactam (ZOSYN)  IV Stopped (06/26/17 1359)  . vancomycin     PRN Meds:.acetaminophen **OR** acetaminophen, ibuprofen, lip balm, morphine injection, ondansetron **OR** ondansetron (ZOFRAN) IV, oxyCODONE-acetaminophen Medications Prior to Admission:  Prior to Admission medications   Medication Sig Start Date End Date Taking? Authorizing Provider  amLODipine (NORVASC) 10 MG tablet Take 1 tablet (10 mg total) by mouth daily. 05/30/17  Yes Wyatt Portela, MD  lidocaine (XYLOCAINE) 2 % jelly Apply 1 application topically as needed. 05/24/17  Yes Bruning, Ashlyn, PA-C  lidocaine-prilocaine (EMLA) cream Apply 1 application topically as needed. Apply to porta cath before chemotherapy. 12/16/16  Yes Wyatt Portela, MD  oxyCODONE-acetaminophen (PERCOCET/ROXICET) 5-325 MG tablet Take 1 tablet by mouth every 4 (four) hours as needed for severe pain. 06/13/17  Yes Wyatt Portela, MD  prochlorperazine (COMPAZINE) 10 MG tablet Take 1 tablet (10 mg total) by mouth every 6 (six) hours as needed for nausea or vomiting. 12/16/16  Yes Wyatt Portela, MD  Rivaroxaban 15 & 20 MG TBPK Take as directed on package: One 15mg  tablet by mouth twice a day with food for 3 weeks, then one 20mg  tablet once a day with food. 05/29/17  Yes Bruning, Ashlyn, PA-C  enoxaparin (LOVENOX) 30 MG/0.3ML injection Inject 1 mL (100 mg total) into the skin every 12 (twelve) hours for 9 doses. 05/24/17 05/29/17  Bruning, Ashlyn, PA-C   No Known Allergies Review of Systems  Constitutional: Positive for activity change and fatigue.  Respiratory: Negative  for shortness of breath.   Psychiatric/Behavioral: Negative for sleep disturbance.    Physical Exam  Constitutional: He is oriented to person, place, and time. He appears well-developed and well-nourished.  Cardiovascular: Normal rate.  Pulmonary/Chest: Effort normal.  Musculoskeletal: Normal range of motion. He exhibits edema (LUE).  Neurological: He is alert and oriented to person, place, and time.  Skin: Skin is warm and dry.  Open wound to L suprapubic area, expels air and serosanguinous fluid when expressed  Nursing note and vitals reviewed.   Vital Signs: BP 122/71 (BP Location: Right Arm)   Pulse (!) 101   Temp 99.8 F (37.7 C) (Oral)   Resp 20   Ht 5\' 9"  (  1.753 m)   Wt 92.5 kg (204 lb)   SpO2 99%   BMI 30.13 kg/m  Pain Assessment: No/denies pain   Pain Score: 0-No pain   SpO2: SpO2: 99 % O2 Device:SpO2: 99 % O2 Flow Rate: .   IO: Intake/output summary:   Intake/Output Summary (Last 24 hours) at 06/26/2017 1528 Last data filed at 06/26/2017 1437 Gross per 24 hour  Intake 1540 ml  Output -  Net 1540 ml    LBM: Last BM Date: 06/21/17 Baseline Weight: Weight: 92.5 kg (204 lb) Most recent weight: Weight: 92.5 kg (204 lb)     Palliative Assessment/Data: PPS: 70%     Thank you for this consult. Palliative medicine will continue to follow and assist as needed.   Time In: 1430 Time Out: 1555 Time Total: 85 mins Prolong services billed: Yes Greater than 50%  of this time was spent counseling and coordinating care related to the above assessment and plan.  Signed by: Mariana Kaufman, AGNP-C Palliative Medicine    Please contact Palliative Medicine Team phone at [phone] for questions and concerns.  For individual provider: See Shea Evans

## 2017-06-26 NOTE — Progress Notes (Signed)
Events in the last few days noted.  The patient is clinically stable with a hemoglobin of 7.8 noted on June 25, 2017.  The results of his biopsy obtained on 06/20/2017 is pending and further molecular testing as well as PDL 1 testing will likely take 2 weeks before the final results.  Results of this testing will determine whether additional immunotherapy would be useful in this setting.   I recommend continued supportive oncology care will be determined in the future.  His prognosis is guarded given advanced malignancy that is chemotherapy refractory at this time.  We will continue to follow with you.

## 2017-06-27 DIAGNOSIS — Z515 Encounter for palliative care: Secondary | ICD-10-CM

## 2017-06-27 DIAGNOSIS — R5081 Fever presenting with conditions classified elsewhere: Secondary | ICD-10-CM

## 2017-06-27 DIAGNOSIS — I10 Essential (primary) hypertension: Secondary | ICD-10-CM

## 2017-06-27 LAB — CREATININE, SERUM
Creatinine, Ser: 1.18 mg/dL (ref 0.61–1.24)
GFR calc non Af Amer: >60 mL/min (ref >60)

## 2017-06-27 LAB — PREPARE RBC (CROSSMATCH)

## 2017-06-27 LAB — CULTURE, BLOOD (ROUTINE X 2)
Culture: NO GROWTH
Culture: NO GROWTH
Special Requests: ADEQUATE
Special Requests: ADEQUATE

## 2017-06-27 LAB — URINE CULTURE: CULTURE: NO GROWTH

## 2017-06-27 LAB — CBC
HEMATOCRIT: 21.8 % — AB (ref 39.0–52.0)
HEMOGLOBIN: 7.2 g/dL — AB (ref 13.0–17.0)
MCH: 27.7 pg (ref 26.0–34.0)
MCHC: 33 g/dL (ref 30.0–36.0)
MCV: 83.8 fL (ref 78.0–100.0)
Platelets: 404 10*3/uL — ABNORMAL HIGH (ref 150–400)
RBC: 2.6 MIL/uL — ABNORMAL LOW (ref 4.22–5.81)
RDW: 17.5 % — AB (ref 11.5–15.5)
WBC: 15.4 10*3/uL — ABNORMAL HIGH (ref 4.0–10.5)

## 2017-06-27 MED ORDER — SODIUM CHLORIDE 0.9 % IV SOLN: Freq: Once | INTRAVENOUS | Status: DC

## 2017-06-27 MED ORDER — CEPHALEXIN 500 MG PO CAPS
500.0000 mg | ORAL_CAPSULE | Freq: Four times a day (QID) | ORAL | Status: DC
  Administered 2017-06-27 – 2017-06-28 (×5): 500 mg via ORAL
  Filled 2017-06-27 (×5): qty 1

## 2017-06-27 MED ORDER — DOXYCYCLINE HYCLATE 100 MG PO TABS
100.0000 mg | ORAL_TABLET | Freq: Two times a day (BID) | ORAL | Status: DC
  Administered 2017-06-27 – 2017-06-28 (×3): 100 mg via ORAL
  Filled 2017-06-27 (×3): qty 1

## 2017-06-27 NOTE — Progress Notes (Signed)
CC: fever, tachycardia  Subjective: No real change, stable, eating well and says he's voiding allot.  Objective: Vital signs in last 24 hours: Temp:  [98.3 F (36.8 C)-99.8 F (37.7 C)] 98.5 F (36.9 C) (03/19 0546) Pulse Rate:  [86-103] 86 (03/19 0546) Resp:  [18-20] 18 (03/19 0546) BP: (110-138)/(71-96) 110/96 (03/19 0546) SpO2:  [97 %-99 %] 97 % (03/19 0546) Last BM Date: 06/21/17 720 PO 2400 IV Urine x 5 Stool x 3 Afebrile, tachycardia is better, BP stable WBC improving WBC 15.4 H/H 7.2/21.8 Intake/Output from previous day: 03/18 0701 - 03/19 0700 In: 2931.7 [P.O.:720; I.V.:1711.7; IV Piggyback:500] Out: -  Intake/Output this shift: No intake/output data recorded.  General appearance: alert, cooperative and no distress Skin: open site with ongoing drainage of lymph.  he changed the dressing ABD last PM.  The current one is also soaked.    Lab Results:  Recent Labs    06/25/17 0625  WBC 21.6*  HGB 7.8*  HCT 23.9*  PLT 432*    BMET Recent Labs    06/25/17 0625 06/26/17 0643 06/27/17 0500  NA 135  --   --   K 3.5  --   --   CL 101  --   --   CO2 24  --   --   GLUCOSE 132*  --   --   BUN 11  --   --   CREATININE 1.33* 1.15 1.18  CALCIUM 8.4*  --   --    PT/INR No results for input(s): LABPROT, INR in the last 72 hours.  Recent Labs  Lab 06/20/17 1120 06/22/17 0911  AST 16 14  ALT 13* 12  ALKPHOS 65 66  BILITOT 0.5 0.3  PROT 7.1 6.6  ALBUMIN 3.2* 2.7*     Lipase  No results found for: LIPASE   Medications: . amLODipine  10 mg Oral Daily   . sodium chloride 50 mL/hr at 06/25/17 1746  . piperacillin-tazobactam (ZOSYN)  IV Stopped (06/27/17 0545)  . vancomycin Stopped (06/26/17 2230)   Anti-infectives (From admission, onward)   Start     Dose/Rate Route Frequency Ordered Stop   06/26/17 2000  vancomycin (VANCOCIN) 1,250 mg in sodium chloride 0.9 % 250 mL IVPB     1,250 mg 166.7 mL/hr over 90 Minutes Intravenous Every 24 hours  06/25/17 1749     06/25/17 1800  vancomycin (VANCOCIN) 2,000 mg in sodium chloride 0.9 % 500 mL IVPB     2,000 mg 250 mL/hr over 120 Minutes Intravenous  Once 06/25/17 1749 06/25/17 2026   06/25/17 1800  piperacillin-tazobactam (ZOSYN) IVPB 3.375 g     3.375 g 12.5 mL/hr over 240 Minutes Intravenous Every 8 hours 06/25/17 1749     06/24/17 1600  cefTRIAXone (ROCEPHIN) 1 g in sodium chloride 0.9 % 100 mL IVPB  Status:  Discontinued     1 g 200 mL/hr over 30 Minutes Intravenous Every 24 hours 06/24/17 1310 06/25/17 1726   06/23/17 1400  cefTRIAXone (ROCEPHIN) 1 g in sodium chloride 0.9 % 100 mL IVPB  Status:  Discontinued     1 g 200 mL/hr over 30 Minutes Intravenous Every 24 hours 06/22/17 2215 06/23/17 1435   06/22/17 1330  cefTRIAXone (ROCEPHIN) 1 g in sodium chloride 0.9 % 100 mL IVPB     1 g 200 mL/hr over 30 Minutes Intravenous  Once 06/22/17 1326 06/22/17 1446     Assessment/Plan Metastaticsquamous cell penile cancer with bulky metastatic lymphadenopathy  Hypertension  Hx of Sickle cell trait Anemia   Suprapubic open wound  - s/p radiation 04/2017 and s/p lymph node biopsy 06/20/17 - draining serous fluid; no role for surgical debridement  - recommend daily loose packing with 1/4" or 1/2" iodoform gauze to act as a wick. Cover with dry dressing and change as needed. Clean skin daily with mild soap and water.  - agree with empiric abx and follow cultures.  - we will follow   FEN: IV fluids/regular diet ID:  Rocephin 3/14-3/16;  Zosyn 3/17=>> day 3;  Vancomycin 3/17 =>> day 3 DVT:  SCD Foley:  None Follow up:  To be determined  Plan:  Iodoform wicking of the site and clean dressings as needed.  We will replace the wicking daily and will teach nursing staff how to do this. We will do wicking when nurse has supplies and pt has had some pain medication for the dressing change.    LOS: 4 days    Ronald Wilson 06/27/2017 [phone]

## 2017-06-27 NOTE — Progress Notes (Signed)
Events noted in the last few days.  He continues to have issues with bleeding around the wound as well as the penis.  I recommended discontinuation of Xarelto at this time.  IVC filter placement is a possibility but given the fact that he has upper extremity thrombosis, IVC filter will probably not prevent any future clots.  I recommend holding Xarelto at this time, it is reasonable to discharge home on oral antibiotics and will arrange follow-up at the [facility]  in the near future.

## 2017-06-27 NOTE — Progress Notes (Signed)
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  06/26/17: Patient seen and examined at his bedside.  Reports wounds suprapubic wound has been bleeding all night.  Wound care specialist asked to see.  Recommends surgical evaluation.  Febrile overnight with T-max of 102.4 with leukocytosis WBC 21 from 06/25/17.  06/27/2017: Patient seen and examined in his bedside.  He is inquiring about going home.  Continues to have serous sanguinous drainage from his suprapubic wound.  General surgery has no plan for I&D.  Hemoglobin continues to trend down to 7.2.  IVC filter ordered for placement.  Patient has no new complaints.  Assessment/Plan: Principal Problem:   Sepsis secondary to UTI [facility]) Active Problems:   Penile cancer ([place])   Anemia associated with chemotherapy   DVT (deep venous thrombosis) ([place])   Sepsis ([place])   Palliative care by specialist  Sepsis 2/2 to suspected suprapubic wound Wound culture ordered and pending General surgery consulted no plan for I&D Completed IV vancomycin and IV Zosyn Started p.o. doxycycline and p.o. Keflex on 06/27/2017  Penile cancer Oncology following Last radiation was January 2019 Urology paged. Awaiting call back. Biopsy 06/20/17 of cystic lymph node within lymph node Denies difficulties with urination Spoke with the patient oncologist Dr. Alen Blew on 06/27/2017 who agrees with stopping Xarelto and have an IVC filter placed in.  He also agrees with discharging the patient on oral antibiotics.  He will schedule for  follow-up appointment next week.  Left upper extremity DVT  Xarelto being held due to acute bleeding from suprapubic wound IVC filter placement ordered on 06/27/2017  Hypertension Blood pressure well-controlled Continue amlodipine 10 mg daily  AK I on CKD 3 Creatinine on presentation 1.49 Creatinine 1.18 on 06/27/2017 from 1.19. Avoid nephrotoxic agents hypotension dehydration Repeat BMP in the morning  Anemia chronic disease complicated by acute blood loss Chronic disease from penile cancer Acute blood loss from suprapubic wound Hemoglobin 7.2 on 06/27/2017 from 7.8 Transfuse today due to acute blood loss 2 units PRBCs Continue to monitor CBC   Code Status: Full  Family Communication: None at bedside  Disposition Plan: Home  When hemodynamically stable.   Consultants: General surgery Curbsided oncology Dr. Alen Blew 06/27/17  Procedures:  None   Antimicrobials:  Completed IV Vancomycin and IV zosyn on 06/27/2017  Started p.o. doxycycline p.o. Keflex on 06/27/2017  DVT prophylaxis:  SCDs, Xarelto being held due to acute bleeding   Objective: Vitals:   06/26/17 0430 06/26/17 1133 06/26/17 2142 06/27/17 0546  BP: 133/74 122/71 138/87 (!) 110/96  Pulse: (!) 118 (!) 101 (!) 103 86  Resp: 20 20 20 18   Temp: 99 F (37.2 C) 99.8 F (37.7 C) 98.3 F (36.8 C) 98.5 F (36.9 C)  TempSrc: Oral Oral Oral Oral  SpO2: 98% 99% 97% 97%  Weight:      Height:        Intake/Output Summary (Last 24 hours) at 06/27/2017 1311 Last data filed at 06/27/2017 1033 Gross per 24 hour  Intake 2779.16 ml  Output -  Net 2779.16 ml   [employer]   06/22/17  1139  Weight: 92.5 kg (204 lb)    Exam: 06/27/2017.  Patient seen and examined at his bedside.  Physical exam is essentially unchanged from prior except for what is mentioned below.   General: 42 year old male well-developed well-nourished in no acute distress alert and oriented x3   Cardiovascular: RRR no rubs or gallops     Respiratory: Clear to auscultation with no wheezes or rales Abdomen: soft NT ND NBS x4 suprapubic wound noted with serosanguineous drainage.  Musculoskeletal: Non focal  Skin: end of penis with lesions; suprapubic lesion with malodorous serosanguineous drainage  Psychiatry: Mood is appropriate    Data Reviewed: CBC: Recent Labs  Lab 06/22/17 0911 06/23/17 0630 06/24/17 0500 06/24/17 0613 06/25/17 0625 06/27/17 0500  WBC 20.9* 20.6* 29.0* 21.6* 21.6* 15.4*  NEUTROABS 18.9*  --   --  19.4*  --   --   HGB 6.3* 7.6* 3.5* 7.5* 7.8* 7.2*  HCT 19.8* 22.4* 10.3* 22.5* 23.9* 21.8*  MCV 83.9 83.3 84.4 84.0 83.3 83.8  PLT 405* 354 464* 369 432* 834*   Basic Metabolic Panel: Recent Labs  Lab 06/22/17 0911 06/23/17 0630 06/24/17 0500 06/25/17 0625 06/26/17 0643 06/27/17 0500  NA 136 138 139 135  --   --   K 3.9 3.9 3.6 3.5  --   --   CL 103 105 104 101  --   --   CO2 26 26 25 24   --   --   GLUCOSE 112 110* 110* 132*  --   --   BUN 9 8 11 11   --   --   CREATININE 1.44* 1.24 1.19 1.33* 1.15 1.18  CALCIUM 9.0 8.5* 8.3* 8.4*  --   --    GFR: Estimated Creatinine Clearance: 92.5 mL/min (by C-G formula based on SCr of 1.18 mg/dL). Liver Function Tests: Recent Labs  Lab 06/22/17 0911  AST 14  ALT 12  ALKPHOS 66  BILITOT 0.3  PROT 6.6  ALBUMIN 2.7*   No results for input(s): LIPASE, AMYLASE in the last 168 hours. No results for input(s): AMMONIA in the last 168 hours. Coagulation Profile: No results for input(s): INR, PROTIME in the last 168 hours. Cardiac Enzymes: No results for input(s): CKTOTAL, CKMB, CKMBINDEX, TROPONINI in the last 168 hours. BNP (last 3 results) No results for input(s): PROBNP in the last 8760 hours. HbA1C: No results for input(s): HGBA1C in the last 72 hours. CBG: No results for input(s): GLUCAP in the last 168 hours. Lipid Profile: No results for input(s): CHOL, HDL, LDLCALC, TRIG, CHOLHDL, LDLDIRECT in the last 72 hours. Thyroid Function  Tests: No results for input(s): TSH, T4TOTAL, FREET4, T3FREE, THYROIDAB in the last 72 hours. Anemia Panel: No results for input(s): VITAMINB12, FOLATE, FERRITIN, TIBC, IRON, RETICCTPCT in the last 72 hours. Urine analysis:    Component Value Date/Time   COLORURINE YELLOW 06/25/2017 1117   APPEARANCEUR CLEAR 06/25/2017 1117   LABSPEC 1.008 06/25/2017 1117   PHURINE 6.0 06/25/2017 1117   GLUCOSEU NEGATIVE 06/25/2017 1117   HGBUR SMALL (A) 06/25/2017 1117   BILIRUBINUR NEGATIVE 06/25/2017 1117   [place] 06/25/2017 1117   PROTEINUR NEGATIVE 06/25/2017 1117   NITRITE NEGATIVE 06/25/2017 1117   LEUKOCYTESUR SMALL (A) 06/25/2017 1117   Sepsis Labs: @LABRCNTIP (procalcitonin:4,lacticidven:4)  ) Recent Results (from the past 240 hour(s))  Culture, blood (routine x 2)     Status: None   Collection Time: 06/22/17 12:35 PM  Result Value Ref Range Status   Specimen Description   Final  BLOOD RIGHT PORTA CATH Performed at [facility], [place] [address], [place], [place] [address]    Special Requests   Final    BOTTLES DRAWN AEROBIC AND ANAEROBIC Blood Culture adequate volume Performed at [place] [address], [place], [place] [address]    Culture   Final    NO GROWTH 5 DAYS Performed at [facility], [place] [address], [place], [place] [address]    Report Status 06/27/2017 FINAL  Final  Urine culture     Status: Abnormal   Collection Time: 06/22/17 12:35 PM  Result Value Ref Range Status   Specimen Description   Final    URINE, CLEAN CATCH Performed at [facility], [place] [address] [place], [place] [address]    Special Requests   Final    NONE Performed at [facility], [place] [address] [place], [place] [address]    Culture MULTIPLE SPECIES PRESENT, SUGGEST RECOLLECTION (A)  Final   Report Status 06/23/2017 FINAL  Final  Culture, blood (routine x 2)     Status: None   Collection  Time: 06/22/17  1:55 PM  Result Value Ref Range Status   Specimen Description   Final    BLOOD RIGHT ANTECUBITAL Performed at [facility] [address], [place], [place] [address]    Special Requests   Final    BOTTLES DRAWN AEROBIC AND ANAEROBIC Blood Culture adequate volume Performed at [place] [address], [place], [place] [address]    Culture   Final    NO GROWTH 5 DAYS Performed at [facility], [place] [address] [place], [place] [address]    Report Status 06/27/2017 FINAL  Final  Culture, Urine     Status: Abnormal   Collection Time: 06/24/17  5:20 AM  Result Value Ref Range Status   Specimen Description   Final    URINE, CLEAN CATCH Performed at [facility], [place] [address], [place], [place] [address]    Special Requests   Final    Rocephin Immunocompromised Performed at [facility], [place] [address], [place], [place] [address]    Culture (A)  Final    <10,000 COLONIES/mL INSIGNIFICANT GROWTH Performed at [place] [address], [place], [place] [address]    Report Status 06/25/2017 FINAL  Final  Culture, Urine     Status: None   Collection Time: 06/25/17 11:17 AM  Result Value Ref Range Status   Specimen Description   Final    URINE, RANDOM Performed at [place] [address], [place], [place] [address]    Special Requests   Final    NONE Performed at [facility], [place] [address], [place], [place] [address]    Culture   Final    NO GROWTH Performed at [facility], [place] [address], [place], [place] [address]    Report Status 06/27/2017 FINAL  Final  Culture, blood (routine x 2)     Status: None (Preliminary result)   Collection Time: 06/25/17 11:48 AM  Result Value Ref Range Status   Specimen Description   Final    RIGHT ANTECUBITAL Performed at [place] [address]  [place], [place] [address]    Special Requests   Final    BOTTLES DRAWN AEROBIC AND ANAEROBIC Blood Culture adequate volume Performed at [place]  [place], [place] [address]    Culture   Final    NO GROWTH 2 DAYS Performed at [facility], [place] [address], [place], [place] [address]    Report Status PENDING  Incomplete  Culture, blood (routine x 2)     Status: None (Preliminary result)   Collection Time: 06/25/17 11:54 AM  Result Value Ref Range Status   Specimen Description   Final    RIGHT ANTECUBITAL Performed at [place] [address], [place], [place] [address]    Special Requests   Final    BOTTLES DRAWN AEROBIC AND ANAEROBIC Blood Culture adequate volume Performed at [place] [address], [place], [place] [address]    Culture   Final    NO GROWTH 2 DAYS Performed at [place] [address] [place],  [address]    Report Status PENDING  Incomplete      Studies: No results found.  Scheduled Meds: . amLODipine  10 mg Oral Daily  . cephALEXin  500 mg Oral Q6H  . doxycycline  100 mg Oral Q12H    Continuous Infusions: . sodium chloride 50 mL/hr at 06/25/17 1746     LOS: 4 days     Kayleen Memos, MD [facility] Pager [phone]  If 7PM-7AM, please contact night-coverage [email or web address] Password [email or web address] 06/27/2017, 1:11 PM

## 2017-06-27 NOTE — Progress Notes (Addendum)
IR received request for IVC filter placement. Chart reviewed and case discussed with Dr. Annamaria Boots. LE lymphedema from extensive lymphadenopathy but NO LE DVT noted. Had more recent (L)UE DVT and placed on Xarelto. Unfortunately, now bleeding from suprapubic wound and anticoagulation has to be held.  IVC filter not indicated for Upper Ext DVT, and negative for LE DVT. If pt develops LE DVT while off anticoagulation, IVC filter can then be considered. This was d/w Dr. Vic Ripper PA-C Interventional Radiology 06/27/2017 3:15 PM

## 2017-06-28 DIAGNOSIS — T148XXA Other injury of unspecified body region, initial encounter: Secondary | ICD-10-CM | POA: Diagnosis present

## 2017-06-28 LAB — BASIC METABOLIC PANEL
Anion gap: 8 (ref 5–15)
BUN: 12 mg/dL (ref 6–20)
CHLORIDE: 105 mmol/L (ref 101–111)
CO2: 25 mmol/L (ref 22–32)
CREATININE: 1.09 mg/dL (ref 0.61–1.24)
Calcium: 8.6 mg/dL — ABNORMAL LOW (ref 8.9–10.3)
GFR calc Af Amer: >60 mL/min (ref >60)
GFR calc non Af Amer: >60 mL/min (ref >60)
GLUCOSE: 119 mg/dL — AB (ref 65–99)
Potassium: 3.6 mmol/L (ref 3.5–5.1)
Sodium: 138 mmol/L (ref 135–145)

## 2017-06-28 LAB — CBC
HEMATOCRIT: 29.1 % — AB (ref 39.0–52.0)
HEMOGLOBIN: 9.4 g/dL — AB (ref 13.0–17.0)
MCH: 27.3 pg (ref 26.0–34.0)
MCHC: 32.3 g/dL (ref 30.0–36.0)
MCV: 84.6 fL (ref 78.0–100.0)
Platelets: 511 10*3/uL — ABNORMAL HIGH (ref 150–400)
RBC: 3.44 MIL/uL — ABNORMAL LOW (ref 4.22–5.81)
RDW: 16.6 % — AB (ref 11.5–15.5)
WBC: 14.1 10*3/uL — ABNORMAL HIGH (ref 4.0–10.5)

## 2017-06-28 MED ORDER — CEPHALEXIN 500 MG PO CAPS: 500.0000 mg | ORAL_CAPSULE | Freq: Four times a day (QID) | ORAL | 0 refills | Status: AC

## 2017-06-28 MED ORDER — DOXYCYCLINE HYCLATE 100 MG PO TABS: 100.0000 mg | ORAL_TABLET | Freq: Two times a day (BID) | ORAL | 0 refills | Status: AC

## 2017-06-28 MED ORDER — HEPARIN SOD (PORK) LOCK FLUSH 100 UNIT/ML IV SOLN
500.0000 [IU] | Freq: Once | INTRAVENOUS | Status: AC
  Administered 2017-06-28: 500 [IU] via INTRAVENOUS
  Filled 2017-06-28: qty 5

## 2017-06-28 NOTE — Progress Notes (Signed)
    JI:RCVEL, tachycardia  Subjective: No real change, a little less drainage this AM than yesterday. No cellulitis.  Objective: Vital signs in last 24 hours: Temp:  [98.1 F (36.7 C)-99.5 F (37.5 C)] 98.1 F (36.7 C) (03/20 0300) Pulse Rate:  [88-109] 102 (03/20 0300) Resp:  [16-20] 18 (03/20 0300) BP: (125-136)/(74-83) 129/79 (03/20 0300) SpO2:  [100 %] 100 % (03/20 0300) Last BM Date: 06/21/17 480 PO 1850 IV Urine x 6 BM x 1 Afebrile, VSS Labs OK WBC still trending down Transfused yesterday    Intake/Output from previous day: 03/19 0701 - 03/20 0700 In: 2330 [P.O.:480; I.V.:1850] Out: -  Intake/Output this shift: No intake/output data recorded.  General appearance: alert, cooperative and no distress Skin: open area draining and no real change.    Lab Results:  Recent Labs    06/27/17 0500 06/28/17 0536  WBC 15.4* 14.1*  HGB 7.2* 9.4*  HCT 21.8* 29.1*  PLT 404* 511*    BMET Recent Labs    06/27/17 0500 06/28/17 0536  NA  --  138  K  --  3.6  CL  --  105  CO2  --  25  GLUCOSE  --  119*  BUN  --  12  CREATININE 1.18 1.09  CALCIUM  --  8.6*   PT/INR No results for input(s): LABPROT, INR in the last 72 hours.  Recent Labs  Lab 06/22/17 0911  AST 14  ALT 12  ALKPHOS 66  BILITOT 0.3  PROT 6.6  ALBUMIN 2.7*     Lipase  No results found for: LIPASE   Medications: . amLODipine  10 mg Oral Daily  . cephALEXin  500 mg Oral Q6H  . doxycycline  100 mg Oral Q12H    Assessment/Plan Metastaticsquamous cell penile cancer with bulky metastatic lymphadenopathy  Hypertension Hx of Sickle cell trait Anemia   Suprapubic open wound  - s/p radiation 04/2017 and s/p lymph node biopsy 06/20/17 - draining serous fluid; no role for surgical debridement  - recommend daily loose packing with 1/4" or 1/2" iodoform gauze to act as a wick. Cover with dry dressing and change as needed. Clean skin daily with mild soap and water.  - agree with empiric  abx and follow cultures.  - we will follow   FEN: IV fluids/regular diet ID:  Rocephin 3/14-3/16;  Zosyn 3/17=>> day 3;  Vancomycin 3/17 =>> day 3 DVT:  SCD Foley:  None Follow up: Oncology/Radiation oncology/Wound Center  Plan:  Nursing will continue to place Iodoform wicking daily, continue to clean site with soap and water daily at least twice a day, more if needed.  He can follow up with Radiation oncology/Oncology/ and wound care center.  We cannot add more to his care at this point.             LOS: 5 days    Ronald Wilson 06/28/2017 [phone]

## 2017-06-28 NOTE — Discharge Summary (Signed)
[facility]  Physician Discharge Summary   Patient ID: Ronald Wilson MRN: [id number] DOB/AGE: May 26, 1975 42 y.o.  Admit date: 06/22/2017 Discharge date: 06/28/2017  PCP: [name], FNP  DISCHARGE DIAGNOSES:  Principal Problem:   Sepsis secondary to UTI [facility]) Active Problems:   Penile cancer ([place])   Anemia associated with chemotherapy   DVT (deep venous thrombosis) (HCC)   Sepsis ([place])   Palliative care by specialist   Wound, open   RECOMMENDATIONS FOR OUTPATIENT FOLLOW UP: 1. Referral sent to the wound care center. 2. Outpatient follow-up with his oncologist, Dr. 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  Consultations:  General surgery  Oncology  Procedures:  None   HOSPITAL COURSE:   Sepsis 2/2 to suspected suprapubic wound/with bleeding Wound cultures were ordered but does not appear like it was done.  Blood cultures were negative.  Patient was initially placed on vancomycin and Zosyn.  Then transition to oral doxycycline and Keflex which will be continued for 7 more days.  Signs of infection have improved.  WBC continues to improve.  Bleeding has subsided.  Patient was seen by general surgery.  No role for debridement.  Outpatient follow-up with wound care center.  Referral has been sent.  Wound care instructions provided.  Penile cancer Followed by oncology.  Last radiation was January 2019. Biopsy 06/20/17 of cystic lymph node within lymph node.  Squamous cell carcinoma noted.   Further management per oncology.  Patient denies any problems with urination.  Patient to follow-up with his oncologist who will schedule follow-up appointment.  Left upper extremity DVT Xarelto had to be held due to acute bleeding in the suprapubic wound causing significant anemia.   Vision required blood transfusion.  Initially IVC filter was considered but the DVTs in the upper extremity.  No role for IVC filter.  Discussed with oncology who recommends stopping anticoagulation for now.  Further discussion as outpatient.    Essential hypertension Blood pressure well-controlled Continue amlodipine 10 mg daily  AKI on CKD 3 Creatinine on presentation 1.49.  Renal function has improved.  Anemia chronic disease complicated by acute blood loss Patient required blood transfusions.  Hemoglobin improved to 9.4.  Bleeding has subsided.  Overall stable.  Patient very keen on going home so that he can take care of his elderly father.  Okay for discharge home today.    PERTINENT LABS:  The results of significant diagnostics from this hospitalization (including imaging, microbiology, ancillary and laboratory) are listed below for reference.    Microbiology: Recent Results (from the past 240 hour(s))  Culture, blood (routine x 2)     Status: None   Collection Time: 06/22/17 12:35 PM  Result Value Ref Range Status   Specimen Description   Final    BLOOD RIGHT PORTA CATH Performed at [facility] [place] [address] [place] [address], [place],  [address]    Special Requests   Final    BOTTLES DRAWN AEROBIC AND ANAEROBIC Blood Culture adequate volume Performed at [place] [address]  [place], [place] [address]    Culture   Final    NO GROWTH 5 DAYS Performed at [facility], [place] [address], [place], [place] [address]    Report Status 06/27/2017 FINAL  Final  Urine culture     Status: Abnormal   Collection Time: 06/22/17 12:35 PM  Result Value Ref  Range Status   Specimen Description   Final    URINE, CLEAN CATCH Performed at [facility], [place] [address], [place], [place] [address]    Special Requests   Final    NONE Performed at [facility], [place] [address] [place], [place] [address]    Culture MULTIPLE SPECIES PRESENT, SUGGEST RECOLLECTION (A)  Final   Report Status 06/23/2017 FINAL  Final  Culture, blood (routine x 2)     Status: None   Collection Time: 06/22/17  1:55 PM  Result Value Ref Range Status   Specimen Description   Final    BLOOD RIGHT ANTECUBITAL Performed at [place] [address], [place], [place] [address]    Special Requests   Final    BOTTLES DRAWN AEROBIC AND ANAEROBIC Blood Culture adequate volume Performed at [place] [address], [place], [place] [address]    Culture   Final    NO GROWTH 5 DAYS Performed at [facility], [place] [address], [place], [place] [address]    Report Status 06/27/2017 FINAL  Final  Culture, Urine     Status: Abnormal   Collection Time: 06/24/17  5:20 AM  Result Value Ref Range Status   Specimen Description   Final    URINE, CLEAN CATCH Performed at [facility], [place] [address] [place], [place] [address]    Special Requests   Final    Rocephin Immunocompromised Performed at [facility], [place] [address] [place], [place] [address]    Culture (A)  Final    <10,000 COLONIES/mL INSIGNIFICANT GROWTH Performed at [place] [address], [place], [place] [address]    Report Status 06/25/2017 FINAL  Final  Culture, Urine     Status: None   Collection Time: 06/25/17 11:17 AM  Result Value Ref Range Status   Specimen Description   Final    URINE, RANDOM Performed at [place] [address], [place], [place] [address]    Special Requests   Final    NONE Performed at [facility], [place]  [address] [place], [place] [address]    Culture   Final    NO GROWTH Performed at [facility], [place] [address], [place], [place] [address]    Report Status 06/27/2017 FINAL  Final  Culture, blood (routine x 2)     Status: None (Preliminary result)   Collection Time: 06/25/17 11:48 AM  Result Value Ref Range Status   Specimen Description   Final    RIGHT ANTECUBITAL Performed at [place] [address], [place], [place] [address]    Special Requests   Final    BOTTLES DRAWN AEROBIC AND ANAEROBIC Blood Culture adequate volume Performed at [place] [address] [place],  [address]    Culture   Final    NO GROWTH 3 DAYS Performed at [facility], [place] [address], [place],  [address]    Report Status PENDING  Incomplete  Culture, blood (routine x 2)  Status: None (Preliminary result)   Collection Time: 06/25/17 11:54 AM  Result Value Ref Range Status   Specimen Description   Final    RIGHT ANTECUBITAL Performed at [place] [address], [place], [place] [address]    Special Requests   Final    BOTTLES DRAWN AEROBIC AND ANAEROBIC Blood Culture adequate volume Performed at [place]  [address] [place], [place] [address]    Culture   Final    NO GROWTH 3 DAYS Performed at [facility], [place] [address] [place], [place] [address]    Report Status PENDING  Incomplete     Labs: Basic Metabolic Panel: Recent Labs  Lab 06/22/17 0911 06/23/17 0630 06/24/17 0500 06/25/17 0625 06/26/17 0643 06/27/17 0500 06/28/17 0536  NA 136 138 139 135  --   --  138  K 3.9 3.9 3.6 3.5  --   --  3.6  CL 103 105 104 101  --   --  105  CO2 26 26 25 24   --   --  25  GLUCOSE 112 110* 110* 132*  --   --  119*  BUN 9 8 11 11   --   --  12  CREATININE 1.44* 1.24 1.19 1.33* 1.15 1.18 1.09  CALCIUM 9.0 8.5* 8.3* 8.4*  --   --  8.6*   Liver Function Tests: Recent  Labs  Lab 06/22/17 0911  AST 14  ALT 12  ALKPHOS 66  BILITOT 0.3  PROT 6.6  ALBUMIN 2.7*   CBC: Recent Labs  Lab 06/22/17 0911  06/24/17 0500 06/24/17 0613 06/25/17 0625 06/27/17 0500 06/28/17 0536  WBC 20.9*   < > 29.0* 21.6* 21.6* 15.4* 14.1*  NEUTROABS 18.9*  --   --  19.4*  --   --   --   HGB 6.3*   < > 3.5* 7.5* 7.8* 7.2* 9.4*  HCT 19.8*   < > 10.3* 22.5* 23.9* 21.8* 29.1*  MCV 83.9   < > 84.4 84.0 83.3 83.8 84.6  PLT 405*   < > 464* 369 432* 404* 511*   < > = values in this interval not displayed.     IMAGING STUDIES Dg Chest 2 View  Result Date: 06/22/2017 CLINICAL DATA:  Fever, just completed chemotherapy, sickle cell trait EXAM: CHEST - 2 VIEW COMPARISON:  CT chest of 05/24/2017 FINDINGS: No active infiltrate or effusion is seen. Right-sided Port-A-Cath tip overlies the lower SVC above the expected right atrial junction. Mediastinal and hilar contours are unremarkable and the heart is within normal limits in size. No bony abnormality is seen. IMPRESSION: No active lung disease. Right-sided Port-A-Cath tip overlies the lower SVC. Electronically Signed   By: Ivar Drape M.D.   On: 06/22/2017 12:17   Ct Pelvis W Contrast  Result Date: 06/23/2017 CLINICAL DATA:  42 y/o M; squamous cell penile cancer 8/18 with chemotherapy completed 12/18 and x-ray therapy completed 1/19. EXAM: CT PELVIS WITH CONTRAST TECHNIQUE: Multidetector CT imaging of the pelvis was performed using the standard protocol following the bolus administration of intravenous contrast. CONTRAST:  64mL ISOVUE-300 IOPAMIDOL (ISOVUE-300) INJECTION 61% COMPARISON:  06/06/2017 CT abdomen and pelvis. FINDINGS: Urinary Tract:  Mild bladder wall thickening. Bowel:  Unremarkable visualized pelvic bowel loops. Vascular/Lymphatic: Extensive left para-aortic, left iliac, and left inguinal lymphadenopathy with representative nodes as follows: 1. Left lower para-aortic 26 x 15 mm, previously 28 x 18 mm, series 2: Image 2. 2.  Left common iliac,  17 mm short axis, previously 21 mm, 2:16. 3. Left inguinal near bifurcation, 43 mm short axis, previously 45 mm, 2:28. 4. Largest left inguinal, 72 mm short axis, previously 72 mm remeasured in similar fashion, 2:34. 5. Lower left inguinal, 44 x 38 mm, previously 43 x 38 mm, 2:51. Interval enlargement of right inguinal lymph node measuring 26 x 19 mm, previously 19 x 14 mm (2:38). Reproductive:  Ulcerative penile mass.  Prostatic calcifications. Other: Diffuse edema of the left lower extremity, likely related to lymphatic obstruction. Musculoskeletal: No suspicious bone lesions identified. IMPRESSION: 1. Mildly decreased left lower para-aortic and left common iliacs lymph node size. Stable left inguinal lymph node size. Interval increase in right inguinal lymph node size. 2. Severe edema of left lower extremity, likely lymphatic obstruction. Electronically Signed   By: Kristine Garbe M.D.   On: 06/23/2017 15:07   Ct Abdomen Pelvis W Contrast  Result Date: 06/06/2017 CLINICAL DATA:  Followup metastatic squamous cell carcinoma of penis. Undergoing chemotherapy and radiation therapy. EXAM: CT ABDOMEN AND PELVIS WITH CONTRAST TECHNIQUE: Multidetector CT imaging of the abdomen and pelvis was performed using the standard protocol following bolus administration of intravenous contrast. CONTRAST:  167mL ISOVUE-300 IOPAMIDOL (ISOVUE-300) INJECTION 61% COMPARISON:  03/10/2017 FINDINGS: Lower Chest: No acute findings. Hepatobiliary: No hepatic masses identified. Gallbladder is unremarkable. Pancreas:  No mass or inflammatory changes. Spleen: Within normal limits in size and appearance. Adrenals/Urinary Tract: A small cystic lesion is seen in the medial lower pole of the right kidney which measures 1.6 cm. This shows dependent enhancement, which may represent mural soft tissue density or excreted contrast within a small caliceal diverticulum. No other renal lesions are seen. No evidence of  hydronephrosis. Mild diffuse bladder wall thickening, without significant change. Stomach/Bowel: No evidence of bowel obstruction, inflammatory changes, or abnormal fluid collections. Vascular/Lymphatic: Retroperitoneal and retrocrural lymphadenopathy is seen within the abdomen, with no significant change compared to previous study. Index lymph node in the left paraaortic region measures 3.1 cm on image 37/2, compared with 3.2 cm previously. Mild decrease in pelvic lymphadenopathy is seen in the left common iliac chain, with index lymph node measuring 2.1 cm on image 57/2 compared to 2.7 cm previously. Bulky left iliac lymphadenopathy shows no significant change with index lymph node near the iliac bifurcation measuring 4.5 cm on image 70/2 which is unchanged since prior study. Bulky left inguinal lymphadenopathy measures 6.8 cm on image 80/2 compared to 6.2 cm previously. No new sites of lymphadenopathy identified. Reproductive:  No mass or other significant abnormality. Other:  None. Musculoskeletal: No suspicious bone lesions identified. Left leg lymphedema again demonstrated. IMPRESSION: Overall no significant change in abdominal and left pelvic lymphadenopathy. Electronically Signed   By: Earle Gell M.D.   On: 06/06/2017 17:03   Dg Chest Port 1 View  Result Date: 06/26/2017 CLINICAL DATA:  Fever EXAM: PORTABLE CHEST 1 VIEW COMPARISON:  06/22/2017 FINDINGS: The heart size and mediastinal contours are within normal limits. Both lungs are clear. Minimal atelectasis or scarring is seen at the left lung base. Right-sided port catheter is noted with tip at the cavoatrial juncture. The visualized skeletal structures are unremarkable. IMPRESSION: No active disease. Minimal atelectasis and/or scarring at the left lung base. Port catheter tip at the cavoatrial junction. Electronically Signed   By: Ashley Royalty M.D.   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 EXAM: ULTRASOUND-GUIDED BIOPSY AND ASPIRATION OF THE CHRONIC LEFT INGUINAL NODAL CONGLOMERATION COMPARISON:  CT abdomen and pelvis - 06/06/2017 MEDICATIONS: None ANESTHESIA/SEDATION: Moderate (conscious) sedation was employed during this procedure. A total of Versed 4 mg and Fentanyl 100 mcg was administered intravenously. Moderate Sedation Time: 27 minutes. The patient's level of consciousness and vital signs were monitored continuously by radiology nursing throughout the procedure under my direct supervision. COMPLICATIONS: None immediate. TECHNIQUE: Informed written consent was obtained from the patient after a discussion of the risks, benefits and alternatives to treatment. Questions regarding the procedure were encouraged and answered. Initial ultrasound scanning demonstrated multiple probably cystic lymph nodes within the left groin with dominant probably cystic lymph node measuring approximately 10.7 x 7.2 cm (image 4). An ultrasound image was saved for documentation purposes. The procedure was planned. A timeout was performed prior to the initiation of the procedure. The operative was prepped and draped in the usual sterile fashion, and a sterile drape was applied covering the operative field. A timeout was performed prior to the initiation of the procedure. Local anesthesia was provided with 1% lidocaine with epinephrine. Under direct ultrasound guidance, an 18 gauge core needle device was advanced into the peripheral aspect of the cystic lymph node. Immediately following trocar needle placement, dark red/brown reflux from the inner stylet. As such, the trocar needle was connected to a evacuation bag in approximately 150 cc of additional dark red/brown fluid was aspirated. Next, the 18 gauge trocar needle was advanced into 2 adjacent  more solid appearing lymph nodes within the right groin, likely correlating with the adjacent lymph node seen preceding abdominal CT image 93, series 2. Approximately 15 cc of slightly caseating fluid was aspirated from both of these adjacent lymph nodes. All aspirated fluid was capped and sent to the laboratory for surgical pathologic analysis. Following evacuation of the majority of the dominant cystic component of the more superficial lymph node, a complex fluid collection with the deeper aspect of the medial thigh likely correlating with the collection seen on image 84, series 2) was targeted for biopsy (ultrasound image 20) again, an 18 gauge trocar needle was advanced into this cystic lymph node and an additional 50 cc of dark red/brown fluid was aspirated. All aspirated fluid was capped and sent to the laboratory for cytologic analysis. Postprocedural imaging was and the procedure was terminated. Post procedure scan was negative for significant hematoma. A dressing was placed. The patient tolerated the procedure well without immediate postprocedural complication. IMPRESSION: Technically successful ultrasound guided aspiration and biopsy of several adjacent predominantly cystic lymph nodes within the left groin. Note, approximately 200 cc of dark red/brown fluid was aspirated and sent to the laboratory for cytologic analysis. Electronically Signed   By: Sandi Mariscal M.D.   On: 06/20/2017 17:04    DISCHARGE EXAMINATION: Vitals:   06/28/17 0041 06/28/17 0105 06/28/17 0300 06/28/17 1325  BP: 125/78 136/78 129/79 (!) 145/95  Pulse: (!) 101 (!) 107 (!) 102 (!) 105  Resp: 16 18 18 18   Temp: 99.5 F (37.5 C) 99.4 F (37.4 C) 98.1 F (36.7 C) 98.7 F (37.1 C)  TempSrc: Oral Oral Oral   SpO2: 100% 100% 100% 100%  Weight:      Height:       General appearance: alert, cooperative, appears stated age and no distress Resp: clear to auscultation bilaterally Cardio: regular rate and rhythm, S1, S2 normal,  no murmur, click, rub  or gallop GI: Open wound noted in the suprapubic area with some yellowish exudate.  No erythema noted.  Mildly tender to palpation.  DISPOSITION: Home  Discharge Instructions    Ambulatory referral to Wound Clinic   Complete by:  As directed    For management of suprapubic wound. Seen by gen surgery during this hospital stay.   Call MD for:  extreme fatigue   Complete by:  As directed    Call MD for:  persistant dizziness or light-headedness   Complete by:  As directed    Call MD for:  persistant nausea and vomiting   Complete by:  As directed    Call MD for:  redness, tenderness, or signs of infection (pain, swelling, redness, odor or green/yellow discharge around incision site)   Complete by:  As directed    Call MD for:  severe uncontrolled pain   Complete by:  As directed    Call MD for:  temperature >100.4   Complete by:  As directed    Diet general   Complete by:  As directed    Discharge instructions   Complete by:  As directed    Be sure to follow-up with Dr. Alen Blew for further management of her cancer.  Further has been sent to the wound center.  You were prescribed pain medications by Dr. Alen Blew recently.  You will need to ask him to refill the medication.  You were cared for by a hospitalist during your hospital stay. If you have any questions about your discharge medications or the care you received while you were in the hospital after you are discharged, you can call the unit and asked to speak with the hospitalist on call if the hospitalist that took care of you is not available. Once you are discharged, your primary care physician will handle any further medical issues. Please note that NO REFILLS for any discharge medications will be authorized once you are discharged, as it is imperative that you return to your primary care physician (or establish a relationship with a primary care physician if you do not have one) for your aftercare needs so that  they can reassess your need for medications and monitor your lab values. If you do not have a primary care physician, you can call [phone] for a physician referral.   Increase activity slowly   Complete by:  As directed        Allergies as of 06/28/2017   No Known Allergies     Medication List    STOP taking these medications   enoxaparin 30 MG/0.3ML injection Commonly known as:  LOVENOX   lidocaine 2 % jelly Commonly known as:  XYLOCAINE   Rivaroxaban 15 & 20 MG Tbpk     TAKE these medications   amLODipine 10 MG tablet Commonly known as:  NORVASC Take 1 tablet (10 mg total) by mouth daily.   cephALEXin 500 MG capsule Commonly known as:  KEFLEX Take 1 capsule (500 mg total) by mouth every 6 (six) hours for 7 days. Notes to patient:  Take at 7:00am, 1:00pm, 11:00pm   doxycycline 100 MG tablet Commonly known as:  VIBRA-TABS Take 1 tablet (100 mg total) by mouth every 12 (twelve) hours for 7 days. Notes to patient:  Take at 10:00am and 10:00pm   lidocaine-prilocaine cream Commonly known as:  EMLA Apply 1 application topically as needed. Apply to porta cath before chemotherapy.   oxyCODONE-acetaminophen 5-325 MG tablet Commonly known as:  PERCOCET/ROXICET Take 1  tablet by mouth every 4 (four) hours as needed for severe pain.   prochlorperazine 10 MG tablet Commonly known as:  COMPAZINE Take 1 tablet (10 mg total) by mouth every 6 (six) hours as needed for nausea or vomiting.        Follow-up Information    Wyatt Portela, MD. Schedule an appointment as soon as possible for a visit in 1 week(s).   Specialty:  Oncology Contact information: [place] [address] [phone]        [facility]             . Go on 07/10/2017.   Why:  Appointment at [place]. Please come 5 minutes early. Contact information: [address] [place] [address] [phone]          TOTAL  DISCHARGE TIME: 66 minutes  [facility] Pager [phone]  06/28/2017, 3:34 PM

## 2017-06-28 NOTE — Discharge Instructions (Signed)
Wound Care: Daily loose packing with 1/4" or 1/2" iodoform gauze to act as a wick.  Cover with dry dressing and change as needed.  Clean skin daily with mild soap and water.

## 2017-06-28 NOTE — Progress Notes (Signed)
This CM has contacted multiple home health agencies to obtain [facility] for wound care. This CM has been unsuccessful in obtaining a HHRN. Staff RN to do teaching with pt to ensure he is able to do his own wound care prior to discharge. MD made ambulatory referral to wound care center for follow up. This CM contacted wound care center to ensure referral was received. Appointment was made for pt at the wound care center and appointment placed on AVS. All information given to both pt and staff RN. Marney Doctor RN,BSN,NCM [phone]

## 2017-06-29 LAB — TYPE AND SCREEN
ABO/RH(D): O POS
ANTIBODY SCREEN: NEGATIVE
UNIT DIVISION: 0
Unit division: 0

## 2017-06-29 LAB — BPAM RBC
Blood Product Expiration Date: 201904102359
Blood Product Expiration Date: 201904102359
ISSUE DATE / TIME: 201903192123
ISSUE DATE / TIME: 201903200035
Unit Type and Rh: 5100
Unit Type and Rh: 5100

## 2017-06-30 LAB — CULTURE, BLOOD (ROUTINE X 2)
CULTURE: NO GROWTH
CULTURE: NO GROWTH
SPECIAL REQUESTS: ADEQUATE
Special Requests: ADEQUATE

## 2017-07-10 ENCOUNTER — Telehealth: Payer: Self-pay | Admitting: Oncology

## 2017-07-10 ENCOUNTER — Other Ambulatory Visit: Payer: Self-pay | Admitting: *Deleted

## 2017-07-10 ENCOUNTER — Encounter (HOSPITAL_BASED_OUTPATIENT_CLINIC_OR_DEPARTMENT_OTHER): Payer: Medicaid Other | Attending: Internal Medicine

## 2017-07-10 DIAGNOSIS — N485 Ulcer of penis: Secondary | ICD-10-CM

## 2017-07-10 DIAGNOSIS — C7989 Secondary malignant neoplasm of other specified sites: Secondary | ICD-10-CM | POA: Insufficient documentation

## 2017-07-10 DIAGNOSIS — C609 Malignant neoplasm of penis, unspecified: Secondary | ICD-10-CM

## 2017-07-10 DIAGNOSIS — Z9221 Personal history of antineoplastic chemotherapy: Secondary | ICD-10-CM | POA: Insufficient documentation

## 2017-07-10 DIAGNOSIS — Z923 Personal history of irradiation: Secondary | ICD-10-CM | POA: Insufficient documentation

## 2017-07-10 DIAGNOSIS — Z86718 Personal history of other venous thrombosis and embolism: Secondary | ICD-10-CM | POA: Insufficient documentation

## 2017-07-10 DIAGNOSIS — D573 Sickle-cell trait: Secondary | ICD-10-CM | POA: Insufficient documentation

## 2017-07-10 DIAGNOSIS — C601 Malignant neoplasm of glans penis: Secondary | ICD-10-CM | POA: Insufficient documentation

## 2017-07-10 DIAGNOSIS — X58XXXA Exposure to other specified factors, initial encounter: Secondary | ICD-10-CM | POA: Insufficient documentation

## 2017-07-10 DIAGNOSIS — S31104A Unspecified open wound of abdominal wall, left lower quadrant without penetration into peritoneal cavity, initial encounter: Secondary | ICD-10-CM | POA: Insufficient documentation

## 2017-07-10 DIAGNOSIS — Z7901 Long term (current) use of anticoagulants: Secondary | ICD-10-CM | POA: Insufficient documentation

## 2017-07-10 DIAGNOSIS — F17200 Nicotine dependence, unspecified, uncomplicated: Secondary | ICD-10-CM | POA: Insufficient documentation

## 2017-07-10 MED ORDER — OXYCODONE-ACETAMINOPHEN 5-325 MG PO TABS: 1.0000 | ORAL_TABLET | ORAL | 0 refills | Status: DC | PRN

## 2017-07-10 MED ORDER — OXYCODONE-ACETAMINOPHEN 5-325 MG PO TABS: 2.0000 | ORAL_TABLET | ORAL | 0 refills | Status: DC | PRN

## 2017-07-10 NOTE — Telephone Encounter (Signed)
Spoke with patient re f/u 4/4.

## 2017-07-13 ENCOUNTER — Telehealth: Payer: Self-pay | Admitting: Oncology

## 2017-07-13 ENCOUNTER — Inpatient Hospital Stay: Payer: Medicaid Other | Attending: Oncology | Admitting: Oncology

## 2017-07-13 ENCOUNTER — Encounter (HOSPITAL_COMMUNITY): Payer: Self-pay | Admitting: Oncology

## 2017-07-13 DIAGNOSIS — C649 Malignant neoplasm of unspecified kidney, except renal pelvis: Secondary | ICD-10-CM | POA: Diagnosis not present

## 2017-07-13 DIAGNOSIS — Z5111 Encounter for antineoplastic chemotherapy: Secondary | ICD-10-CM | POA: Insufficient documentation

## 2017-07-13 DIAGNOSIS — D573 Sickle-cell trait: Secondary | ICD-10-CM | POA: Diagnosis not present

## 2017-07-13 DIAGNOSIS — Z7189 Other specified counseling: Secondary | ICD-10-CM

## 2017-07-13 DIAGNOSIS — I82622 Acute embolism and thrombosis of deep veins of left upper extremity: Secondary | ICD-10-CM

## 2017-07-13 DIAGNOSIS — I10 Essential (primary) hypertension: Secondary | ICD-10-CM

## 2017-07-13 DIAGNOSIS — N289 Disorder of kidney and ureter, unspecified: Secondary | ICD-10-CM | POA: Insufficient documentation

## 2017-07-13 DIAGNOSIS — Z79899 Other long term (current) drug therapy: Secondary | ICD-10-CM

## 2017-07-13 DIAGNOSIS — Z7901 Long term (current) use of anticoagulants: Secondary | ICD-10-CM

## 2017-07-13 DIAGNOSIS — C609 Malignant neoplasm of penis, unspecified: Secondary | ICD-10-CM | POA: Insufficient documentation

## 2017-07-13 DIAGNOSIS — Z923 Personal history of irradiation: Secondary | ICD-10-CM | POA: Diagnosis not present

## 2017-07-13 DIAGNOSIS — C786 Secondary malignant neoplasm of retroperitoneum and peritoneum: Secondary | ICD-10-CM | POA: Insufficient documentation

## 2017-07-13 DIAGNOSIS — Z9221 Personal history of antineoplastic chemotherapy: Secondary | ICD-10-CM | POA: Diagnosis not present

## 2017-07-13 MED ORDER — PROCHLORPERAZINE MALEATE 10 MG PO TABS: 10.0000 mg | ORAL_TABLET | Freq: Four times a day (QID) | ORAL | 0 refills | Status: AC | PRN

## 2017-07-13 MED ORDER — AMLODIPINE BESYLATE 10 MG PO TABS: 10.0000 mg | ORAL_TABLET | Freq: Every day | ORAL | 5 refills | Status: AC

## 2017-07-13 NOTE — Telephone Encounter (Signed)
Scheduled appt per 4/4 los - Gave patient AVS and calender per los.  

## 2017-07-13 NOTE — Progress Notes (Signed)
DISCONTINUE OFF PATHWAY REGIMEN - [Other Dx]   [id number]:Carboplatin + Paclitaxel (5/175) q21 Days:   A cycle is every 21 days:     Paclitaxel      Carboplatin   **Always confirm dose/schedule in your pharmacy ordering system**    REASON: Other Reason PRIOR TREATMENT: Carboplatin + Paclitaxel (5/175) q21 Days  START OFF PATHWAY REGIMEN - [Other Dx]   [id number]:Pembrolizumab 200 mg q21 Days:   A cycle is 21 days:     Pembrolizumab   **Always confirm dose/schedule in your pharmacy ordering system**    Patient Characteristics: Intent of Therapy: Non-Curative / Palliative Intent, Discussed with Patient

## 2017-07-13 NOTE — Progress Notes (Signed)
Hematology and Oncology Follow Up Visit  Ronald Wilson [id number] 10-05-1975 42 y.o. 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  His tumor showed a positive PDL 1 expression   Prior Therapy:   He is status post a biopsy of his lymphadenopathy in August 2018.  He is S/P cisplatin and 5-FU palliative chemotherapy.  He completed 3 cycles in November 2018 and developed progression of disease.  He is status post palliative radiation therapy between April 05, 2017 and April 20, 2017.  He received 30 Gy in 10 fractions to the pelvis and the periaortic areas.  He developed left upper extremity deep vein thrombosis and was started on Xarelto.  Current therapy: Under evaluation to start salvage therapy with Pembrolizumab   Interim History: Mr. Wanzer presents today for a follow-up.  Since her last visit, he was hospitalized for UTI sepsis and increased pain and bleeding in March 2019.  At that time, he was found to have bleeding from the penis and Xarelto was discontinued at the time.  Since his discharge, he has been feeling better at this time.  Continues to have pain related to his pelvic adenopathy and penile lesion.  He takes Percocet every 4-6 hours with improvement in his pain.  He does have a open pelvic wound and currently follows up at the wound center.   He remains active and continues to attend to activities of daily living.  Still able to drive and care for his elderly father.  His appetite has been poor and of lost more weight.  He continues to take Norvasc for blood pressure despite his weight loss.   He does not report any headaches, blurry vision, syncope or seizures. He does not report any fevers, chills or sweats.  He does not report any cough, wheezing or hemoptysis.  He does not report any chest  pain, palpitation he and has bilateral lower extremity edema.  He does not report any nausea vomiting or abdominal pain. He does not report any constipation, diarrhea or over the satiety. He does not report any hematochezia or melena. He does not report any hematuria or dysuria.  He does not report any frequency urgency or hesitancy.  He does not report any arthralgias or myalgias. He does not report any skin rashes or lesions.  Remaining review of systems is negative.  Medications: I have reviewed the patient's current medications.  Current Outpatient Medications  Medication Sig Dispense Refill  . amLODipine (NORVASC) 10 MG tablet Take 1 tablet (10 mg total) by mouth daily. 30 tablet 5  . lidocaine-prilocaine (EMLA) cream Apply 1 application topically as needed. Apply to porta cath before chemotherapy. 30 g 0  . oxyCODONE-acetaminophen (PERCOCET/ROXICET) 5-325 MG tablet Take 2 tablets by mouth every 4 (four) hours as needed for severe pain. 120 tablet 0  . prochlorperazine (COMPAZINE) 10 MG tablet Take 1 tablet (10 mg total) by mouth every 6 (six) hours as needed for nausea or vomiting. 30 tablet 0   No current facility-administered medications for this visit.      Allergies: No Known Allergies  Past Medical History, Surgical history, Social history, and Family History reviewed today and unchanged.  [name] Physical Exam: Blood pressure 130/83, pulse (!) 124, temperature 98.7 F (37.1 C), temperature source Oral, resp. rate 20, height 5\' 9"  (1.753 m), weight 184 lb 3.2 oz (83.6 kg),  SpO2 100 %.   ECOG: 0 General appearance: Chronically ill-appearing gentleman appeared comfortable today. Head: Atraumatic without abnormalities. Oropharynx: No oral thrush or ulcers. Lymph nodes: No cervical or axillary lymphadenopathy.  Bulky inguinal adenopathy noted more left than right. heart:regular rate and rhythm, S1, S2 normal, no murmur, click, rub or gallop Lung: Clear without any wheezes or dullness  to percussion. Abdomin: Soft, nontender without any shifting dullness or ascites. GU exam: Penile lesion noted.  Bulky adenopathy noted. Musculoskeletal no joint deformity or effusion.  Leg edema noted left more than right. Skin: No rashes or lesions. Neurological: No neurological deficits noted.   Lab Results: Lab Results  Component Value Date   WBC 14.1 (H) 06/28/2017   HGB 9.4 (L) 06/28/2017   HCT 29.1 (L) 06/28/2017   MCV 84.6 06/28/2017   PLT 511 (H) 06/28/2017     Chemistry      Component Value Date/Time   NA 138 06/28/2017 0536   NA 140 03/13/2017 0827   K 3.6 06/28/2017 0536   K 3.6 03/13/2017 0827   CL 105 06/28/2017 0536   CO2 25 06/28/2017 0536   CO2 28 03/13/2017 0827   BUN 12 06/28/2017 0536   BUN 15.6 03/13/2017 0827   CREATININE 1.09 06/28/2017 0536   CREATININE 1.82 (H) 06/06/2017 1149   CREATININE 2.0 (H) 03/13/2017 0827      Component Value Date/Time   CALCIUM 8.6 (L) 06/28/2017 0536   CALCIUM 10.0 03/13/2017 0827   ALKPHOS 66 06/22/2017 0911   ALKPHOS 78 03/13/2017 0827   AST 14 06/22/2017 0911   AST 14 06/06/2017 1149   AST 15 03/13/2017 0827   ALT 12 06/22/2017 0911   ALT 9 06/06/2017 1149   ALT 10 03/13/2017 0827   BILITOT 0.3 06/22/2017 0911   BILITOT 0.3 06/06/2017 1149   BILITOT 0.22 03/13/2017 0827     EXAM: CT PELVIS WITH CONTRAST  TECHNIQUE: Multidetector CT imaging of the pelvis was performed using the standard protocol following the bolus administration of intravenous contrast.  CONTRAST:  39mL ISOVUE-300 IOPAMIDOL (ISOVUE-300) INJECTION 61%  COMPARISON:  06/06/2017 CT abdomen and pelvis.  FINDINGS: Urinary Tract:  Mild bladder wall thickening.  Bowel:  Unremarkable visualized pelvic bowel loops.  Vascular/Lymphatic: Extensive left para-aortic, left iliac, and left inguinal lymphadenopathy with representative nodes as follows:  1. Left lower para-aortic 26 x 15 mm, previously 28 x 18 mm, series 2: Image 2. 2.  Left common iliac, 17 mm short axis, previously 21 mm, 2:16. 3. Left inguinal near bifurcation, 43 mm short axis, previously 45 mm, 2:28. 4. Largest left inguinal, 72 mm short axis, previously 72 mm remeasured in similar fashion, 2:34. 5. Lower left inguinal, 44 x 38 mm, previously 43 x 38 mm, 2:51.  Interval enlargement of right inguinal lymph node measuring 26 x 19 mm, previously 19 x 14 mm (2:38).  Reproductive:  Ulcerative penile mass.  Prostatic calcifications.  Other: Diffuse edema of the left lower extremity, likely related to lymphatic obstruction.  Musculoskeletal: No suspicious bone lesions identified.  IMPRESSION: 1. Mildly decreased left lower para-aortic and left common iliacs lymph node size. Stable left inguinal lymph node size. Interval increase in right inguinal lymph node size. 2. Severe edema of left lower extremity, likely lymphatic obstruction.    Impression and Plan:  42 year old gentleman with the following issues:  1.  Metastatic squamous cell carcinoma involving retroperitoneal and pelvic adenopathy arising from the penis diagnosed in August 2018.  He has progressed on 5-FU  and cisplatin indicating chemo refractory disease.  Repeat biopsy obtained on June 20, 2017 of the inguinal lymph node which showed squamous cell carcinoma with extensive necrosis.  His PDL 1 testing showed total proportion score of 1%.  Options of therapy were reviewed today which include traditional chemotherapy utilizing carboplatin and Taxotere versus immunotherapy.  Given his positive PDL 1 testing, and the fact that he has chemotherapy refractory, trial of Pembrolizumab would be reasonable.  Complications related to immunotherapy were reviewed today which include arthralgias, myalgias, skin rash, pruritus, thyroiditis and pneumonitis in addition to colitis.  After discussion is agreeable to proceed in the near future.    2. IV access: Port-A-Cath will be used for his  infusion.  3. Renal function surveillance: His creatinine remained relatively stable at this time.  5.  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  He is status post transfusion in the past and his hemoglobin currently at 9.4.  6.  Left upper extremity deep vein thrombosis: Xarelto has been discontinued because of bleeding.  7.  Prognosis: Poor with limited life expectancy.  His performance status remains adequate at this time and aggressive therapy is warranted.  8. Follow-up: Will be in the next few weeks to start systemic therapy.  25  minutes was spent with the patient face-to-face today.  More than 50% of time was dedicated to patient counseling, education and coordination of his care.   Zola Button, MD 4/4/201912:01 PM

## 2017-07-19 ENCOUNTER — Other Ambulatory Visit: Payer: Self-pay | Admitting: *Deleted

## 2017-07-19 ENCOUNTER — Inpatient Hospital Stay: Payer: Medicaid Other

## 2017-07-19 ENCOUNTER — Encounter (HOSPITAL_COMMUNITY): Payer: Self-pay | Admitting: Oncology

## 2017-07-19 VITALS — BP 142/82 | HR 106 | Temp 98.8°F | Resp 19

## 2017-07-19 DIAGNOSIS — Z95828 Presence of other vascular implants and grafts: Secondary | ICD-10-CM | POA: Insufficient documentation

## 2017-07-19 DIAGNOSIS — C609 Malignant neoplasm of penis, unspecified: Secondary | ICD-10-CM

## 2017-07-19 DIAGNOSIS — Z7189 Other specified counseling: Secondary | ICD-10-CM

## 2017-07-19 DIAGNOSIS — Z5111 Encounter for antineoplastic chemotherapy: Secondary | ICD-10-CM | POA: Diagnosis not present

## 2017-07-19 DIAGNOSIS — N485 Ulcer of penis: Secondary | ICD-10-CM

## 2017-07-19 LAB — COMPREHENSIVE METABOLIC PANEL
ALK PHOS: 86 U/L (ref 40–150)
ALT: 11 U/L (ref 0–55)
AST: 13 U/L (ref 5–34)
Albumin: 2.9 g/dL — ABNORMAL LOW (ref 3.5–5.0)
Anion gap: 7 (ref 3–11)
BUN: 14 mg/dL (ref 7–26)
CALCIUM: 9.8 mg/dL (ref 8.4–10.4)
CO2: 26 mmol/L (ref 22–29)
CREATININE: 1.39 mg/dL — AB (ref 0.70–1.30)
Chloride: 106 mmol/L (ref 98–109)
Glucose, Bld: 116 mg/dL (ref 70–140)
Potassium: 3.7 mmol/L (ref 3.5–5.1)
Sodium: 139 mmol/L (ref 136–145)
TOTAL PROTEIN: 7.3 g/dL (ref 6.4–8.3)

## 2017-07-19 LAB — CBC WITH DIFFERENTIAL/PLATELET
BASOS PCT: 1 %
Basophils Absolute: 0.1 10*3/uL (ref 0.0–0.1)
EOS ABS: 0.2 10*3/uL (ref 0.0–0.5)
Eosinophils Relative: 1 %
HCT: 29.2 % — ABNORMAL LOW (ref 38.4–49.9)
Hemoglobin: 9.5 g/dL — ABNORMAL LOW (ref 13.0–17.1)
Lymphocytes Relative: 4 %
Lymphs Abs: 0.5 10*3/uL — ABNORMAL LOW (ref 0.9–3.3)
MCH: 26.3 pg — ABNORMAL LOW (ref 27.2–33.4)
MCHC: 32.5 g/dL (ref 32.0–36.0)
MCV: 80.9 fL (ref 79.3–98.0)
MONOS PCT: 6 %
Monocytes Absolute: 0.8 10*3/uL (ref 0.1–0.9)
NEUTROS PCT: 88 %
Neutro Abs: 12.5 10*3/uL — ABNORMAL HIGH (ref 1.5–6.5)
Platelets: 408 10*3/uL — ABNORMAL HIGH (ref 140–400)
RBC: 3.62 MIL/uL — ABNORMAL LOW (ref 4.20–5.82)
RDW: 17.8 % — AB (ref 11.0–14.6)
WBC: 14.2 10*3/uL — AB (ref 4.0–10.3)

## 2017-07-19 LAB — TSH: TSH: 0.93 u[IU]/mL (ref 0.320–4.118)

## 2017-07-19 MED ORDER — SODIUM CHLORIDE 0.9% FLUSH
10.0000 mL | INTRAVENOUS | Status: AC | PRN
  Filled 2017-07-19: qty 10

## 2017-07-19 MED ORDER — OXYCODONE-ACETAMINOPHEN 5-325 MG PO TABS: 2.0000 | ORAL_TABLET | ORAL | 0 refills | Status: DC | PRN

## 2017-07-19 MED ORDER — SODIUM CHLORIDE 0.9 % IV SOLN
200.0000 mg | Freq: Once | INTRAVENOUS | Status: AC
  Administered 2017-07-19: 200 mg via INTRAVENOUS
  Filled 2017-07-19: qty 8

## 2017-07-19 MED ORDER — HEPARIN SOD (PORK) LOCK FLUSH 100 UNIT/ML IV SOLN
500.0000 [IU] | Freq: Once | INTRAVENOUS | Status: AC | PRN
  Filled 2017-07-19: qty 5

## 2017-07-19 MED ORDER — SODIUM CHLORIDE 0.9% FLUSH
10.0000 mL | INTRAVENOUS | Status: DC | PRN
Start: 2017-07-19 — End: 2017-07-19
  Administered 2017-07-19: 10 mL
  Filled 2017-07-19: qty 10

## 2017-07-19 MED ORDER — SODIUM CHLORIDE 0.9 % IV SOLN
Freq: Once | INTRAVENOUS | Status: AC
  Administered 2017-07-19: 16:00:00 via INTRAVENOUS

## 2017-07-19 NOTE — Patient Instructions (Addendum)
[facility] Discharge Instructions for Patients Receiving Chemotherapy  Today you received the following chemotherapy agent: Keytruda  To help prevent nausea and vomiting after your treatment, we encourage you to take your nausea medication as prescribed.   If you develop nausea and vomiting that is not controlled by your nausea medication, call the clinic.   BELOW ARE SYMPTOMS THAT SHOULD BE REPORTED IMMEDIATELY:  *FEVER GREATER THAN 100.5 F  *CHILLS WITH OR WITHOUT FEVER  NAUSEA AND VOMITING THAT IS NOT CONTROLLED WITH YOUR NAUSEA MEDICATION  *UNUSUAL SHORTNESS OF BREATH  *UNUSUAL BRUISING OR BLEEDING  TENDERNESS IN MOUTH AND THROAT WITH OR WITHOUT PRESENCE OF ULCERS  *URINARY PROBLEMS  *BOWEL PROBLEMS  UNUSUAL RASH Items with * indicate a potential emergency and should be followed up as soon as possible.  Feel free to call the clinic should you have any questions or concerns. The clinic phone number is [phone].  Please show the [place] at check-in to the Emergency Department and triage nurse.  Pembrolizumab injection What is this medicine? PEMBROLIZUMAB (pem broe liz ue mab) is a monoclonal antibody. It is used to treat melanoma, head and neck cancer, Hodgkin lymphoma, non-small cell lung cancer, urothelial cancer, stomach cancer, and cancers that have a certain genetic condition. This medicine may be used for other purposes; ask your health care provider or pharmacist if you have questions. COMMON BRAND NAME(S): Keytruda What should I tell my health care provider before I take this medicine? They need to know if you have any of these conditions: -diabetes -immune system problems -inflammatory bowel disease -liver disease -lung or breathing disease -lupus -organ transplant -an unusual or allergic reaction to pembrolizumab, other medicines, foods, dyes, or preservatives -pregnant or trying to get pregnant -breast-feeding How should I  use this medicine? This medicine is for infusion into a vein. It is given by a health care professional in a hospital or clinic setting. A special MedGuide will be given to you before each treatment. Be sure to read this information carefully each time. Talk to your pediatrician regarding the use of this medicine in children. While this drug may be prescribed for selected conditions, precautions do apply. Overdosage: If you think you have taken too much of this medicine contact a poison control center or emergency room at once. NOTE: This medicine is only for you. Do not share this medicine with others. What if I miss a dose? It is important not to miss your dose. Call your doctor or health care professional if you are unable to keep an appointment. What may interact with this medicine? Interactions have not been studied. Give your health care provider a list of all the medicines, herbs, non-prescription drugs, or dietary supplements you use. Also tell them if you smoke, drink alcohol, or use illegal drugs. Some items may interact with your medicine. This list may not describe all possible interactions. Give your health care provider a list of all the medicines, herbs, non-prescription drugs, or dietary supplements you use. Also tell them if you smoke, drink alcohol, or use illegal drugs. Some items may interact with your medicine. What should I watch for while using this medicine? Your condition will be monitored carefully while you are receiving this medicine. You may need blood work done while you are taking this medicine. Do not become pregnant while taking this medicine or for 4 months after stopping it. Women should inform their doctor if they wish to become pregnant or think they might  be pregnant. There is a potential for serious side effects to an unborn child. Talk to your health care professional or pharmacist for more information. Do not breast-feed an infant while taking this medicine or  for 4 months after the last dose. What side effects may I notice from receiving this medicine? Side effects that you should report to your doctor or health care professional as soon as possible: -allergic reactions like skin rash, itching or hives, swelling of the face, lips, or tongue -bloody or black, tarry -breathing problems -changes in vision -chest pain -chills -constipation -cough -dizziness or feeling faint or lightheaded -fast or irregular heartbeat -fever -flushing -hair loss -low blood counts - this medicine may decrease the number of white blood cells, red blood cells and platelets. You may be at increased risk for infections and bleeding. -muscle pain -muscle weakness -persistent headache -signs and symptoms of high blood sugar such as dizziness; dry mouth; dry skin; fruity breath; nausea; stomach pain; increased hunger or thirst; increased urination -signs and symptoms of kidney injury like trouble passing urine or change in the amount of urine -signs and symptoms of liver injury like dark urine, light-colored stools, loss of appetite, nausea, right upper belly pain, yellowing of the eyes or skin -stomach pain -sweating -weight loss Side effects that usually do not require medical attention (report to your doctor or health care professional if they continue or are bothersome): -decreased appetite -diarrhea -tiredness This list may not describe all possible side effects. Call your doctor for medical advice about side effects. You may report side effects to FDA at 1-800-FDA-1088. Where should I keep my medicine? This drug is given in a hospital or clinic and will not be stored at home. NOTE: This sheet is a summary. It may not cover all possible information. If you have questions about this medicine, talk to your doctor, pharmacist, or health care provider.  2018 Elsevier/Gold Standard (0315-94-58 59:29:24)  Complications of immunotherapy may include arthralgias,  myalgias, skin rash, pruritus, thyroiditis and pneumonitis in addition to colitis.

## 2017-07-20 ENCOUNTER — Other Ambulatory Visit (HOSPITAL_COMMUNITY)
Admission: RE | Admit: 2017-07-20 | Discharge: 2017-07-20 | Disposition: A | Discharge status: Home / Self Care | Payer: Medicaid Other | Source: Other Acute Inpatient Hospital | Attending: Internal Medicine | Admitting: Internal Medicine

## 2017-07-20 DIAGNOSIS — X58XXXD Exposure to other specified factors, subsequent encounter: Secondary | ICD-10-CM | POA: Diagnosis not present

## 2017-07-20 DIAGNOSIS — C601 Malignant neoplasm of glans penis: Secondary | ICD-10-CM | POA: Diagnosis present

## 2017-07-20 DIAGNOSIS — C7989 Secondary malignant neoplasm of other specified sites: Secondary | ICD-10-CM | POA: Diagnosis not present

## 2017-07-20 DIAGNOSIS — Z9221 Personal history of antineoplastic chemotherapy: Secondary | ICD-10-CM | POA: Diagnosis not present

## 2017-07-20 DIAGNOSIS — S31104A Unspecified open wound of abdominal wall, left lower quadrant without penetration into peritoneal cavity, initial encounter: Secondary | ICD-10-CM | POA: Diagnosis not present

## 2017-07-20 DIAGNOSIS — D573 Sickle-cell trait: Secondary | ICD-10-CM | POA: Diagnosis not present

## 2017-07-20 DIAGNOSIS — S31104D Unspecified open wound of abdominal wall, left lower quadrant without penetration into peritoneal cavity, subsequent encounter: Secondary | ICD-10-CM | POA: Insufficient documentation

## 2017-07-20 DIAGNOSIS — Z86718 Personal history of other venous thrombosis and embolism: Secondary | ICD-10-CM | POA: Diagnosis not present

## 2017-07-20 DIAGNOSIS — Z923 Personal history of irradiation: Secondary | ICD-10-CM | POA: Diagnosis not present

## 2017-07-20 DIAGNOSIS — Z7901 Long term (current) use of anticoagulants: Secondary | ICD-10-CM | POA: Diagnosis not present

## 2017-07-20 DIAGNOSIS — X58XXXA Exposure to other specified factors, initial encounter: Secondary | ICD-10-CM | POA: Diagnosis not present

## 2017-07-20 DIAGNOSIS — F17200 Nicotine dependence, unspecified, uncomplicated: Secondary | ICD-10-CM | POA: Diagnosis not present

## 2017-07-23 LAB — AEROBIC CULTURE W GRAM STAIN (SUPERFICIAL SPECIMEN)

## 2017-07-23 LAB — AEROBIC CULTURE  (SUPERFICIAL SPECIMEN)

## 2017-07-24 ENCOUNTER — Encounter: Payer: Self-pay | Admitting: Pharmacy Technician

## 2017-07-24 NOTE — Progress Notes (Signed)
The patient is approved for drug assistance by [employer] for [facility] based on Off Label use. Enrollment is from 07/14/17 - 07/25/18. Drug will be replaced for DOS 07/19/17 and subsequent treatments.

## 2017-07-27 DIAGNOSIS — S31104A Unspecified open wound of abdominal wall, left lower quadrant without penetration into peritoneal cavity, initial encounter: Secondary | ICD-10-CM | POA: Diagnosis not present

## 2017-08-03 DIAGNOSIS — S31104A Unspecified open wound of abdominal wall, left lower quadrant without penetration into peritoneal cavity, initial encounter: Secondary | ICD-10-CM | POA: Diagnosis not present

## 2017-08-07 ENCOUNTER — Other Ambulatory Visit: Payer: Self-pay | Admitting: *Deleted

## 2017-08-07 DIAGNOSIS — N485 Ulcer of penis: Secondary | ICD-10-CM

## 2017-08-07 DIAGNOSIS — C609 Malignant neoplasm of penis, unspecified: Secondary | ICD-10-CM

## 2017-08-07 MED ORDER — OXYCODONE-ACETAMINOPHEN 5-325 MG PO TABS: 2.0000 | ORAL_TABLET | ORAL | 0 refills | Status: DC | PRN

## 2017-08-08 ENCOUNTER — Telehealth: Payer: Self-pay | Admitting: *Deleted

## 2017-08-08 NOTE — Telephone Encounter (Signed)
Called patient and told him his prescription was ready for pickup.

## 2017-08-10 ENCOUNTER — Inpatient Hospital Stay: Payer: Medicaid Other

## 2017-08-10 ENCOUNTER — Encounter: Payer: Self-pay | Admitting: Oncology

## 2017-08-10 ENCOUNTER — Inpatient Hospital Stay: Payer: Medicaid Other | Attending: Oncology

## 2017-08-10 ENCOUNTER — Inpatient Hospital Stay (HOSPITAL_BASED_OUTPATIENT_CLINIC_OR_DEPARTMENT_OTHER): Payer: Medicaid Other | Admitting: Oncology

## 2017-08-10 VITALS — BP 131/73 | HR 123 | Temp 98.8°F | Resp 18 | Ht 69.0 in | Wt 185.7 lb

## 2017-08-10 DIAGNOSIS — C609 Malignant neoplasm of penis, unspecified: Secondary | ICD-10-CM | POA: Insufficient documentation

## 2017-08-10 DIAGNOSIS — Z923 Personal history of irradiation: Secondary | ICD-10-CM | POA: Diagnosis not present

## 2017-08-10 DIAGNOSIS — Z7189 Other specified counseling: Secondary | ICD-10-CM

## 2017-08-10 DIAGNOSIS — G8929 Other chronic pain: Secondary | ICD-10-CM | POA: Diagnosis not present

## 2017-08-10 DIAGNOSIS — I82622 Acute embolism and thrombosis of deep veins of left upper extremity: Secondary | ICD-10-CM

## 2017-08-10 DIAGNOSIS — Z5111 Encounter for antineoplastic chemotherapy: Secondary | ICD-10-CM | POA: Insufficient documentation

## 2017-08-10 DIAGNOSIS — D500 Iron deficiency anemia secondary to blood loss (chronic): Secondary | ICD-10-CM | POA: Diagnosis not present

## 2017-08-10 DIAGNOSIS — C772 Secondary and unspecified malignant neoplasm of intra-abdominal lymph nodes: Secondary | ICD-10-CM

## 2017-08-10 DIAGNOSIS — Z7901 Long term (current) use of anticoagulants: Secondary | ICD-10-CM

## 2017-08-10 LAB — CBC WITH DIFFERENTIAL/PLATELET
Basophils Absolute: 0.1 10*3/uL (ref 0.0–0.1)
Basophils Relative: 0 %
Eosinophils Absolute: 0.6 10*3/uL — ABNORMAL HIGH (ref 0.0–0.5)
Eosinophils Relative: 4 %
HEMATOCRIT: 25.1 % — AB (ref 38.4–49.9)
HEMOGLOBIN: 8.3 g/dL — AB (ref 13.0–17.1)
LYMPHS ABS: 0.7 10*3/uL — AB (ref 0.9–3.3)
LYMPHS PCT: 5 %
MCH: 26.3 pg — AB (ref 27.2–33.4)
MCHC: 33 g/dL (ref 32.0–36.0)
MCV: 79.7 fL (ref 79.3–98.0)
MONOS PCT: 6 %
Monocytes Absolute: 0.8 10*3/uL (ref 0.1–0.9)
NEUTROS ABS: 12.6 10*3/uL — AB (ref 1.5–6.5)
Neutrophils Relative %: 85 %
Platelets: 417 10*3/uL — ABNORMAL HIGH (ref 140–400)
RBC: 3.16 MIL/uL — ABNORMAL LOW (ref 4.20–5.82)
RDW: 17.6 % — ABNORMAL HIGH (ref 11.0–14.6)
WBC: 14.8 10*3/uL — ABNORMAL HIGH (ref 4.0–10.3)

## 2017-08-10 LAB — COMPREHENSIVE METABOLIC PANEL
ALT: 17 U/L (ref 0–55)
AST: 18 U/L (ref 5–34)
Albumin: 2.8 g/dL — ABNORMAL LOW (ref 3.5–5.0)
Alkaline Phosphatase: 81 U/L (ref 40–150)
Anion gap: 6 (ref 3–11)
BUN: 13 mg/dL (ref 7–26)
CHLORIDE: 107 mmol/L (ref 98–109)
CO2: 26 mmol/L (ref 22–29)
CREATININE: 1.37 mg/dL — AB (ref 0.70–1.30)
Calcium: 9.4 mg/dL (ref 8.4–10.4)
GFR calc Af Amer: >60 mL/min (ref >60)
GFR calc non Af Amer: >60 mL/min (ref >60)
Glucose, Bld: 111 mg/dL (ref 70–140)
POTASSIUM: 3.8 mmol/L (ref 3.5–5.1)
SODIUM: 139 mmol/L (ref 136–145)
Total Bilirubin: <0.2 mg/dL — ABNORMAL LOW (ref 0.2–1.2)
Total Protein: 7 g/dL (ref 6.4–8.3)

## 2017-08-10 MED ORDER — SODIUM CHLORIDE 0.9% FLUSH
10.0000 mL | INTRAVENOUS | Status: DC | PRN
  Administered 2017-08-10: 10 mL
  Filled 2017-08-10: qty 10

## 2017-08-10 MED ORDER — SODIUM CHLORIDE 0.9 % IV SOLN
Freq: Once | INTRAVENOUS | Status: AC
  Administered 2017-08-10: 12:00:00 via INTRAVENOUS

## 2017-08-10 MED ORDER — HEPARIN SOD (PORK) LOCK FLUSH 100 UNIT/ML IV SOLN
500.0000 [IU] | Freq: Once | INTRAVENOUS | Status: AC | PRN
  Administered 2017-08-10: 500 [IU]
  Filled 2017-08-10: qty 5

## 2017-08-10 MED ORDER — SODIUM CHLORIDE 0.9 % IV SOLN
200.0000 mg | Freq: Once | INTRAVENOUS | Status: AC
  Administered 2017-08-10: 200 mg via INTRAVENOUS
  Filled 2017-08-10: qty 8

## 2017-08-10 NOTE — Patient Instructions (Signed)
[facility] Discharge Instructions for Patients Receiving Chemotherapy  Today you received the following chemotherapy agent: Keytruda  To help prevent nausea and vomiting after your treatment, we encourage you to take your nausea medication as prescribed   If you develop nausea and vomiting that is not controlled by your nausea medication, call the clinic.   BELOW ARE SYMPTOMS THAT SHOULD BE REPORTED IMMEDIATELY:  *FEVER GREATER THAN 100.5 F  *CHILLS WITH OR WITHOUT FEVER  NAUSEA AND VOMITING THAT IS NOT CONTROLLED WITH YOUR NAUSEA MEDICATION  *UNUSUAL SHORTNESS OF BREATH  *UNUSUAL BRUISING OR BLEEDING  TENDERNESS IN MOUTH AND THROAT WITH OR WITHOUT PRESENCE OF ULCERS  *URINARY PROBLEMS  *BOWEL PROBLEMS  UNUSUAL RASH Items with * indicate a potential emergency and should be followed up as soon as possible.  Feel free to call the clinic should you have any questions or concerns. The clinic phone number is [phone].  Please show the CHEMO ALERT CARD at check-in to the Emergency Department and triage nurse.   

## 2017-08-10 NOTE — Patient Instructions (Signed)
Implanted Port Home Guide An implanted port is a type of central line that is placed under the skin. Central lines are used to provide IV access when treatment or nutrition needs to be given through a person's veins. Implanted ports are used for long-term IV access. An implanted port may be placed because:  You need IV medicine that would be irritating to the small veins in your hands or arms.  You need long-term IV medicines, such as antibiotics.  You need IV nutrition for a long period.  You need frequent blood draws for lab tests.  You need dialysis.  Implanted ports are usually placed in the chest area, but they can also be placed in the upper arm, the abdomen, or the leg. An implanted port has two main parts:  Reservoir. The reservoir is round and will appear as a small, raised area under your skin. The reservoir is the part where a needle is inserted to give medicines or draw blood.  Catheter. The catheter is a thin, flexible tube that extends from the reservoir. The catheter is placed into a large vein. Medicine that is inserted into the reservoir goes into the catheter and then into the vein.  How will I care for my incision site? Do not get the incision site wet. Bathe or shower as directed by your health care provider. How is my port accessed? Special steps must be taken to access the port:  Before the port is accessed, a numbing cream can be placed on the skin. This helps numb the skin over the port site.  Your health care provider uses a sterile technique to access the port. ? Your health care provider must put on a mask and sterile gloves. ? The skin over your port is cleaned carefully with an antiseptic and allowed to dry. ? The port is gently pinched between sterile gloves, and a needle is inserted into the port.  Only "non-coring" port needles should be used to access the port. Once the port is accessed, a blood return should be checked. This helps ensure that the port  is in the vein and is not clogged.  If your port needs to remain accessed for a constant infusion, a clear (transparent) bandage will be placed over the needle site. The bandage and needle will need to be changed every week, or as directed by your health care provider.  Keep the bandage covering the needle clean and dry. Do not get it wet. Follow your health care provider's instructions on how to take a shower or bath while the port is accessed.  If your port does not need to stay accessed, no bandage is needed over the port.  What is flushing? Flushing helps keep the port from getting clogged. Follow your health care provider's instructions on how and when to flush the port. Ports are usually flushed with saline solution or a medicine called heparin. The need for flushing will depend on how the port is used.  If the port is used for intermittent medicines or blood draws, the port will need to be flushed: ? After medicines have been given. ? After blood has been drawn. ? As part of routine maintenance.  If a constant infusion is running, the port may not need to be flushed.  How long will my port stay implanted? The port can stay in for as long as your health care provider thinks it is needed. When it is time for the port to come out, surgery will be   done to remove it. The procedure is similar to the one performed when the port was put in. When should I seek immediate medical care? When you have an implanted port, you should seek immediate medical care if:  You notice a bad smell coming from the incision site.  You have swelling, redness, or drainage at the incision site.  You have more swelling or pain at the port site or the surrounding area.  You have a fever that is not controlled with medicine.  This information is not intended to replace advice given to you by your health care provider. Make sure you discuss any questions you have with your health care provider. Document  Released: 03/28/2005 Document Revised: 09/03/2015 Document Reviewed: 12/03/2012 Elsevier Interactive Patient Education  2017 [employer]  

## 2017-08-10 NOTE — Progress Notes (Signed)
[facility] OFFICE PROGRESS NOTE  [name], FNP 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  His tumor showed a positive PDL 1 expression  PRIOR THERAPY: He is status post a biopsy of his lymphadenopathy in August 2018.  He is S/P cisplatin and 5-FU palliative chemotherapy.  He completed 3 cycles in November 2018 and developed progression of disease.  He is status post palliative radiation therapy between April 05, 2017 and April 20, 2017.  He received 30 Gy in 10 fractions to the pelvis and the periaortic areas.  He developed left upper extremity deep vein thrombosis and was started on Xarelto.  CURRENT THERAPY: Keytruda 200 mg IV given every 3 weeks.  Started on 07/19/2017.  Status post 1 cycle.  INTERVAL HISTORY: Ronald Wilson 42 y.o. male returns for routine follow-up visit by himself.  The patient is feeling fine today and has no specific complaints except for ongoing pain.  He uses Percocet as needed.  He received his first cycle of Keytruda and tolerated it fairly well.  The patient denies fevers and chills.  Denies chest pain, shortness of breath, cough, hemoptysis.  Denies nausea, vomiting, constipation, diarrhea.  The patient continues to follow-up with the wound center for his open pelvic wound.  Denies bleeding.  Denies urinary frequency, hematuria, dysuria.  Denies skin rashes and lesions.  Denies recent weight loss or night sweats.  Patient is here for evaluation prior to cycle 2 of Keytruda.  MEDICAL HISTORY: Past Medical History:  Diagnosis Date  . Hypertension   . Inguinal mass 11/17/2016   left  . Sickle cell trait ([place])   . squamous cell penile ca dx'd 11/2015    ALLERGIES:  has No Known Allergies.  MEDICATIONS:  Current Outpatient Medications  Medication Sig Dispense Refill  . amLODipine  (NORVASC) 10 MG tablet Take 1 tablet (10 mg total) by mouth daily. 30 tablet 5  . lidocaine-prilocaine (EMLA) cream Apply 1 application topically as needed. Apply to porta cath before chemotherapy. 30 g 0  . oxyCODONE-acetaminophen (PERCOCET/ROXICET) 5-325 MG tablet Take 2 tablets by mouth every 4 (four) hours as needed for severe pain. 120 tablet 0  . prochlorperazine (COMPAZINE) 10 MG tablet Take 1 tablet (10 mg total) by mouth every 6 (six) hours as needed for nausea or vomiting. 30 tablet 0   No current facility-administered medications for this visit.    Facility-Administered Medications Ordered in Other Visits  Medication Dose Route Frequency Provider Last Rate Last Dose  . heparin lock flush 100 unit/mL  500 Units Intracatheter Once PRN Wyatt Portela, MD      . sodium chloride flush (NS) 0.9 % injection 10 mL  10 mL Intracatheter PRN Wyatt Portela, MD      . sodium chloride flush (NS) 0.9 % injection 10 mL  10 mL Intracatheter PRN Wyatt Portela, MD   10 mL at 08/10/17 1342    SURGICAL HISTORY:  Past Surgical History:  Procedure Laterality Date  . IR FLUORO GUIDE PORT INSERTION RIGHT  12/26/2016  . IR US GUIDE VASC ACCESS RIGHT  12/26/2016  . NO PAST SURGERIES      REVIEW OF SYSTEMS:   Review of Systems  Constitutional: Negative for appetite change, chills, fatigue, fever and unexpected weight change.  HENT:   Negative for mouth sores, nosebleeds, sore throat and trouble  swallowing.   Eyes: Negative for eye problems and icterus.  Respiratory: Negative for cough, hemoptysis, shortness of breath and wheezing.   Cardiovascular: Negative for chest pain and leg swelling.  Gastrointestinal: Negative for abdominal pain, constipation, diarrhea, nausea and vomiting.  Genitourinary: Negative for bladder incontinence, difficulty urinating, dysuria, frequency and hematuria.   Musculoskeletal: Negative for back pain, gait problem, neck pain and neck stiffness. Positive for pelvic  pain. Skin: Negative for itching and rash. Positive for open wound to his pelvic area.  He is followed by the wound center. Neurological: Negative for dizziness, extremity weakness, gait problem, headaches, light-headedness and seizures.  Hematological: Negative for adenopathy. Does not bruise/bleed easily.  Psychiatric/Behavioral: Negative for confusion, depression and sleep disturbance. The patient is not nervous/anxious.     PHYSICAL EXAMINATION:  Blood pressure 131/73, pulse (!) 123, temperature 98.8 F (37.1 C), temperature source Oral, resp. rate 18, height 5\' 9"  (1.753 m), weight 185 lb 11.2 oz (84.2 kg), SpO2 100 %.  ECOG PERFORMANCE STATUS: 1 - Symptomatic but completely ambulatory  Physical Exam  Constitutional: Oriented to person, place, and time and well-developed, well-nourished, and in no distress. No distress.  HENT:  Head: Normocephalic and atraumatic.  Mouth/Throat: Oropharynx is clear and moist. No oropharyngeal exudate.  Eyes: Conjunctivae are normal. Right eye exhibits no discharge. Left eye exhibits no discharge. No scleral icterus.  Neck: Normal range of motion. Neck supple.  Cardiovascular: Normal rate, regular rhythm, normal heart sounds and intact distal pulses.   Pulmonary/Chest: Effort normal and breath sounds normal. No respiratory distress. No wheezes. No rales.  Abdominal: Soft. Bowel sounds are normal. Exhibits no distension and no mass. There is no tenderness.  Musculoskeletal: Normal range of motion. Exhibits no edema.  Lymphadenopathy:    No cervical adenopathy.  Neurological: Alert and oriented to person, place, and time. Exhibits normal muscle tone. Gait normal. Coordination normal.  Skin: Skin is warm and dry. No rash noted. Not diaphoretic. No erythema. No pallor.  Psychiatric: Mood, memory and judgment normal.  Vitals reviewed.  LABORATORY DATA: Lab Results  Component Value Date   WBC 14.8 (H) 08/10/2017   HGB 8.3 (L) 08/10/2017   HCT 25.1  (L) 08/10/2017   MCV 79.7 08/10/2017   PLT 417 (H) 08/10/2017      Chemistry      Component Value Date/Time   NA 139 08/10/2017 1058   NA 140 03/13/2017 0827   K 3.8 08/10/2017 1058   K 3.6 03/13/2017 0827   CL 107 08/10/2017 1058   CO2 26 08/10/2017 1058   CO2 28 03/13/2017 0827   BUN 13 08/10/2017 1058   BUN 15.6 03/13/2017 0827   CREATININE 1.37 (H) 08/10/2017 1058   CREATININE 1.82 (H) 06/06/2017 1149   CREATININE 2.0 (H) 03/13/2017 0827      Component Value Date/Time   CALCIUM 9.4 08/10/2017 1058   CALCIUM 10.0 03/13/2017 0827   ALKPHOS 81 08/10/2017 1058   ALKPHOS 78 03/13/2017 0827   AST 18 08/10/2017 1058   AST 14 06/06/2017 1149   AST 15 03/13/2017 0827   ALT 17 08/10/2017 1058   ALT 9 06/06/2017 1149   ALT 10 03/13/2017 0827   BILITOT <0.2 (L) 08/10/2017 1058   BILITOT 0.3 06/06/2017 1149   BILITOT 0.22 03/13/2017 0827       RADIOGRAPHIC STUDIES:  No results found.   ASSESSMENT/PLAN:  43 year old gentleman with the following issues:  1.  Metastatic squamous cell carcinoma involving retroperitoneal and pelvic adenopathy arising from  the penis diagnosed in August 2018.  He has progressed on 5-FU and cisplatin indicating chemo refractory disease.  Repeat biopsy obtained on June 20, 2017 of the inguinal lymph node which showed squamous cell carcinoma with extensive necrosis. His PDL 1 testing showed total proportion score of 1%.  The patient is on Keytruda and tolerated the first cycle well.  Recommend that he proceed with cycle 2 as scheduled today.  2. IV access: Port-A-Cath will be used for his infusion.  3. Renal function surveillance: His creatinine has improved to 1.37.  5.  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  He is status post transfusion in the past and his hemoglobin currently at 8.3.  6.  Left upper extremity deep vein thrombosis: Xarelto has been discontinued because of bleeding.  7.  Prognosis: Poor with  limited life expectancy.  His performance status remains adequate at this time and aggressive therapy is warranted.  8. Follow-up: In 3 weeks for evaluation prior to cycle #3 of his treatment.  No orders of the defined types were placed in this encounter.  Mikey Bussing, DNP, AGPCNP-BC, AOCNP 08/10/17

## 2017-08-17 ENCOUNTER — Encounter (HOSPITAL_BASED_OUTPATIENT_CLINIC_OR_DEPARTMENT_OTHER): Payer: Medicaid Other | Attending: Internal Medicine

## 2017-08-17 ENCOUNTER — Other Ambulatory Visit (HOSPITAL_COMMUNITY)
Admit: 2017-08-17 | Discharge: 2017-08-17 | Disposition: A | Discharge status: Home / Self Care | Payer: Medicaid Other | Source: Ambulatory Visit | Attending: Internal Medicine | Admitting: Internal Medicine

## 2017-08-17 DIAGNOSIS — Z86718 Personal history of other venous thrombosis and embolism: Secondary | ICD-10-CM | POA: Insufficient documentation

## 2017-08-17 DIAGNOSIS — S31104A Unspecified open wound of abdominal wall, left lower quadrant without penetration into peritoneal cavity, initial encounter: Secondary | ICD-10-CM | POA: Diagnosis present

## 2017-08-17 DIAGNOSIS — Z79899 Other long term (current) drug therapy: Secondary | ICD-10-CM | POA: Diagnosis not present

## 2017-08-17 DIAGNOSIS — S31104D Unspecified open wound of abdominal wall, left lower quadrant without penetration into peritoneal cavity, subsequent encounter: Secondary | ICD-10-CM | POA: Insufficient documentation

## 2017-08-17 DIAGNOSIS — X58XXXA Exposure to other specified factors, initial encounter: Secondary | ICD-10-CM | POA: Insufficient documentation

## 2017-08-17 DIAGNOSIS — C601 Malignant neoplasm of glans penis: Secondary | ICD-10-CM | POA: Diagnosis not present

## 2017-08-17 DIAGNOSIS — R509 Fever, unspecified: Secondary | ICD-10-CM | POA: Insufficient documentation

## 2017-08-17 DIAGNOSIS — X58XXXD Exposure to other specified factors, subsequent encounter: Secondary | ICD-10-CM | POA: Insufficient documentation

## 2017-08-17 DIAGNOSIS — D571 Sickle-cell disease without crisis: Secondary | ICD-10-CM | POA: Insufficient documentation

## 2017-08-17 DIAGNOSIS — C7989 Secondary malignant neoplasm of other specified sites: Secondary | ICD-10-CM | POA: Insufficient documentation

## 2017-08-17 DIAGNOSIS — Z923 Personal history of irradiation: Secondary | ICD-10-CM | POA: Diagnosis not present

## 2017-08-20 LAB — AEROBIC CULTURE  (SUPERFICIAL SPECIMEN)

## 2017-08-20 LAB — AEROBIC CULTURE W GRAM STAIN (SUPERFICIAL SPECIMEN)

## 2017-08-25 ENCOUNTER — Other Ambulatory Visit: Payer: Self-pay | Admitting: *Deleted

## 2017-08-25 DIAGNOSIS — N485 Ulcer of penis: Secondary | ICD-10-CM

## 2017-08-25 DIAGNOSIS — C609 Malignant neoplasm of penis, unspecified: Secondary | ICD-10-CM

## 2017-08-25 DIAGNOSIS — S31104A Unspecified open wound of abdominal wall, left lower quadrant without penetration into peritoneal cavity, initial encounter: Secondary | ICD-10-CM | POA: Diagnosis not present

## 2017-08-25 MED ORDER — OXYCODONE-ACETAMINOPHEN 5-325 MG PO TABS: 2.0000 | ORAL_TABLET | ORAL | 0 refills | Status: DC | PRN

## 2017-08-31 ENCOUNTER — Inpatient Hospital Stay: Payer: Medicaid Other

## 2017-08-31 ENCOUNTER — Inpatient Hospital Stay (HOSPITAL_BASED_OUTPATIENT_CLINIC_OR_DEPARTMENT_OTHER): Payer: Medicaid Other | Admitting: Oncology

## 2017-08-31 ENCOUNTER — Telehealth: Payer: Self-pay

## 2017-08-31 VITALS — BP 128/71 | HR 129 | Temp 98.4°F | Resp 18 | Ht 69.0 in | Wt 183.6 lb

## 2017-08-31 DIAGNOSIS — D649 Anemia, unspecified: Secondary | ICD-10-CM

## 2017-08-31 DIAGNOSIS — Z7901 Long term (current) use of anticoagulants: Secondary | ICD-10-CM

## 2017-08-31 DIAGNOSIS — C609 Malignant neoplasm of penis, unspecified: Secondary | ICD-10-CM

## 2017-08-31 DIAGNOSIS — C772 Secondary and unspecified malignant neoplasm of intra-abdominal lymph nodes: Secondary | ICD-10-CM | POA: Diagnosis not present

## 2017-08-31 DIAGNOSIS — D500 Iron deficiency anemia secondary to blood loss (chronic): Secondary | ICD-10-CM

## 2017-08-31 DIAGNOSIS — I82622 Acute embolism and thrombosis of deep veins of left upper extremity: Secondary | ICD-10-CM

## 2017-08-31 DIAGNOSIS — G8929 Other chronic pain: Secondary | ICD-10-CM | POA: Diagnosis not present

## 2017-08-31 DIAGNOSIS — Z95828 Presence of other vascular implants and grafts: Secondary | ICD-10-CM

## 2017-08-31 DIAGNOSIS — Z7189 Other specified counseling: Secondary | ICD-10-CM

## 2017-08-31 DIAGNOSIS — Z5111 Encounter for antineoplastic chemotherapy: Secondary | ICD-10-CM | POA: Diagnosis not present

## 2017-08-31 LAB — COMPREHENSIVE METABOLIC PANEL
ALT: 9 U/L (ref 0–55)
AST: 12 U/L (ref 5–34)
Albumin: 2.8 g/dL — ABNORMAL LOW (ref 3.5–5.0)
Alkaline Phosphatase: 82 U/L (ref 40–150)
Anion gap: 7 (ref 3–11)
BUN: 12 mg/dL (ref 7–26)
CO2: 27 mmol/L (ref 22–29)
CREATININE: 1.43 mg/dL — AB (ref 0.70–1.30)
Calcium: 10 mg/dL (ref 8.4–10.4)
Chloride: 103 mmol/L (ref 98–109)
GFR, EST NON AFRICAN AMERICAN: 60 mL/min — AB (ref >60)
Glucose, Bld: 138 mg/dL (ref 70–140)
POTASSIUM: 4 mmol/L (ref 3.5–5.1)
Sodium: 137 mmol/L (ref 136–145)
TOTAL PROTEIN: 7.5 g/dL (ref 6.4–8.3)

## 2017-08-31 LAB — CBC WITH DIFFERENTIAL/PLATELET
BASOS ABS: 0.1 10*3/uL (ref 0.0–0.1)
Basophils Relative: 1 %
EOS PCT: 4 %
Eosinophils Absolute: 0.5 10*3/uL (ref 0.0–0.5)
HCT: 24.7 % — ABNORMAL LOW (ref 38.4–49.9)
HEMOGLOBIN: 7.8 g/dL — AB (ref 13.0–17.1)
LYMPHS ABS: 1.1 10*3/uL (ref 0.9–3.3)
Lymphocytes Relative: 8 %
MCH: 25.2 pg — AB (ref 27.2–33.4)
MCHC: 31.6 g/dL — ABNORMAL LOW (ref 32.0–36.0)
MCV: 79.9 fL (ref 79.3–98.0)
MONOS PCT: 6 %
Monocytes Absolute: 0.8 10*3/uL (ref 0.1–0.9)
Neutro Abs: 11.1 10*3/uL — ABNORMAL HIGH (ref 1.5–6.5)
Neutrophils Relative %: 81 %
PLATELETS: 568 10*3/uL — AB (ref 140–400)
RBC: 3.09 MIL/uL — AB (ref 4.20–5.82)
RDW: 17.7 % — ABNORMAL HIGH (ref 11.0–14.6)
WBC: 13.6 10*3/uL — ABNORMAL HIGH (ref 4.0–10.3)

## 2017-08-31 MED ORDER — SODIUM CHLORIDE 0.9 % IV SOLN
Freq: Once | INTRAVENOUS | Status: AC
  Administered 2017-08-31: 13:00:00 via INTRAVENOUS

## 2017-08-31 MED ORDER — SODIUM CHLORIDE 0.9% FLUSH
10.0000 mL | INTRAVENOUS | Status: DC | PRN
  Administered 2017-08-31: 10 mL
  Filled 2017-08-31: qty 10

## 2017-08-31 MED ORDER — SODIUM CHLORIDE 0.9 % IV SOLN
200.0000 mg | Freq: Once | INTRAVENOUS | Status: AC
  Administered 2017-08-31: 200 mg via INTRAVENOUS
  Filled 2017-08-31: qty 8

## 2017-08-31 MED ORDER — HEPARIN SOD (PORK) LOCK FLUSH 100 UNIT/ML IV SOLN
500.0000 [IU] | Freq: Once | INTRAVENOUS | Status: AC | PRN
  Administered 2017-08-31: 500 [IU]
  Filled 2017-08-31: qty 5

## 2017-08-31 NOTE — Progress Notes (Signed)
Ok to treat with Hgb 7.8 per Dr Alen Blew

## 2017-08-31 NOTE — Progress Notes (Signed)
Hematology and Oncology Follow Up Visit  Ronald Wilson [id number] 05-10-1975 42 y.o. 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  His tumor showed a positive PDL 1 expression   Prior Therapy:   He is status post a biopsy of his lymphadenopathy in August 2018.  He is S/P cisplatin and 5-FU palliative chemotherapy.  He completed 3 cycles in November 2018 and developed progression of disease.  He is status post palliative radiation therapy between April 05, 2017 and April 20, 2017.  He received 30 Gy in 10 fractions to the pelvis and the periaortic areas.  He developed left upper extremity deep vein thrombosis and was started on Xarelto.  Current therapy:  Pembrolizumab 200 mg every 3 weeks started on July 19, 2017.  He is here for cycle 3 of therapy.   Interim History: [name] is here for a follow-up visit.  Since the last visit, he reports no major changes in his health.  He is inguinal adenopathy appears to be relatively stable.  He denies any active bleeding from the penis or any other wounds.  His performance status and quality of life remains unchanged.  He continues to tolerate Pembrolizumab without any complications.  He denies any excessive fatigue, tiredness or pruritus.  He denies any respiratory symptoms.  His bowel movements remain regular without any diarrhea.  He continues to have chronic pain issues related to his painful adenopathy.  He does take Percocet which is effective in controlling his pain.  He takes 1 to 2 tablet every 6 hours at this time.  He reports his pain is predominantly nagging without any radiation.  No neurological deficits at this time.   He does not report any headaches, blurry vision, syncope or seizures.  He denies any alteration in mental status.  He does not report any fevers,  chills or sweats.  He does not report any cough, wheezing or hemoptysis.  He does not report any chest pain, palpitation.  He does not report any nausea vomiting or abdominal pain. He does not report any constipation, diarrhea or over the satiety. He does not report any hematochezia or melena. He does not report any hematuria or dysuria.  He does not report any frequency urgency or hesitancy.  He does not report any pathological fractures or bone pain.  He does not report any skin rashes or lesions.  Remaining review of systems is negative.  Medications: I have reviewed the patient's current medications.  Current Outpatient Medications  Medication Sig Dispense Refill  . amLODipine (NORVASC) 10 MG tablet Take 1 tablet (10 mg total) by mouth daily. 30 tablet 5  . lidocaine-prilocaine (EMLA) cream Apply 1 application topically as needed. Apply to porta cath before chemotherapy. 30 g 0  . oxyCODONE-acetaminophen (PERCOCET/ROXICET) 5-325 MG tablet Take 2 tablets by mouth every 4 (four) hours as needed for severe pain. 120 tablet 0  . prochlorperazine (COMPAZINE) 10 MG tablet Take 1 tablet (10 mg total) by mouth every 6 (six) hours as needed for nausea or vomiting. 30 tablet 0   No current facility-administered medications for this visit.    Facility-Administered Medications Ordered in Other Visits  Medication Dose Route Frequency Provider Last Rate Last Dose  . heparin lock flush 100 unit/mL  500 Units Intracatheter Once PRN Wyatt Portela, MD      . sodium chloride flush (NS) 0.9 %  injection 10 mL  10 mL Intracatheter PRN Wyatt Portela, MD         Allergies: No Known Allergies  Past Medical History, Surgical history, Social history, and Family History reviewed today and unchanged.  [name] Physical Exam: Blood pressure 128/71, pulse (!) 129, temperature 98.4 F (36.9 C), temperature source Oral, resp. rate 18, height 5\' 9"  (1.753 m), weight 183 lb 9.6 oz (83.3 kg), SpO2 99 %.   ECOG: 0 General  appearance: Alert, awake gentleman appeared comfortable. Head: Normocephalic without any abnormalities. Oropharynx: Next membranes are moist and pink. Lymph nodes: Palpable inguinal adenopathy left more than the right.  Appears to be stable in size. heart: Regular rate without any murmurs or gallops. Lung: Clear without any rhonchi, wheezes or dullness to percussion. Abdomin: Soft, without any rebound or guarding.  No shifting dullness or ascites. GU exam: No bleeding noted on exam.  Penile lesion noted unchanged. Musculoskeletal: No clubbing or cyanosis.  Left leg edema noted. Skin: No petechia or ecchymosis. Neurological: No motor or sensory deficits.   Lab Results: Lab Results  Component Value Date   WBC 13.6 (H) 08/31/2017   HGB 7.8 (L) 08/31/2017   HCT 24.7 (L) 08/31/2017   MCV 79.9 08/31/2017   PLT 568 (H) 08/31/2017     Chemistry      Component Value Date/Time   NA 139 08/10/2017 1058   NA 140 03/13/2017 0827   K 3.8 08/10/2017 1058   K 3.6 03/13/2017 0827   CL 107 08/10/2017 1058   CO2 26 08/10/2017 1058   CO2 28 03/13/2017 0827   BUN 13 08/10/2017 1058   BUN 15.6 03/13/2017 0827   CREATININE 1.37 (H) 08/10/2017 1058   CREATININE 1.82 (H) 06/06/2017 1149   CREATININE 2.0 (H) 03/13/2017 0827      Component Value Date/Time   CALCIUM 9.4 08/10/2017 1058   CALCIUM 10.0 03/13/2017 0827   ALKPHOS 81 08/10/2017 1058   ALKPHOS 78 03/13/2017 0827   AST 18 08/10/2017 1058   AST 14 06/06/2017 1149   AST 15 03/13/2017 0827   ALT 17 08/10/2017 1058   ALT 9 06/06/2017 1149   ALT 10 03/13/2017 0827   BILITOT <0.2 (L) 08/10/2017 1058   BILITOT 0.3 06/06/2017 1149   BILITOT 0.22 03/13/2017 0827       Impression and Plan:  42 year old man with the:  1. 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  His tumor is PDL 1+ and currently receiving Pembrolizumab.  He tolerated the last 2 cycles  without any complications and ready to proceed with the third cycle.  The natural course of this disease as well as long-term complication associated with this therapy were reviewed today and is agreeable to continue.  We will obtain imaging studies in July 2019 for staging purposes.  In the meantime we will continue the current dose and schedule.   2. IV access: Port-A-Cath remains in use without any complications.  3. Renal function surveillance: His kidney function remains stable with creatinine not dramatically changed.  5.  Anemia: Related to chronic blood losses and malignancy.  I will repeat iron studies and consider intravenous iron if iron is remains low.  6.  Left upper extremity deep vein thrombosis: He is off anticoagulation at this time.  No recurrent thrombosis noted.  7.  Prognosis: He has an incurable malignancy with poor prognosis.  His performance status remains stable and aggressive therapy is  warranted.  8. Follow-up: Will be 3 weeks for the next cycle of therapy.  25  minutes was spent with the patient face-to-face today.  More than 50% of time was dedicated to patient counseling, education and discussing future plan of care.   Zola Button, MD 5/23/201912:18 PM

## 2017-08-31 NOTE — Telephone Encounter (Signed)
Printed avs and calender of upcoming appointment. Per Bush requested date and times. Per 5/23 los

## 2017-08-31 NOTE — Patient Instructions (Signed)
[facility] Discharge Instructions for Patients Receiving Chemotherapy  Today you received the following chemotherapy agents keytruda   To help prevent nausea and vomiting after your treatment, we encourage you to take your nausea medication as directed  If you develop nausea and vomiting that is not controlled by your nausea medication, call the clinic.   BELOW ARE SYMPTOMS THAT SHOULD BE REPORTED IMMEDIATELY:  *FEVER GREATER THAN 100.5 F  *CHILLS WITH OR WITHOUT FEVER  NAUSEA AND VOMITING THAT IS NOT CONTROLLED WITH YOUR NAUSEA MEDICATION  *UNUSUAL SHORTNESS OF BREATH  *UNUSUAL BRUISING OR BLEEDING  TENDERNESS IN MOUTH AND THROAT WITH OR WITHOUT PRESENCE OF ULCERS  *URINARY PROBLEMS  *BOWEL PROBLEMS  UNUSUAL RASH Items with * indicate a potential emergency and should be followed up as soon as possible.  Feel free to call the clinic you have any questions or concerns. The clinic phone number is [phone].  

## 2017-09-08 ENCOUNTER — Other Ambulatory Visit: Payer: Self-pay | Admitting: Oncology

## 2017-09-08 DIAGNOSIS — N485 Ulcer of penis: Secondary | ICD-10-CM

## 2017-09-08 DIAGNOSIS — C609 Malignant neoplasm of penis, unspecified: Secondary | ICD-10-CM

## 2017-09-08 MED ORDER — OXYCODONE-ACETAMINOPHEN 5-325 MG PO TABS: 2.0000 | ORAL_TABLET | ORAL | 0 refills | Status: DC | PRN

## 2017-09-11 ENCOUNTER — Encounter (HOSPITAL_BASED_OUTPATIENT_CLINIC_OR_DEPARTMENT_OTHER): Payer: Medicaid Other | Attending: Internal Medicine

## 2017-09-11 DIAGNOSIS — F17200 Nicotine dependence, unspecified, uncomplicated: Secondary | ICD-10-CM | POA: Diagnosis not present

## 2017-09-11 DIAGNOSIS — X58XXXA Exposure to other specified factors, initial encounter: Secondary | ICD-10-CM | POA: Diagnosis not present

## 2017-09-11 DIAGNOSIS — C601 Malignant neoplasm of glans penis: Secondary | ICD-10-CM | POA: Insufficient documentation

## 2017-09-11 DIAGNOSIS — Z923 Personal history of irradiation: Secondary | ICD-10-CM | POA: Insufficient documentation

## 2017-09-11 DIAGNOSIS — D573 Sickle-cell trait: Secondary | ICD-10-CM | POA: Insufficient documentation

## 2017-09-11 DIAGNOSIS — Z9221 Personal history of antineoplastic chemotherapy: Secondary | ICD-10-CM | POA: Diagnosis not present

## 2017-09-11 DIAGNOSIS — S31104A Unspecified open wound of abdominal wall, left lower quadrant without penetration into peritoneal cavity, initial encounter: Secondary | ICD-10-CM | POA: Diagnosis not present

## 2017-09-21 NOTE — Progress Notes (Signed)
Hematology and Oncology Follow Up Visit  Ronald Wilson [id number] 05-15-1975 42 y.o. 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  His tumor showed a positive PDL 1 expression   Prior Therapy:   He is status post a biopsy of his lymphadenopathy in August 2018.  He is S/P cisplatin and 5-FU palliative chemotherapy.  He completed 3 cycles in November 2018 and developed progression of disease.  He is status post palliative radiation therapy between April 05, 2017 and April 20, 2017.  He received 30 Gy in 10 fractions to the pelvis and the periaortic areas.  He developed left upper extremity deep vein thrombosis and was started on Xarelto.  Current therapy:  Pembrolizumab 200 mg every 3 weeks started on July 19, 2017.  He is here for cycle 4 of therapy.   Interim History: Ronald Wilson is here for a follow-up visit.  Since the last visit, he reports no major changes in his health.  His inguinal adenopathy appears to be relatively stable.  He denies any active bleeding from the penis or any other wounds.  His performance status and quality of life remains unchanged.  He continues to tolerate Pembrolizumab without any complications.  He denies any excessive fatigue, tiredness or pruritus.  He denies any respiratory symptoms.  His bowel movements remain regular without any diarrhea.  He continues to have chronic pain issues related to his painful adenopathy.  He currently takes Percocet 5/325 2 tablets every 4 hours as needed for pain.  He is requesting to change this to [facility] 10/325 so that he can take less pills.  No neurological deficits at this time.  He does not report any headaches, blurry vision, syncope or seizures.  He denies any alteration in mental status.  He does not report any fevers, chills or sweats.  He does not  report any cough, wheezing or hemoptysis.  He does not report any chest pain, palpitations.  He does not report any nausea vomiting or abdominal pain. He does not report any constipation, diarrhea or over the satiety. He does not report any hematochezia or melena. He does not report any hematuria or dysuria.  He does not report any frequency urgency or hesitancy.  He does not report any pathological fractures or bone pain.  He does not report any skin rashes or lesions.  Remaining review of systems is negative.  Medications: I have reviewed the patient's current medications.  Current Outpatient Medications  Medication Sig Dispense Refill  . amLODipine (NORVASC) 10 MG tablet Take 1 tablet (10 mg total) by mouth daily. 30 tablet 5  . lidocaine-prilocaine (EMLA) cream Apply 1 application topically as needed. Apply to porta cath before chemotherapy. 30 g 0  . prochlorperazine (COMPAZINE) 10 MG tablet Take 1 tablet (10 mg total) by mouth every 6 (six) hours as needed for nausea or vomiting. 30 tablet 0  . oxyCODONE-acetaminophen (PERCOCET) 10-325 MG tablet Take 1 tablet by mouth every 4 (four) hours as needed for pain. 60 tablet 0   No current facility-administered medications for this visit.    Facility-Administered Medications Ordered in Other Visits  Medication Dose Route Frequency Provider Last Rate Last Dose  . heparin lock flush 100 unit/mL  500 Units Intracatheter Once PRN Wyatt Portela, MD      . sodium chloride flush (NS) 0.9 % injection 10 mL  10 mL Intracatheter  PRN Wyatt Portela, MD         Allergies: No Known Allergies  Past Medical History, Surgical history, Social history, and Family History reviewed today and unchanged.  [name] Physical Exam: Blood pressure (!) 149/98, pulse (!) 116, temperature 98.8 F (37.1 C), temperature source Oral, resp. rate 18, height 5\' 9"  (1.753 m), weight 185 lb 3.2 oz (84 kg), SpO2 100 %.   ECOG: 0 General appearance: Alert, awake gentleman  appeared comfortable. Head: Normocephalic without any abnormalities. Oropharynx: Next membranes are moist and pink. Lymph nodes: Palpable inguinal adenopathy left more than the right.  Appears to be stable in size. heart: Regular rate without any murmurs or gallops. Lung: Clear without any rhonchi, wheezes or dullness to percussion. Abdomin: Soft, without any rebound or guarding.  No shifting dullness or ascites. GU exam: No bleeding noted on exam.  Penile lesion noted unchanged. Musculoskeletal: No clubbing or cyanosis.  Left leg edema noted. Skin: No petechia or ecchymosis. Neurological: No motor or sensory deficits.   Lab Results: Lab Results  Component Value Date   WBC 16.7 (H) 09/22/2017   HGB 7.4 (L) 09/22/2017   HCT 23.6 (L) 09/22/2017   MCV 75.8 (L) 09/22/2017   PLT 662 (H) 09/22/2017     Chemistry      Component Value Date/Time   NA 140 09/22/2017 1225   NA 140 03/13/2017 0827   K 3.9 09/22/2017 1225   K 3.6 03/13/2017 0827   CL 104 09/22/2017 1225   CO2 26 09/22/2017 1225   CO2 28 03/13/2017 0827   BUN 12 09/22/2017 1225   BUN 15.6 03/13/2017 0827   CREATININE 1.23 09/22/2017 1225   CREATININE 1.82 (H) 06/06/2017 1149   CREATININE 2.0 (H) 03/13/2017 0827      Component Value Date/Time   CALCIUM 9.8 09/22/2017 1225   CALCIUM 10.0 03/13/2017 0827   ALKPHOS 89 09/22/2017 1225   ALKPHOS 78 03/13/2017 0827   AST 19 09/22/2017 1225   AST 14 06/06/2017 1149   AST 15 03/13/2017 0827   ALT 15 09/22/2017 1225   ALT 9 06/06/2017 1149   ALT 10 03/13/2017 0827   BILITOT <0.2 (L) 09/22/2017 1225   BILITOT 0.3 06/06/2017 1149   BILITOT 0.22 03/13/2017 0827       Impression and Plan:  42 year old man with the:  1. 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  His tumor is PDL 1+ and currently receiving Pembrolizumab.  He tolerated the last 3 cycles without any complications and ready to  proceed with the fourth cycle.  The natural course of this disease as well as long-term complication associated with this therapy were reviewed today and is agreeable to continue.  We will obtain imaging studies in July 2019 for staging purposes.  In the meantime we will continue the current dose and schedule.   2. IV access: Port-A-Cath remains in use without any complications.  3. Renal function surveillance: His kidney function remains stable with creatinine not dramatically changed.  4.  Anemia: Related to chronic blood losses and malignancy.  Hemoglobin is 7.4 today.  He is asymptomatic other than fatigue.  Okay to proceed with treatment as scheduled today.  Iron studies are pending.  May consider IV iron if his iron stores are low.  May consider a blood transfusion if his hemoglobin is less than 7.0.  5.  Left upper extremity deep vein thrombosis: He is off anticoagulation at  this time.  No recurrent thrombosis noted.  6.  Prognosis: He has an incurable malignancy with poor prognosis.  His performance status remains stable and aggressive therapy is warranted.  7. Follow-up: Will be 3 weeks for the next cycle of therapy.   Mikey Bussing, DNP, AGPCNP-BC, AOCNP 6/14/20191:21 PM

## 2017-09-22 ENCOUNTER — Inpatient Hospital Stay: Payer: Medicaid Other | Attending: Oncology | Admitting: Oncology

## 2017-09-22 ENCOUNTER — Other Ambulatory Visit: Payer: Self-pay | Admitting: Oncology

## 2017-09-22 ENCOUNTER — Inpatient Hospital Stay: Payer: Medicaid Other

## 2017-09-22 ENCOUNTER — Telehealth: Payer: Self-pay | Admitting: Oncology

## 2017-09-22 ENCOUNTER — Encounter: Payer: Self-pay | Admitting: Oncology

## 2017-09-22 VITALS — BP 149/98 | HR 116 | Temp 98.8°F | Resp 18 | Ht 69.0 in | Wt 185.2 lb

## 2017-09-22 DIAGNOSIS — T451X5A Adverse effect of antineoplastic and immunosuppressive drugs, initial encounter: Secondary | ICD-10-CM

## 2017-09-22 DIAGNOSIS — Z86718 Personal history of other venous thrombosis and embolism: Secondary | ICD-10-CM | POA: Insufficient documentation

## 2017-09-22 DIAGNOSIS — C609 Malignant neoplasm of penis, unspecified: Secondary | ICD-10-CM

## 2017-09-22 DIAGNOSIS — D649 Anemia, unspecified: Secondary | ICD-10-CM

## 2017-09-22 DIAGNOSIS — Z5111 Encounter for antineoplastic chemotherapy: Secondary | ICD-10-CM | POA: Insufficient documentation

## 2017-09-22 DIAGNOSIS — Z95828 Presence of other vascular implants and grafts: Secondary | ICD-10-CM

## 2017-09-22 DIAGNOSIS — C786 Secondary malignant neoplasm of retroperitoneum and peritoneum: Secondary | ICD-10-CM | POA: Insufficient documentation

## 2017-09-22 DIAGNOSIS — Z5112 Encounter for antineoplastic immunotherapy: Secondary | ICD-10-CM

## 2017-09-22 DIAGNOSIS — D6481 Anemia due to antineoplastic chemotherapy: Secondary | ICD-10-CM

## 2017-09-22 DIAGNOSIS — Z79899 Other long term (current) drug therapy: Secondary | ICD-10-CM

## 2017-09-22 DIAGNOSIS — Z7189 Other specified counseling: Secondary | ICD-10-CM

## 2017-09-22 LAB — CBC WITH DIFFERENTIAL/PLATELET
BASOS ABS: 0.1 10*3/uL (ref 0.0–0.1)
BASOS PCT: 1 %
EOS PCT: 5 %
Eosinophils Absolute: 0.8 10*3/uL — ABNORMAL HIGH (ref 0.0–0.5)
HCT: 23.6 % — ABNORMAL LOW (ref 38.4–49.9)
Hemoglobin: 7.4 g/dL — ABNORMAL LOW (ref 13.0–17.1)
Lymphocytes Relative: 5 %
Lymphs Abs: 0.9 10*3/uL (ref 0.9–3.3)
MCH: 23.8 pg — ABNORMAL LOW (ref 27.2–33.4)
MCHC: 31.4 g/dL — AB (ref 32.0–36.0)
MCV: 75.8 fL — ABNORMAL LOW (ref 79.3–98.0)
MONO ABS: 1 10*3/uL — AB (ref 0.1–0.9)
MONOS PCT: 6 %
Neutro Abs: 13.9 10*3/uL — ABNORMAL HIGH (ref 1.5–6.5)
Neutrophils Relative %: 83 %
PLATELETS: 662 10*3/uL — AB (ref 140–400)
RBC: 3.11 MIL/uL — ABNORMAL LOW (ref 4.20–5.82)
RDW: 19.9 % — AB (ref 11.0–14.6)
WBC: 16.7 10*3/uL — ABNORMAL HIGH (ref 4.0–10.3)

## 2017-09-22 LAB — COMPREHENSIVE METABOLIC PANEL
ALT: 15 U/L (ref 0–55)
AST: 19 U/L (ref 5–34)
Albumin: 2.8 g/dL — ABNORMAL LOW (ref 3.5–5.0)
Alkaline Phosphatase: 89 U/L (ref 40–150)
Anion gap: 10 (ref 3–11)
BUN: 12 mg/dL (ref 7–26)
CO2: 26 mmol/L (ref 22–29)
Calcium: 9.8 mg/dL (ref 8.4–10.4)
Chloride: 104 mmol/L (ref 98–109)
Creatinine, Ser: 1.23 mg/dL (ref 0.70–1.30)
GFR calc Af Amer: >60 mL/min (ref >60)
GFR calc non Af Amer: >60 mL/min (ref >60)
Glucose, Bld: 97 mg/dL (ref 70–140)
Potassium: 3.9 mmol/L (ref 3.5–5.1)
Sodium: 140 mmol/L (ref 136–145)
Total Bilirubin: <0.2 mg/dL — ABNORMAL LOW (ref 0.2–1.2)
Total Protein: 7.3 g/dL (ref 6.4–8.3)

## 2017-09-22 LAB — IRON AND TIBC
Iron: 12 ug/dL — ABNORMAL LOW (ref 42–163)
Saturation Ratios: 6 % — ABNORMAL LOW (ref 42–163)
TIBC: 195 ug/dL — ABNORMAL LOW (ref 202–409)
UIBC: 183 ug/dL

## 2017-09-22 LAB — FERRITIN: Ferritin: 246 ng/mL (ref 22–316)

## 2017-09-22 MED ORDER — OXYCODONE-ACETAMINOPHEN 10-325 MG PO TABS: 1.0000 | ORAL_TABLET | ORAL | 0 refills | Status: DC | PRN

## 2017-09-22 MED ORDER — SODIUM CHLORIDE 0.9 % IV SOLN
Freq: Once | INTRAVENOUS | Status: AC
  Administered 2017-09-22: 15:00:00 via INTRAVENOUS

## 2017-09-22 MED ORDER — SODIUM CHLORIDE 0.9% FLUSH
10.0000 mL | INTRAVENOUS | Status: DC | PRN
  Administered 2017-09-22: 10 mL
  Filled 2017-09-22: qty 10

## 2017-09-22 MED ORDER — SODIUM CHLORIDE 0.9 % IV SOLN
200.0000 mg | Freq: Once | INTRAVENOUS | Status: AC
  Administered 2017-09-22: 200 mg via INTRAVENOUS
  Filled 2017-09-22: qty 8

## 2017-09-22 MED ORDER — HEPARIN SOD (PORK) LOCK FLUSH 100 UNIT/ML IV SOLN
500.0000 [IU] | Freq: Once | INTRAVENOUS | Status: AC | PRN
  Administered 2017-09-22: 500 [IU]
  Filled 2017-09-22: qty 5

## 2017-09-22 NOTE — Patient Instructions (Signed)
[facility] Discharge Instructions for Patients Receiving Chemotherapy  Today you received the following chemotherapy agents:  Keytruda.  To help prevent nausea and vomiting after your treatment, we encourage you to take your nausea medication as directed.   If you develop nausea and vomiting that is not controlled by your nausea medication, call the clinic.   BELOW ARE SYMPTOMS THAT SHOULD BE REPORTED IMMEDIATELY:  *FEVER GREATER THAN 100.5 F  *CHILLS WITH OR WITHOUT FEVER  NAUSEA AND VOMITING THAT IS NOT CONTROLLED WITH YOUR NAUSEA MEDICATION  *UNUSUAL SHORTNESS OF BREATH  *UNUSUAL BRUISING OR BLEEDING  TENDERNESS IN MOUTH AND THROAT WITH OR WITHOUT PRESENCE OF ULCERS  *URINARY PROBLEMS  *BOWEL PROBLEMS  UNUSUAL RASH Items with * indicate a potential emergency and should be followed up as soon as possible.  Feel free to call the clinic should you have any questions or concerns. The clinic phone number is [phone].  Please show the CHEMO ALERT CARD at check-in to the Emergency Department and triage nurse.    

## 2017-09-22 NOTE — Telephone Encounter (Signed)
Keep appts as sched per 6/14 los

## 2017-09-26 ENCOUNTER — Telehealth: Payer: Self-pay | Admitting: Oncology

## 2017-09-26 NOTE — Telephone Encounter (Signed)
Called pt re appts that were added per 6/14 sch msg - spoke w/ pt re appts

## 2017-09-27 DIAGNOSIS — S31104A Unspecified open wound of abdominal wall, left lower quadrant without penetration into peritoneal cavity, initial encounter: Secondary | ICD-10-CM | POA: Diagnosis not present

## 2017-09-28 ENCOUNTER — Telehealth: Payer: Self-pay

## 2017-09-28 ENCOUNTER — Inpatient Hospital Stay: Payer: Medicaid Other

## 2017-09-28 NOTE — Telephone Encounter (Signed)
1400-Called Ronald Wilson and he forgot about his appointment today so I rescheduled him for Saturday at 1000.  He was given instructions to come straight back to infusion on Saturday.  He said he would definitely be here. [name]

## 2017-09-30 ENCOUNTER — Inpatient Hospital Stay: Payer: Medicaid Other

## 2017-09-30 ENCOUNTER — Other Ambulatory Visit: Payer: Self-pay

## 2017-09-30 VITALS — BP 145/94 | HR 102 | Temp 98.3°F | Resp 20

## 2017-09-30 DIAGNOSIS — E860 Dehydration: Secondary | ICD-10-CM

## 2017-09-30 DIAGNOSIS — Z95828 Presence of other vascular implants and grafts: Secondary | ICD-10-CM

## 2017-09-30 DIAGNOSIS — Z5111 Encounter for antineoplastic chemotherapy: Secondary | ICD-10-CM | POA: Diagnosis not present

## 2017-09-30 MED ORDER — ALTEPLASE 2 MG IJ SOLR
2.0000 mg | Freq: Once | INTRAMUSCULAR | Status: DC | PRN
  Filled 2017-09-30: qty 2

## 2017-09-30 MED ORDER — HEPARIN SOD (PORK) LOCK FLUSH 100 UNIT/ML IV SOLN
500.0000 [IU] | Freq: Once | INTRAVENOUS | Status: AC | PRN
  Administered 2017-09-30: 500 [IU]
  Filled 2017-09-30: qty 5

## 2017-09-30 MED ORDER — SODIUM CHLORIDE 0.9 % IV SOLN
Freq: Once | INTRAVENOUS | Status: AC
  Administered 2017-09-30: 10:00:00 via INTRAVENOUS

## 2017-09-30 MED ORDER — SODIUM CHLORIDE 0.9 % IV SOLN: Freq: Once | INTRAVENOUS | Status: DC

## 2017-09-30 MED ORDER — SODIUM CHLORIDE 0.9% FLUSH
3.0000 mL | Freq: Once | INTRAVENOUS | Status: DC | PRN
  Filled 2017-09-30: qty 10

## 2017-09-30 MED ORDER — HEPARIN SOD (PORK) LOCK FLUSH 100 UNIT/ML IV SOLN
250.0000 [IU] | Freq: Once | INTRAVENOUS | Status: DC | PRN
  Filled 2017-09-30: qty 5

## 2017-09-30 NOTE — Patient Instructions (Signed)
Dehydration, Adult Dehydration is when there is not enough fluid or water in your body. This happens when you lose more fluids than you take in. Dehydration can range from mild to very bad. It should be treated right away to keep it from getting very bad. Symptoms of mild dehydration may include:  Thirst.  Dry lips.  Slightly dry mouth.  Dry, warm skin.  Dizziness. Symptoms of moderate dehydration may include:  Very dry mouth.  Muscle cramps.  Dark pee (urine). Pee may be the color of tea.  Your body making less pee.  Your eyes making fewer tears.  Heartbeat that is uneven or faster than normal (palpitations).  Headache.  Light-headedness, especially when you stand up from sitting.  Fainting (syncope). Symptoms of very bad dehydration may include:  Changes in skin, such as: ? Cold and clammy skin. ? Blotchy (mottled) or pale skin. ? Skin that does not quickly return to normal after being lightly pinched and let go (poor skin turgor).  Changes in body fluids, such as: ? Feeling very thirsty. ? Your eyes making fewer tears. ? Not sweating when body temperature is high, such as in hot weather. ? Your body making very little pee.  Changes in vital signs, such as: ? Weak pulse. ? Pulse that is more than 100 beats a minute when you are sitting still. ? Fast breathing. ? Low blood pressure.  Other changes, such as: ? Sunken eyes. ? Cold hands and feet. ? Confusion. ? Lack of energy (lethargy). ? Trouble waking up from sleep. ? Short-term weight loss. ? Unconsciousness. Follow these instructions at home:  If told by your doctor, drink an ORS: ? Make an ORS by using instructions on the package. ? Start by drinking small amounts, about  cup (120 mL) every 5-10 minutes. ? Slowly drink more until you have had the amount that your doctor said to have.  Drink enough clear fluid to keep your pee clear or pale yellow. If you were told to drink an ORS, finish the ORS  first, then start slowly drinking clear fluids. Drink fluids such as: ? Water. Do not drink only water by itself. Doing that can make the salt (sodium) level in your body get too low (hyponatremia). ? Ice chips. ? Fruit juice that you have added water to (diluted). ? Low-calorie sports drinks.  Avoid: ? Alcohol. ? Drinks that have a lot of sugar. These include high-calorie sports drinks, fruit juice that does not have water added, and soda. ? Caffeine. ? Foods that are greasy or have a lot of fat or sugar.  Take over-the-counter and prescription medicines only as told by your doctor.  Do not take salt tablets. Doing that can make the salt level in your body get too high (hypernatremia).  Eat foods that have minerals (electrolytes). Examples include bananas, oranges, potatoes, tomatoes, and spinach.  Keep all follow-up visits as told by your doctor. This is important. Contact a doctor if:  You have belly (abdominal) pain that: ? Gets worse. ? Stays in one area (localizes).  You have a rash.  You have a stiff neck.  You get angry or annoyed more easily than normal (irritability).  You are more sleepy than normal.  You have a harder time waking up than normal.  You feel: ? Weak. ? Dizzy. ? Very thirsty.  You have peed (urinated) only a small amount of very dark pee during 6-8 hours. Get help right away if:  You have symptoms of   very bad dehydration.  You cannot drink fluids without throwing up (vomiting).  Your symptoms get worse with treatment.  You have a fever.  You have a very bad headache.  You are throwing up or having watery poop (diarrhea) and it: ? Gets worse. ? Does not go away.  You have blood or something green (bile) in your throw-up.  You have blood in your poop (stool). This may cause poop to look black and tarry.  You have not peed in 6-8 hours.  You pass out (faint).  Your heart rate when you are sitting still is more than 100 beats a  minute.  You have trouble breathing. This information is not intended to replace advice given to you by your health care provider. Make sure you discuss any questions you have with your health care provider. Document Released: 01/22/2009 Document Revised: 10/16/2015 Document Reviewed: 05/22/2015 Elsevier Interactive Patient Education  2018 [employer]  

## 2017-09-30 NOTE — Progress Notes (Signed)
na

## 2017-10-03 ENCOUNTER — Other Ambulatory Visit: Payer: Self-pay | Admitting: *Deleted

## 2017-10-03 DIAGNOSIS — C609 Malignant neoplasm of penis, unspecified: Secondary | ICD-10-CM

## 2017-10-03 MED ORDER — OXYCODONE-ACETAMINOPHEN 10-325 MG PO TABS: 1.0000 | ORAL_TABLET | ORAL | 0 refills | Status: DC | PRN

## 2017-10-05 ENCOUNTER — Inpatient Hospital Stay: Payer: Medicaid Other

## 2017-10-05 VITALS — BP 151/95 | HR 99 | Temp 98.4°F | Resp 16

## 2017-10-05 DIAGNOSIS — Z95828 Presence of other vascular implants and grafts: Secondary | ICD-10-CM

## 2017-10-05 DIAGNOSIS — Z5111 Encounter for antineoplastic chemotherapy: Secondary | ICD-10-CM | POA: Diagnosis not present

## 2017-10-05 MED ORDER — SODIUM CHLORIDE 0.9 % IV SOLN
510.0000 mg | Freq: Once | INTRAVENOUS | Status: AC
  Administered 2017-10-05: 510 mg via INTRAVENOUS
  Filled 2017-10-05: qty 17

## 2017-10-05 MED ORDER — HEPARIN SOD (PORK) LOCK FLUSH 100 UNIT/ML IV SOLN
500.0000 [IU] | Freq: Once | INTRAVENOUS | Status: AC | PRN
  Administered 2017-10-05: 500 [IU]
  Filled 2017-10-05: qty 5

## 2017-10-05 MED ORDER — SODIUM CHLORIDE 0.9 % IV SOLN
Freq: Once | INTRAVENOUS | Status: AC
  Administered 2017-10-05: 09:00:00 via INTRAVENOUS

## 2017-10-05 MED ORDER — SODIUM CHLORIDE 0.9 % IV SOLN
Freq: Once | INTRAVENOUS | Status: AC
  Administered 2017-10-05: 10:00:00 via INTRAVENOUS

## 2017-10-05 MED ORDER — SODIUM CHLORIDE 0.9% FLUSH
10.0000 mL | INTRAVENOUS | Status: DC | PRN
  Administered 2017-10-05: 10 mL
  Filled 2017-10-05: qty 10

## 2017-10-05 NOTE — Patient Instructions (Signed)
[facility] Discharge Instructions for Patients Receiving Chemotherapy  Today you received the following chemotherapy agents: Feraheme.  To help prevent nausea and vomiting after your treatment, we encourage you to take your nausea medication as prescribed.   If you develop nausea and vomiting that is not controlled by your nausea medication, call the clinic.   BELOW ARE SYMPTOMS THAT SHOULD BE REPORTED IMMEDIATELY:  *FEVER GREATER THAN 100.5 F  *CHILLS WITH OR WITHOUT FEVER  NAUSEA AND VOMITING THAT IS NOT CONTROLLED WITH YOUR NAUSEA MEDICATION  *UNUSUAL SHORTNESS OF BREATH  *UNUSUAL BRUISING OR BLEEDING  TENDERNESS IN MOUTH AND THROAT WITH OR WITHOUT PRESENCE OF ULCERS  *URINARY PROBLEMS  *BOWEL PROBLEMS  UNUSUAL RASH Items with * indicate a potential emergency and should be followed up as soon as possible.  Feel free to call the clinic should you have any questions or concerns. The clinic phone number is [phone].  Please show the [facility] at check-in to the Emergency Department and triage nurse.    Ferumoxytol injection (Feraheme) What is this medicine? FERUMOXYTOL is an iron complex. Iron is used to make healthy red blood cells, which carry oxygen and nutrients throughout the body. This medicine is used to treat iron deficiency anemia in people with chronic kidney disease. This medicine may be used for other purposes; ask your health care provider or pharmacist if you have questions. COMMON BRAND NAME(S): Feraheme What should I tell my health care provider before I take this medicine? They need to know if you have any of these conditions: -anemia not caused by low iron levels -high levels of iron in the blood -magnetic resonance imaging (MRI) test scheduled -an unusual or allergic reaction to iron, other medicines, foods, dyes, or preservatives -pregnant or trying to get pregnant -breast-feeding How should I use this medicine? This  medicine is for injection into a vein. It is given by a health care professional in a hospital or clinic setting. Talk to your pediatrician regarding the use of this medicine in children. Special care may be needed. Overdosage: If you think you have taken too much of this medicine contact a poison control center or emergency room at once. NOTE: This medicine is only for you. Do not share this medicine with others. What if I miss a dose? It is important not to miss your dose. Call your doctor or health care professional if you are unable to keep an appointment. What may interact with this medicine? This medicine may interact with the following medications: -other iron products This list may not describe all possible interactions. Give your health care provider a list of all the medicines, herbs, non-prescription drugs, or dietary supplements you use. Also tell them if you smoke, drink alcohol, or use illegal drugs. Some items may interact with your medicine. What should I watch for while using this medicine? Visit your doctor or healthcare professional regularly. Tell your doctor or healthcare professional if your symptoms do not start to get better or if they get worse. You may need blood work done while you are taking this medicine. You may need to follow a special diet. Talk to your doctor. Foods that contain iron include: whole grains/cereals, dried fruits, beans, or peas, leafy green vegetables, and organ meats (liver, kidney). What side effects may I notice from receiving this medicine? Side effects that you should report to your doctor or health care professional as soon as possible: -allergic reactions like skin rash, itching or hives, swelling  of the face, lips, or tongue -breathing problems -changes in blood pressure -feeling faint or lightheaded, falls -fever or chills -flushing, sweating, or hot feelings -swelling of the ankles or feet Side effects that usually do not require medical  attention (report to your doctor or health care professional if they continue or are bothersome): -diarrhea -headache -nausea, vomiting -stomach pain This list may not describe all possible side effects. Call your doctor for medical advice about side effects. You may report side effects to FDA at 1-800-FDA-1088. Where should I keep my medicine? This drug is given in a hospital or clinic and will not be stored at home. NOTE: This sheet is a summary. It may not cover all possible information. If you have questions about this medicine, talk to your doctor, pharmacist, or health care provider.  2018 Elsevier/Gold Standard (2015-04-30 12:41:49)

## 2017-10-08 ENCOUNTER — Encounter (HOSPITAL_COMMUNITY): Payer: Self-pay | Admitting: Emergency Medicine

## 2017-10-08 ENCOUNTER — Emergency Department (HOSPITAL_COMMUNITY): Payer: Medicaid Other

## 2017-10-08 ENCOUNTER — Emergency Department (HOSPITAL_COMMUNITY)
Admission: EM | Admit: 2017-10-08 | Discharge: 2017-10-08 | Disposition: A | Discharge status: Home / Self Care | Payer: Medicaid Other | Attending: Emergency Medicine | Admitting: Emergency Medicine

## 2017-10-08 DIAGNOSIS — F17210 Nicotine dependence, cigarettes, uncomplicated: Secondary | ICD-10-CM | POA: Insufficient documentation

## 2017-10-08 DIAGNOSIS — I10 Essential (primary) hypertension: Secondary | ICD-10-CM | POA: Diagnosis not present

## 2017-10-08 DIAGNOSIS — Z79899 Other long term (current) drug therapy: Secondary | ICD-10-CM | POA: Diagnosis not present

## 2017-10-08 DIAGNOSIS — N432 Other hydrocele: Secondary | ICD-10-CM | POA: Insufficient documentation

## 2017-10-08 DIAGNOSIS — N4821 Abscess of corpus cavernosum and penis: Secondary | ICD-10-CM | POA: Insufficient documentation

## 2017-10-08 DIAGNOSIS — C609 Malignant neoplasm of penis, unspecified: Secondary | ICD-10-CM | POA: Diagnosis not present

## 2017-10-08 DIAGNOSIS — N5089 Other specified disorders of the male genital organs: Secondary | ICD-10-CM | POA: Diagnosis present

## 2017-10-08 LAB — COMPREHENSIVE METABOLIC PANEL
ALBUMIN: 2.6 g/dL — AB (ref 3.5–5.0)
ALK PHOS: 76 U/L (ref 38–126)
ALT: 12 U/L (ref 0–44)
AST: 13 U/L — ABNORMAL LOW (ref 15–41)
Anion gap: 7 (ref 5–15)
BUN: 13 mg/dL (ref 6–20)
CALCIUM: 9.2 mg/dL (ref 8.9–10.3)
CHLORIDE: 107 mmol/L (ref 98–111)
CO2: 26 mmol/L (ref 22–32)
Creatinine, Ser: 1.4 mg/dL — ABNORMAL HIGH (ref 0.61–1.24)
GFR calc Af Amer: >60 mL/min (ref >60)
GFR calc non Af Amer: >60 mL/min (ref >60)
GLUCOSE: 116 mg/dL — AB (ref 70–99)
POTASSIUM: 3.6 mmol/L (ref 3.5–5.1)
SODIUM: 140 mmol/L (ref 135–145)
TOTAL PROTEIN: 7 g/dL (ref 6.5–8.1)
Total Bilirubin: 0.2 mg/dL — ABNORMAL LOW (ref 0.3–1.2)

## 2017-10-08 LAB — CBC WITH DIFFERENTIAL/PLATELET
BASOS ABS: 0 10*3/uL (ref 0.0–0.1)
BASOS PCT: 0 %
EOS ABS: 0.6 10*3/uL (ref 0.0–0.7)
Eosinophils Relative: 4 %
HCT: 22.4 % — ABNORMAL LOW (ref 39.0–52.0)
Hemoglobin: 7 g/dL — ABNORMAL LOW (ref 13.0–17.0)
Lymphocytes Relative: 6 %
Lymphs Abs: 0.9 10*3/uL (ref 0.7–4.0)
MCH: 23 pg — ABNORMAL LOW (ref 26.0–34.0)
MCHC: 31.3 g/dL (ref 30.0–36.0)
MCV: 73.4 fL — ABNORMAL LOW (ref 78.0–100.0)
MONOS PCT: 7 %
Monocytes Absolute: 1.1 10*3/uL — ABNORMAL HIGH (ref 0.1–1.0)
NEUTROS PCT: 83 %
Neutro Abs: 12 10*3/uL — ABNORMAL HIGH (ref 1.7–7.7)
Platelets: 693 10*3/uL — ABNORMAL HIGH (ref 150–400)
RBC: 3.05 MIL/uL — AB (ref 4.22–5.81)
RDW: 18.4 % — ABNORMAL HIGH (ref 11.5–15.5)
WBC: 14.6 10*3/uL — AB (ref 4.0–10.5)

## 2017-10-08 LAB — URINALYSIS, ROUTINE W REFLEX MICROSCOPIC
BACTERIA UA: NONE SEEN
Bilirubin Urine: NEGATIVE
GLUCOSE, UA: NEGATIVE mg/dL
KETONES UR: NEGATIVE mg/dL
Nitrite: NEGATIVE
PROTEIN: NEGATIVE mg/dL
Specific Gravity, Urine: 1.032 — ABNORMAL HIGH (ref 1.005–1.030)
pH: 6 (ref 5.0–8.0)

## 2017-10-08 LAB — I-STAT CG4 LACTIC ACID, ED: LACTIC ACID, VENOUS: 1.16 mmol/L (ref 0.5–1.9)

## 2017-10-08 MED ORDER — HYDROMORPHONE HCL 1 MG/ML IJ SOLN
1.0000 mg | Freq: Once | INTRAMUSCULAR | Status: AC
  Administered 2017-10-08: 1 mg via INTRAVENOUS
  Filled 2017-10-08: qty 1

## 2017-10-08 MED ORDER — IOPAMIDOL (ISOVUE-300) INJECTION 61%
100.0000 mL | Freq: Once | INTRAVENOUS | Status: AC | PRN
  Administered 2017-10-08: 100 mL via INTRAVENOUS

## 2017-10-08 MED ORDER — IOPAMIDOL (ISOVUE-300) INJECTION 61%
INTRAVENOUS | Status: AC
  Filled 2017-10-08: qty 100

## 2017-10-08 MED ORDER — CLINDAMYCIN HCL 150 MG PO CAPS: 450.0000 mg | ORAL_CAPSULE | Freq: Three times a day (TID) | ORAL | 0 refills | Status: AC

## 2017-10-08 MED ORDER — VANCOMYCIN HCL IN DEXTROSE 1-5 GM/200ML-% IV SOLN: 1000.0000 mg | Freq: Once | INTRAVENOUS | Status: DC

## 2017-10-08 MED ORDER — PIPERACILLIN-TAZOBACTAM 3.375 G IVPB 30 MIN
3.3750 g | Freq: Once | INTRAVENOUS | Status: AC
  Administered 2017-10-08: 3.375 g via INTRAVENOUS
  Filled 2017-10-08: qty 50

## 2017-10-08 MED ORDER — VANCOMYCIN HCL 10 G IV SOLR
2000.0000 mg | Freq: Once | INTRAVENOUS | Status: AC
  Administered 2017-10-08: 2000 mg via INTRAVENOUS
  Filled 2017-10-08: qty 2000

## 2017-10-08 MED ORDER — CLINDAMYCIN PHOSPHATE 900 MG/50ML IV SOLN
900.0000 mg | Freq: Once | INTRAVENOUS | Status: AC
  Administered 2017-10-08: 900 mg via INTRAVENOUS
  Filled 2017-10-08: qty 50

## 2017-10-08 MED ORDER — HEPARIN SOD (PORK) LOCK FLUSH 100 UNIT/ML IV SOLN
500.0000 [IU] | Freq: Once | INTRAVENOUS | Status: AC
  Administered 2017-10-08: 500 [IU]
  Filled 2017-10-08: qty 5

## 2017-10-08 NOTE — ED Notes (Signed)
[facility] called for transport.  

## 2017-10-08 NOTE — ED Provider Notes (Signed)
[facility] DEPT Provider Note  CSN: [id number] Arrival date & time: 10/08/17 2778  Chief Complaint(s) swollen testicles  HPI Ronald Wilson is a 42 y.o. 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  Patient also with associated penile and scrotal pain.  Pain exacerbated with palpation.  No alleviating factors.  States that he developed a boil at the base of his penis which ruptured 24 hours ago.  He endorses purulent discharge.  Denies any prior infections in this region.  Denies any fevers or chills.  No nausea vomiting.  No abdominal pain.  No chest pain or shortness of breath.  No diarrhea.  HPI  Past Medical History Past Medical History:  Diagnosis Date  . Hypertension   . Inguinal mass 11/17/2016   left  . Sickle cell trait ([place])   . squamous cell penile ca dx'd 11/2015   Patient Active Problem List   Diagnosis Date Noted  . Port-A-Cath in place 07/19/2017  . Wound, open 06/28/2017  . Palliative care by specialist   . Penile cancer ([place] [facility]) 06/23/2017  . Sepsis secondary to UTI ([facility]) 06/23/2017  . Anemia associated with chemotherapy 06/23/2017  . DVT (deep venous thrombosis) ([facility]) 06/23/2017  . Sepsis ([place]) 06/23/2017  . Goals of care, counseling/discussion 12/16/2016  . Penis cancer ([facility]) 12/16/2016  . Inguinal lymphadenopathy   . Inguinal mass 11/17/2016  . 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 Anemia 11/17/2016  . Unilateral edema of left lower extremity 11/17/2016  . Tobacco use disorder 11/17/2016   Home Medication(s) Prior to Admission medications   Medication Sig Start Date End Date Taking? Authorizing Provider  lidocaine-prilocaine (EMLA) cream Apply 1 application topically as needed. Apply to porta  cath before chemotherapy. 12/16/16  Yes Wyatt Portela, MD  naproxen sodium (ALEVE) 220 MG tablet Take 220 mg by mouth daily as needed (pain).   Yes [provider]  oxyCODONE-acetaminophen (PERCOCET) 10-325 MG tablet Take 1 tablet by mouth every 4 (four) hours as needed for pain. 10/03/17  Yes Wyatt Portela, MD  prochlorperazine (COMPAZINE) 10 MG tablet Take 1 tablet (10 mg total) by mouth every 6 (six) hours as needed for nausea or vomiting. 07/13/17  Yes Wyatt Portela, MD  amLODipine (NORVASC) 10 MG tablet Take 1 tablet (10 mg total) by mouth daily. 07/13/17   Wyatt Portela, MD  clindamycin (CLEOCIN) 150 MG capsule Take 3 capsules (450 mg total) by mouth 3 (three) times daily for 5 days. 10/08/17 10/13/17  Fatima Blank, MD                                                                                                                                    Past  Surgical History Past Surgical History:  Procedure Laterality Date  . IR FLUORO GUIDE PORT INSERTION RIGHT  12/26/2016  . IR US GUIDE VASC ACCESS RIGHT  12/26/2016  . NO PAST SURGERIES     Family History Family History  Problem Relation Age of Onset  . Hypertension Mother   . Cancer Maternal Aunt        unknown    Social History Social History   Tobacco Use  . Smoking status: Current Every Day Smoker    Packs/day: 0.25    Years: 25.00    Pack years: 6.25    Types: Cigarettes  . Smokeless tobacco: Never Used  Substance Use Topics  . Alcohol use: Yes    Alcohol/week: 9.0 oz    Types: 15 Shots of liquor per week    Comment: "11/17/2016 "I'll have shots ~ 3 days/wk"  . Drug use: Yes    Types: Marijuana    Comment: 11/17/2016 "daily"   Allergies Patient has no known allergies.  Review of Systems Review of Systems All other systems are reviewed and are negative for acute change except as noted in the HPI  Physical Exam Vital Signs  I have reviewed the triage vital signs BP 136/90 (BP Location: Right Arm)    Pulse 99   Temp 98.7 F (37.1 C) (Oral)   Resp (!) 24   SpO2 100%   Physical Exam  Constitutional: He is oriented to person, place, and time. He appears well-developed and well-nourished. No distress.  HENT:  Head: Normocephalic and atraumatic.  Nose: Nose normal.  Eyes: Pupils are equal, round, and reactive to light. Conjunctivae and EOM are normal. Right eye exhibits no discharge. Left eye exhibits no discharge. No scleral icterus.  Neck: Normal range of motion. Neck supple.  Cardiovascular: Regular rhythm. Tachycardia present. Exam reveals no gallop and no friction rub.  No murmur heard. Pulmonary/Chest: Effort normal and breath sounds normal. No stridor. No respiratory distress. He has no rales.  Abdominal: Soft. He exhibits no distension. There is no tenderness.  Genitourinary:     Musculoskeletal: He exhibits no edema or tenderness.  Neurological: He is alert and oriented to person, place, and time.  Skin: Skin is warm and dry. No rash noted. He is not diaphoretic. No erythema.  Psychiatric: He has a normal mood and affect.  Vitals reviewed.   ED Results and Treatments Labs (all labs ordered are listed, but only abnormal results are displayed) Labs Reviewed  COMPREHENSIVE METABOLIC PANEL - Abnormal; Notable for the following components:      Result Value   Glucose, Bld 116 (*)    Creatinine, Ser 1.40 (*)    Albumin 2.6 (*)    AST 13 (*)    Total Bilirubin 0.2 (*)    All other components within normal limits  CBC WITH DIFFERENTIAL/PLATELET - Abnormal; Notable for the following components:   WBC 14.6 (*)    RBC 3.05 (*)    Hemoglobin 7.0 (*)    HCT 22.4 (*)    MCV 73.4 (*)    MCH 23.0 (*)    RDW 18.4 (*)    Platelets 693 (*)    Neutro Abs 12.0 (*)    Monocytes Absolute 1.1 (*)    All other components within normal limits  URINALYSIS, ROUTINE W REFLEX MICROSCOPIC - Abnormal; Notable for the following components:   Specific Gravity, Urine 1.032 (*)    Hgb urine  dipstick SMALL (*)    Leukocytes, UA MODERATE (*)  WBC, UA >50 (*)    All other components within normal limits  CULTURE, BLOOD (ROUTINE X 2)  CULTURE, BLOOD (ROUTINE X 2)  I-STAT CG4 LACTIC ACID, ED  I-STAT CG4 LACTIC ACID, ED                                                                                                                         EKG  EKG Interpretation  Date/Time:    Ventricular Rate:    PR Interval:    QRS Duration:   QT Interval:    QTC Calculation:   R Axis:     Text Interpretation:        Radiology Ct Abdomen Pelvis W Contrast  Result Date: 10/08/2017 CLINICAL DATA:  Swollen testicles x2 days. History of penile cancer last chemotherapy treatment 2 weeks ago. EXAM: CT ABDOMEN AND PELVIS WITH CONTRAST TECHNIQUE: Multidetector CT imaging of the abdomen and pelvis was performed using the standard protocol following bolus administration of intravenous contrast. CONTRAST:  18mL ISOVUE-300 IOPAMIDOL (ISOVUE-300) INJECTION 61% COMPARISON:  Pelvic CT 06/23/2017 and CT abdomen and pelvis 06/06/2017 and 11/17/2016 FINDINGS: Lower chest: Heart size is top normal. No pericardial effusion or thickening. Linear atelectasis and/or scarring is noted at each lung base. No effusion or pneumothorax. Hepatobiliary: Stable 5 mm hypodensity in the right hepatic lobe, too small to further characterize and nonspecific. This is unchanged relative to the 06/06/2017 study and not apparent on the 11/17/2016 exam. Mild fatty infiltration about the falciform. Pancreas: No focal pancreatic mass. Top-normal size pancreatic duct without stones. No inflammation. Spleen: Normal Adrenals/Urinary Tract: Normal bilateral adrenal glands. Symmetric cortical enhancement of both kidneys. Mild circumferential mural thickening of the bladder is again noted some which is due to underdistention. Post treatment change or cystitis are not entirely excluded. Stomach/Bowel: Stomach is within normal limits. No  evidence of bowel wall thickening, distention, or inflammatory changes. Vascular/Lymphatic: Minimal aortic atherosclerosis without dissection or aneurysm. Patent branch vessels. Splenic and portal veins are patent. Comparisons are made with the CT abdomen and pelvis from 06/06/2017 for the abdominal adenopathy. Pelvic lymphadenopathy is also commented upon and compared to 06/23/2017. Redemonstration of retroperitoneal and retrocrural adenopathy within the abdomen. Index lymph node in the left para aortic region is unchanged at 3.1 cm short axis, series 2/38 versus series 2/37 on prior. Interval decrease in left common iliac chain index lymph node now measuring 1.2 cm versus 1.7 cm on 06/23/2017 and 2.1 cm 06/06/2017, series 2/58. Slight decrease in bulky partially calcified and partially necrotic left iliac chain lymphadenopathy, measuring 4.1 cm short axis versus 4.3 cm on 06/23/2017 and 4.5 cm prior to that, series 2/72. Interval decrease in bulky left inguinal lymphadenopathy is also demonstrated with previously cystic/necrotic appearing lymph node previously measuring 7.2 cm on prior exams and now 3.9 cm and less cystic in appearance, series 2/88 versus series 2/80. Interval increase in size of right inguinal lymphadenopathy however since prior exams measuring 2.7 cm versus 2.6 cm on  06/23/2017 and 1.1 cm short axis, series 2/79. Further caudad a more cystic enlarged inguinal lymph node measuring 4.3 cm in diameter is noted versus not apparent on most recent comparison. Stable small left obturator internus lymph node measuring 0.9 cm, series 2/78. Reproductive: Prostate and seminal vesicles are stable and within normal limits for size. Other: None Musculoskeletal: Lymphedema of the left included thigh. Moderate hydroceles within the scrotum. No aggressive osseous lesions. IMPRESSION: 1. Abdominopelvic lymphadenopathy is redemonstrated for the most part improved however there has been some further interval  increase in size of right inguinal lymphadenopathy since the comparison studies. 2. Lymphedema persists of the left thigh. 3. Moderate bilateral hydroceles. Electronically Signed   By: Ashley Royalty M.D.   On: 10/08/2017 03:57   Pertinent labs & imaging results that were available during my care of the patient were reviewed by me and considered in my medical decision making (see chart for details).  Medications Ordered in ED Medications  iopamidol (ISOVUE-300) 61 % injection (has no administration in time range)  piperacillin-tazobactam (ZOSYN) IVPB 3.375 g (0 g Intravenous Stopped 10/08/17 0250)  clindamycin (CLEOCIN) IVPB 900 mg (0 mg Intravenous Stopped 10/08/17 0250)  vancomycin (VANCOCIN) 2,000 mg in sodium chloride 0.9 % 500 mL IVPB (2,000 mg Intravenous New Bag/Given 10/08/17 0209)  HYDROmorphone (DILAUDID) injection 1 mg (1 mg Intravenous Given 10/08/17 0250)  iopamidol (ISOVUE-300) 61 % injection 100 mL (100 mLs Intravenous Contrast Given 10/08/17 0304)                                                                                                                                    Procedures Procedures  (including critical care time)  Medical Decision Making / ED Course I have reviewed the nursing notes for this encounter and the patient's prior records (if available in EHR or on provided paperwork).    Patient is afebrile but tachycardic.  Stable pressures.  Given immunosuppression with likely skin infection, code sepsis was initiated and patient started on empiric antibiotics due to the concern for possible Fournier/necrotizing fasciitis.  Will obtain septic labs and CT of the abdomen and pelvis to further evaluate.  Tachycardia improved.  Patient with leukocytosis but this appears to be patient's baseline.  Hemoglobin stable.  Rest of the labs are at close to patient's baseline and reassuring.  Lactic acid within normal limits.  CT obtained which did not reveal evidence of soft tissue  or deep tissue infection. Code sepsis cancelled. It did reveal interval enlargement of the right inguinal mass likely causing lymphedema and hydroceles.  Patient does not have any testicular pain concerning for torsion.  Given the patient's reported history of penile shaft abscess, will prescribe the patient with oral antibiotics.  Given his reassuring work-up, feel the patient would be appropriate for outpatient management and close follow-up with oncology.  Final Clinical Impression(s) / ED Diagnoses Final diagnoses:  Penile abscess  Other hydrocele  Disposition: Discharge  Condition: Good  I have discussed the results, Dx and Tx plan with the patient who expressed understanding and agree(s) with the plan. Discharge instructions discussed at great length. The patient was given strict return precautions who verbalized understanding of the instructions. No further questions at time of discharge.    ED Discharge Orders        Ordered    clindamycin (CLEOCIN) 150 MG capsule  3 times daily     10/08/17 0444       Follow Up: Wyatt Portela, MD [place] [address] [phone]  Schedule an appointment as soon as possible for a visit  in 5-7 days, For close follow up     This chart was dictated using voice recognition software.  Despite best efforts to proofread,  errors can occur which can change the documentation meaning.   Fatima Blank, MD 10/08/17 [phone]

## 2017-10-08 NOTE — Progress Notes (Signed)
A consult was received from an ED physician for vancomycin and zosyn per pharmacy dosing.  The patient's profile has been reviewed for ht/wt/allergies/indication/available labs.   A one time order has been placed for vancomycin 2 gm and zosyn 3.375 mg IV.    Further antibiotics/pharmacy consults should be ordered by admitting physician if indicated.                       Thank you, Eudelia Bunch, Pharm.D. [phone] 10/08/2017 1:34 AM

## 2017-10-08 NOTE — ED Notes (Signed)
Patient transported to CT 

## 2017-10-08 NOTE — ED Notes (Signed)
Bed: TD97 Expected date:  Expected time:  Means of arrival:  Comments: EMS cancer patient/testicular swelling

## 2017-10-08 NOTE — ED Triage Notes (Signed)
Per EMS, pt. From home with complaint of swollen testicles x 2 days. Pt. Has penile cancer and last chemo  tx  was two weeks ago. A/O x 4. Pain at 8/10. Denied fever.

## 2017-10-12 NOTE — ED Notes (Signed)
2 SETS OF BLOOD CULTURES OBTAINED /COMPLETED AT THIS TIME  AS IT REFLECTED WHEN I STAT-LACTIC WAS COMPLETED.  1 SET BC OBTAINED FROM RIGHT ARM AND 2ND  SET  BC OBTAINED FROM LEFT ARM.  IVT ABT TO BE ADMINISTERED AS ORDERED.

## 2017-10-13 ENCOUNTER — Other Ambulatory Visit: Payer: Self-pay | Admitting: *Deleted

## 2017-10-13 ENCOUNTER — Telehealth: Payer: Self-pay | Admitting: Oncology

## 2017-10-13 DIAGNOSIS — C609 Malignant neoplasm of penis, unspecified: Secondary | ICD-10-CM

## 2017-10-13 LAB — CULTURE, BLOOD (ROUTINE X 2)
CULTURE: NO GROWTH
Culture: NO GROWTH
SPECIAL REQUESTS: ADEQUATE
Special Requests: ADEQUATE

## 2017-10-13 MED ORDER — OXYCODONE-ACETAMINOPHEN 10-325 MG PO TABS: 1.0000 | ORAL_TABLET | ORAL | 0 refills | Status: DC | PRN

## 2017-10-13 NOTE — Telephone Encounter (Signed)
Spoke w/ patient regarding moving his lab/flush appt from 7/11 to 7/10 due to availability of lab. Patient able to do so.  Confirmed dr/infusion appt for 7/11, as well.

## 2017-10-17 ENCOUNTER — Encounter (HOSPITAL_COMMUNITY): Payer: Self-pay

## 2017-10-17 ENCOUNTER — Ambulatory Visit (HOSPITAL_COMMUNITY): Payer: Medicaid Other

## 2017-10-17 ENCOUNTER — Ambulatory Visit (HOSPITAL_COMMUNITY)
Admission: RE | Admit: 2017-10-17 | Discharge: 2017-10-17 | Disposition: A | Discharge status: Home / Self Care | Payer: Medicaid Other | Source: Ambulatory Visit | Attending: Oncology | Admitting: Oncology

## 2017-10-17 DIAGNOSIS — C609 Malignant neoplasm of penis, unspecified: Secondary | ICD-10-CM | POA: Diagnosis present

## 2017-10-17 DIAGNOSIS — N329 Bladder disorder, unspecified: Secondary | ICD-10-CM | POA: Diagnosis not present

## 2017-10-17 DIAGNOSIS — I700 Atherosclerosis of aorta: Secondary | ICD-10-CM | POA: Diagnosis not present

## 2017-10-17 DIAGNOSIS — I890 Lymphedema, not elsewhere classified: Secondary | ICD-10-CM | POA: Insufficient documentation

## 2017-10-17 DIAGNOSIS — J439 Emphysema, unspecified: Secondary | ICD-10-CM | POA: Diagnosis not present

## 2017-10-17 DIAGNOSIS — R590 Localized enlarged lymph nodes: Secondary | ICD-10-CM | POA: Diagnosis not present

## 2017-10-17 DIAGNOSIS — C7800 Secondary malignant neoplasm of unspecified lung: Secondary | ICD-10-CM | POA: Insufficient documentation

## 2017-10-17 MED ORDER — IOPAMIDOL (ISOVUE-300) INJECTION 61%
INTRAVENOUS | Status: AC
  Filled 2017-10-17: qty 100

## 2017-10-17 MED ORDER — HEPARIN SOD (PORK) LOCK FLUSH 100 UNIT/ML IV SOLN
INTRAVENOUS | Status: AC
  Administered 2017-10-17: 500 [IU]
  Filled 2017-10-17: qty 5

## 2017-10-17 MED ORDER — IOPAMIDOL (ISOVUE-300) INJECTION 61%
100.0000 mL | Freq: Once | INTRAVENOUS | Status: AC | PRN
  Administered 2017-10-17: 100 mL via INTRAVENOUS

## 2017-10-17 MED ORDER — HEPARIN SOD (PORK) LOCK FLUSH 100 UNIT/ML IV SOLN: 500.0000 [IU] | Freq: Once | INTRAVENOUS | Status: DC

## 2017-10-18 ENCOUNTER — Inpatient Hospital Stay: Payer: Medicaid Other | Attending: Oncology

## 2017-10-18 ENCOUNTER — Inpatient Hospital Stay: Payer: Medicaid Other

## 2017-10-18 DIAGNOSIS — N4889 Other specified disorders of penis: Secondary | ICD-10-CM | POA: Diagnosis not present

## 2017-10-18 DIAGNOSIS — C609 Malignant neoplasm of penis, unspecified: Secondary | ICD-10-CM | POA: Insufficient documentation

## 2017-10-18 DIAGNOSIS — Z923 Personal history of irradiation: Secondary | ICD-10-CM | POA: Insufficient documentation

## 2017-10-18 DIAGNOSIS — C7800 Secondary malignant neoplasm of unspecified lung: Secondary | ICD-10-CM | POA: Insufficient documentation

## 2017-10-18 DIAGNOSIS — D509 Iron deficiency anemia, unspecified: Secondary | ICD-10-CM | POA: Diagnosis not present

## 2017-10-18 DIAGNOSIS — Z9221 Personal history of antineoplastic chemotherapy: Secondary | ICD-10-CM | POA: Insufficient documentation

## 2017-10-18 DIAGNOSIS — Z95828 Presence of other vascular implants and grafts: Secondary | ICD-10-CM

## 2017-10-18 DIAGNOSIS — Z7189 Other specified counseling: Secondary | ICD-10-CM

## 2017-10-18 LAB — CBC WITH DIFFERENTIAL/PLATELET
BASOS ABS: 0.1 10*3/uL (ref 0.0–0.1)
BASOS PCT: 1 %
Eosinophils Absolute: 0.4 10*3/uL (ref 0.0–0.5)
Eosinophils Relative: 3 %
HCT: 25.9 % — ABNORMAL LOW (ref 38.4–49.9)
HEMOGLOBIN: 8.3 g/dL — AB (ref 13.0–17.1)
Lymphocytes Relative: 4 %
Lymphs Abs: 0.6 10*3/uL — ABNORMAL LOW (ref 0.9–3.3)
MCH: 23.3 pg — ABNORMAL LOW (ref 27.2–33.4)
MCHC: 31.9 g/dL — ABNORMAL LOW (ref 32.0–36.0)
MCV: 73.1 fL — ABNORMAL LOW (ref 79.3–98.0)
Monocytes Absolute: 0.9 10*3/uL (ref 0.1–0.9)
Monocytes Relative: 6 %
NEUTROS ABS: 12.3 10*3/uL — AB (ref 1.5–6.5)
NEUTROS PCT: 86 %
Platelets: 526 10*3/uL — ABNORMAL HIGH (ref 140–400)
RBC: 3.55 MIL/uL — AB (ref 4.20–5.82)
RDW: 21.8 % — AB (ref 11.0–14.6)
WBC: 14.3 10*3/uL — AB (ref 4.0–10.3)

## 2017-10-18 LAB — COMPREHENSIVE METABOLIC PANEL
ALBUMIN: 2.9 g/dL — AB (ref 3.5–5.0)
ALT: 21 U/L (ref 0–44)
ANION GAP: 8 (ref 5–15)
AST: 18 U/L (ref 15–41)
Alkaline Phosphatase: 89 U/L (ref 38–126)
BILIRUBIN TOTAL: 0.2 mg/dL — AB (ref 0.3–1.2)
BUN: 12 mg/dL (ref 6–20)
CALCIUM: 9.9 mg/dL (ref 8.9–10.3)
CO2: 28 mmol/L (ref 22–32)
Chloride: 104 mmol/L (ref 98–111)
Creatinine, Ser: 1.43 mg/dL — ABNORMAL HIGH (ref 0.61–1.24)
GFR calc Af Amer: >60 mL/min (ref >60)
GFR calc non Af Amer: 60 mL/min — ABNORMAL LOW (ref >60)
GLUCOSE: 109 mg/dL — AB (ref 70–99)
POTASSIUM: 4 mmol/L (ref 3.5–5.1)
SODIUM: 140 mmol/L (ref 135–145)
TOTAL PROTEIN: 7.5 g/dL (ref 6.5–8.1)

## 2017-10-18 MED ORDER — SODIUM CHLORIDE 0.9% FLUSH
10.0000 mL | INTRAVENOUS | Status: DC | PRN
  Administered 2017-10-18: 10 mL
  Filled 2017-10-18: qty 10

## 2017-10-18 NOTE — Patient Instructions (Signed)
Implanted Port Home Guide An implanted port is a type of central line that is placed under the skin. Central lines are used to provide IV access when treatment or nutrition needs to be given through a person's veins. Implanted ports are used for long-term IV access. An implanted port may be placed because:  You need IV medicine that would be irritating to the small veins in your hands or arms.  You need long-term IV medicines, such as antibiotics.  You need IV nutrition for a long period.  You need frequent blood draws for lab tests.  You need dialysis.  Implanted ports are usually placed in the chest area, but they can also be placed in the upper arm, the abdomen, or the leg. An implanted port has two main parts:  Reservoir. The reservoir is round and will appear as a small, raised area under your skin. The reservoir is the part where a needle is inserted to give medicines or draw blood.  Catheter. The catheter is a thin, flexible tube that extends from the reservoir. The catheter is placed into a large vein. Medicine that is inserted into the reservoir goes into the catheter and then into the vein.  How will I care for my incision site? Do not get the incision site wet. Bathe or shower as directed by your health care provider. How is my port accessed? Special steps must be taken to access the port:  Before the port is accessed, a numbing cream can be placed on the skin. This helps numb the skin over the port site.  Your health care provider uses a sterile technique to access the port. ? Your health care provider must put on a mask and sterile gloves. ? The skin over your port is cleaned carefully with an antiseptic and allowed to dry. ? The port is gently pinched between sterile gloves, and a needle is inserted into the port.  Only "non-coring" port needles should be used to access the port. Once the port is accessed, a blood return should be checked. This helps ensure that the port  is in the vein and is not clogged.  If your port needs to remain accessed for a constant infusion, a clear (transparent) bandage will be placed over the needle site. The bandage and needle will need to be changed every week, or as directed by your health care provider.  Keep the bandage covering the needle clean and dry. Do not get it wet. Follow your health care provider's instructions on how to take a shower or bath while the port is accessed.  If your port does not need to stay accessed, no bandage is needed over the port.  What is flushing? Flushing helps keep the port from getting clogged. Follow your health care provider's instructions on how and when to flush the port. Ports are usually flushed with saline solution or a medicine called heparin. The need for flushing will depend on how the port is used.  If the port is used for intermittent medicines or blood draws, the port will need to be flushed: ? After medicines have been given. ? After blood has been drawn. ? As part of routine maintenance.  If a constant infusion is running, the port may not need to be flushed.  How long will my port stay implanted? The port can stay in for as long as your health care provider thinks it is needed. When it is time for the port to come out, surgery will be   done to remove it. The procedure is similar to the one performed when the port was put in. When should I seek immediate medical care? When you have an implanted port, you should seek immediate medical care if:  You notice a bad smell coming from the incision site.  You have swelling, redness, or drainage at the incision site.  You have more swelling or pain at the port site or the surrounding area.  You have a fever that is not controlled with medicine.  This information is not intended to replace advice given to you by your health care provider. Make sure you discuss any questions you have with your health care provider. Document  Released: 03/28/2005 Document Revised: 09/03/2015 Document Reviewed: 12/03/2012 Elsevier Interactive Patient Education  2017 [employer]  

## 2017-10-19 ENCOUNTER — Inpatient Hospital Stay (HOSPITAL_BASED_OUTPATIENT_CLINIC_OR_DEPARTMENT_OTHER): Payer: Medicaid Other | Admitting: Oncology

## 2017-10-19 ENCOUNTER — Other Ambulatory Visit: Payer: Self-pay

## 2017-10-19 ENCOUNTER — Ambulatory Visit: Payer: Self-pay

## 2017-10-19 ENCOUNTER — Inpatient Hospital Stay: Payer: Medicaid Other

## 2017-10-19 ENCOUNTER — Telehealth: Payer: Self-pay | Admitting: Oncology

## 2017-10-19 VITALS — BP 157/88 | HR 144 | Temp 99.6°F | Resp 22 | Ht 69.0 in | Wt 186.8 lb

## 2017-10-19 DIAGNOSIS — Z923 Personal history of irradiation: Secondary | ICD-10-CM

## 2017-10-19 DIAGNOSIS — C7800 Secondary malignant neoplasm of unspecified lung: Secondary | ICD-10-CM

## 2017-10-19 DIAGNOSIS — Z9221 Personal history of antineoplastic chemotherapy: Secondary | ICD-10-CM

## 2017-10-19 DIAGNOSIS — C609 Malignant neoplasm of penis, unspecified: Secondary | ICD-10-CM

## 2017-10-19 DIAGNOSIS — N4889 Other specified disorders of penis: Secondary | ICD-10-CM | POA: Diagnosis not present

## 2017-10-19 DIAGNOSIS — D509 Iron deficiency anemia, unspecified: Secondary | ICD-10-CM

## 2017-10-19 DIAGNOSIS — Z79899 Other long term (current) drug therapy: Secondary | ICD-10-CM

## 2017-10-19 MED ORDER — MORPHINE SULFATE ER 30 MG PO TBCR: 30.0000 mg | EXTENDED_RELEASE_TABLET | Freq: Two times a day (BID) | ORAL | 0 refills | Status: AC

## 2017-10-19 NOTE — Progress Notes (Signed)
Reviewed

## 2017-10-19 NOTE — Progress Notes (Signed)
Hematology and Oncology Follow Up Visit  Ronald Wilson [id number] 02-23-1976 42 y.o. 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  He was diagnosed in 2018 with high PDL 1 expression in his tumor.   Prior Therapy:   He is status post a biopsy of his lymphadenopathy in August 2018.  He is S/P cisplatin and 5-FU palliative chemotherapy.  He completed 3 cycles in November 2018 and developed progression of disease.  He is status post palliative radiation therapy between April 05, 2017 and April 20, 2017.  He received 30 Gy in 10 fractions to the pelvis and the periaortic areas.  He developed left upper extremity deep vein thrombosis and was started on Xarelto.  Current therapy:  Pembrolizumab 200 mg every 3 weeks started on July 19, 2017.  He has completed 4 cycles of therapy.   Interim History: Ronald Wilson is here for a follow-up visit.  Since the last visit, he continues to noticed overall decline in his health.  He has noticed increased swelling in his groin, testicles and penile shaft.  He was seen in the emergency department and prescribed antibiotics which he has completed at this time.  He continues to have issues with excessive fatigue, tiredness and mild dyspnea.  Still able to drive although he is experiencing more difficulties doing so.  He continues to have issues with pain and uses Percocet every 4 hours with poor pain control.  His pain is predominantly in his inguinal area and penis region.  His appetite has been marginal although his weight is stable.   He does not report any headaches, blurry vision, syncope or seizures.  He denies any confusion or neuropathy.  He does not report any fevers, chills or sweats.  He does not report any cough, wheezing or hemoptysis.  He does not report any  chest pain, palpitation.  He does not report any nausea vomiting or abdominal pain. He does not report any constipation, diarrhea or early satiety.  He does not report any hematochezia or melena. He does not report any hematuria or dysuria.  He does not report any frequency urgency or hesitancy.  He does not report any pathological fractures or bone pain.  He does not report any petechiae or ecchymosis.  He does not report any anxiety or mood disorder.  Remaining review of systems is negative.  Medications: I have reviewed the patient's current medications.  Current Outpatient Medications  Medication Sig Dispense Refill  . amLODipine (NORVASC) 10 MG tablet Take 1 tablet (10 mg total) by mouth daily. 30 tablet 5  . lidocaine-prilocaine (EMLA) cream Apply 1 application topically as needed. Apply to porta cath before chemotherapy. 30 g 0  . naproxen sodium (ALEVE) 220 MG tablet Take 220 mg by mouth daily as needed (pain).    [name] oxyCODONE-acetaminophen (PERCOCET) 10-325 MG tablet Take 1 tablet by mouth every 4 (four) hours as needed for pain. 60 tablet 0  . prochlorperazine (COMPAZINE) 10 MG tablet Take 1 tablet (10 mg total) by mouth every 6 (six) hours as needed for nausea or vomiting. 30 tablet 0   No current facility-administered medications for this visit.    Facility-Administered Medications Ordered in Other Visits  Medication Dose Route Frequency Provider Last Rate Last Dose  . heparin lock flush 100 unit/mL  500 Units Intracatheter Once PRN Wyatt Portela, MD      .  sodium chloride flush (NS) 0.9 % injection 10 mL  10 mL Intracatheter PRN Wyatt Portela, MD         Allergies: No Known Allergies  Past Medical History, Surgical history, Social history, and Family History reviewed today and unchanged.  [name] Physical Exam:  Blood pressure (!) 157/88, pulse (!) 144, temperature 99.6 F (37.6 C), temperature source Oral, resp. rate (!) 22, height 5\' 9"  (1.753 m), weight 186 lb 12.8 oz (84.7  kg), SpO2 96 %.   ECOG: 0 General appearance: Chronically ill-appearing gentleman without distress today. Head: Atraumatic without abnormalities. Oropharynx: Oral mucosa is dry without ulcers or thrush. Lymph nodes: Bulky adenopathy noted in the left inguinal area. heart: Regular rate without any murmurs or gallops.  No shifting dullness or ascites.  Tachycardic without murmurs. Lung: Clear in all lung fields without any wheezes or dullness to percussion. Abdomin: Soft, without any shifting dullness or ascites. GU exam: Edema noted in his testicle and penile shaft.  No erythema or drainage noted. Musculoskeletal: No joint deformity or effusion. Skin: No skin rashes or lesions. Neurological: No deficits noted motor, sensory exam.   Lab Results: Lab Results  Component Value Date   WBC 14.3 (H) 10/18/2017   HGB 8.3 (L) 10/18/2017   HCT 25.9 (L) 10/18/2017   MCV 73.1 (L) 10/18/2017   PLT 526 (H) 10/18/2017     Chemistry      Component Value Date/Time   NA 140 10/18/2017 1620   NA 140 03/13/2017 0827   K 4.0 10/18/2017 1620   K 3.6 03/13/2017 0827   CL 104 10/18/2017 1620   CO2 28 10/18/2017 1620   CO2 28 03/13/2017 0827   BUN 12 10/18/2017 1620   BUN 15.6 03/13/2017 0827   CREATININE 1.43 (H) 10/18/2017 1620   CREATININE 1.82 (H) 06/06/2017 1149   CREATININE 2.0 (H) 03/13/2017 0827      Component Value Date/Time   CALCIUM 9.9 10/18/2017 1620   CALCIUM 10.0 03/13/2017 0827   ALKPHOS 89 10/18/2017 1620   ALKPHOS 78 03/13/2017 0827   AST 18 10/18/2017 1620   AST 14 06/06/2017 1149   AST 15 03/13/2017 0827   ALT 21 10/18/2017 1620   ALT 9 06/06/2017 1149   ALT 10 03/13/2017 0827   BILITOT 0.2 (L) 10/18/2017 1620   BILITOT 0.3 06/06/2017 1149   BILITOT 0.22 03/13/2017 0827     EXAM: CT CHEST, ABDOMEN, AND PELVIS WITH CONTRAST  TECHNIQUE: Multidetector CT imaging of the chest, abdomen and pelvis was performed following the standard protocol during  bolus administration of intravenous contrast.  CONTRAST:  159mL ISOVUE-300 IOPAMIDOL (ISOVUE-300) INJECTION 61%  COMPARISON:  Abdominopelvic CT 10/08/2017. Most recent chest CT 05/24/2017. Chest radiograph 06/25/2017 reviewed.  FINDINGS: CT CHEST FINDINGS  Cardiovascular: A right-sided Port-A-Cath which terminates at the mid right atrium. Normal heart size with trace pericardial fluid, favored to be physiologic. Normal aortic caliber. No central pulmonary embolism, on this non-dedicated study.  Mediastinum/Nodes: Necrotic adenopathy in the low left jugular/supraclavicular station. Index node medially measures 1.7 cm today on 4/2 versus 1.3 cm on the prior. A high left mediastinal node measures 1.6 cm on 12/02 versus 1.5 cm on the prior.  No hilar adenopathy. Retrocrural node of 1.4 cm on 48/2 is enlarged from 1.3 cm on the prior.  Lungs/Pleura: No pleural fluid. Mild centrilobular emphysema. No consolidation. Since the prior chest CT, interval development of multiple bilateral pulmonary nodules, consistent with metastatic disease.  -  left upper lobe  pulmonary nodule of 9 mm on 42/6.  -right lower lobe pulmonary nodule of 12 mm, new since the comparison chest CT and the abdominal CT of 05/24/2017.  Musculoskeletal: Mild right worse than left gynecomastia. C7 vertebral body height loss with suggestion of underlying sclerosis. This is suboptimally evaluated on sagittal image 80. New since 05/24/2017.  CT ABDOMEN PELVIS FINDINGS  Hepatobiliary: A too small to characterize right hepatic lobe low-density lesion is similar including on image 55/2. Focal steatosis adjacent the falciform ligament. Normal gallbladder, without biliary ductal dilatation.  Pancreas: Normal, without mass or ductal dilatation.  Spleen: Normal in size, without focal abnormality.  Adrenals/Urinary Tract: Normal adrenal glands. Normal kidneys, without hydronephrosis. The urinary  bladder appears thick walled. This is at least partially felt to be due to underdistention. This is new compared to the exam of 10 days ago.  Stomach/Bowel: Normal stomach, without wall thickening. Normal colon, appendix, and terminal ileum. Normal small bowel.  Vascular/Lymphatic: Aortic atherosclerosis. Massive abdominopelvic adenopathy. Index left periaortic node measures 3.5 x 3.3 cm on 70/2. Compare 3.1 x 3.5 cm on 10/08/2017. Necrotic right inguinal node of 4.5 x 4.7 cm on 119/2. Compare 4.3 x 4.7 cm on the prior.  Index left inguinal node measures 4.3 x 4.7 cm on 120/2 versus 3.9 x 4.8 cm on the prior.  Reproductive: Normal prostate. Bilateral hydroceles are moderate and similar. Soft tissue fullness throughout the distal penis is likely indicative of the primary.  Other: No significant free fluid. Subcutaneous and skin edema throughout the left lower extremity, similar.  Musculoskeletal: No acute osseous abnormality.  IMPRESSION: CT CHEST IMPRESSION  1. Since 05/24/2017 chest CT, significant progression of disease. 2. New widespread pulmonary metastasis. 3. Progressive thoracic adenopathy. 4. Suspicion of pathologic fracture at the C7 level, suboptimally evaluated. Consider dedicated pre and post contrast cervical spine MRI.  CT ABDOMEN AND PELVIS IMPRESSION  1. Since 10/08/2017 abdominopelvic CT, relatively similar massive abdominopelvic adenopathy. Similar lymphedema involving the left lower extremity. 2. Bladder wall thickening which could be at least partially due to underdistention. Correlate with any symptoms to suggest cystitis. 3. Aortic atherosclerosis (ICD10-I70.0) and emphysema (ICD10-J43.9).   Impression and Plan:  42 year old man with:   1.  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  He has progressed on cisplatin chemotherapy rather rapidly and currently  receiving Pembrolizumab  CT scan obtained on 10/17/2017 was personally reviewed today and discussed with the patient.  His imaging studies showed clear progression of disease in the lung as well as adenopathy.  The natural course of this disease was reviewed today and treatment options were discussed.  Unfortunately, he has progressed rather rapidly on cisplatin based therapies and did not respond to immune therapy.  Additional salvage regimens utilizing carboplatin and paclitaxel or Panitumumab could be an option but I am not optimistic about his ability to respond.  Alternatively, supportive care and hospice care was also discussed today.  His performance status is deteriorating and despite his young age, but supportive care may be his better option.  After discussion today, we have elected to hold off on any treatment at the time being and monitor him closely and consider different salvage therapy in the future.  I have also urged him to consider hospice as a better option moving forward.  He has a complex social situation and will likely require further assistant in the immediate future.   2. IV access: Port-A-Cath will be  flushed periodically.  3. Renal function surveillance: Creatinine remains stable.  5.  Anemia: Related to iron deficiency and received intravenous iron infusion.  We will continue to monitor his iron levels.  6.  Scrotal edema and penile edema: This is related to malignancy.  He was treated with oral antibiotics without any major changes.  7.  Pain: We have discussed the strategy of using long-acting pain medication.  Started him on morphine at 30 mg twice a day and he will use Percocet as breakthrough.  8.  Prognosis: His prognosis is poor with limited life expectancy of less than 6 months.  He is hospice eligible and we will continue to address this option with him.  9. Follow-up: Will be in 4 weeks to follow his progress.  25  minutes was spent with the patient  face-to-face today.  More than 50% of time was dedicated to patient counseling, education and discussing the natural course of this disease as well as alternative treatment options.   Zola Button, MD 7/11/20199:23 AM

## 2017-10-19 NOTE — Telephone Encounter (Signed)
Scheduled appt per 7/11 los - gave patient AVS and calender per los - 

## 2017-10-23 ENCOUNTER — Other Ambulatory Visit: Payer: Self-pay | Admitting: *Deleted

## 2017-10-23 DIAGNOSIS — C609 Malignant neoplasm of penis, unspecified: Secondary | ICD-10-CM

## 2017-10-23 MED ORDER — OXYCODONE-ACETAMINOPHEN 10-325 MG PO TABS: 1.0000 | ORAL_TABLET | ORAL | 0 refills | Status: DC | PRN

## 2017-10-25 ENCOUNTER — Encounter (HOSPITAL_BASED_OUTPATIENT_CLINIC_OR_DEPARTMENT_OTHER): Payer: Medicaid Other | Attending: Physician Assistant

## 2017-10-25 ENCOUNTER — Other Ambulatory Visit (HOSPITAL_COMMUNITY)
Admission: RE | Admit: 2017-10-25 | Discharge: 2017-10-25 | Disposition: A | Discharge status: Home / Self Care | Payer: Medicaid Other | Source: Other Acute Inpatient Hospital | Attending: Physician Assistant | Admitting: Physician Assistant

## 2017-10-25 DIAGNOSIS — C601 Malignant neoplasm of glans penis: Secondary | ICD-10-CM | POA: Diagnosis not present

## 2017-10-25 DIAGNOSIS — I10 Essential (primary) hypertension: Secondary | ICD-10-CM | POA: Diagnosis not present

## 2017-10-25 DIAGNOSIS — D573 Sickle-cell trait: Secondary | ICD-10-CM | POA: Insufficient documentation

## 2017-10-25 DIAGNOSIS — Z8589 Personal history of malignant neoplasm of other organs and systems: Secondary | ICD-10-CM | POA: Diagnosis not present

## 2017-10-25 DIAGNOSIS — F17200 Nicotine dependence, unspecified, uncomplicated: Secondary | ICD-10-CM | POA: Diagnosis not present

## 2017-10-25 DIAGNOSIS — S31104A Unspecified open wound of abdominal wall, left lower quadrant without penetration into peritoneal cavity, initial encounter: Secondary | ICD-10-CM | POA: Diagnosis not present

## 2017-10-25 DIAGNOSIS — S31104D Unspecified open wound of abdominal wall, left lower quadrant without penetration into peritoneal cavity, subsequent encounter: Secondary | ICD-10-CM | POA: Insufficient documentation

## 2017-10-25 DIAGNOSIS — Z923 Personal history of irradiation: Secondary | ICD-10-CM | POA: Insufficient documentation

## 2017-10-25 DIAGNOSIS — X58XXXA Exposure to other specified factors, initial encounter: Secondary | ICD-10-CM | POA: Insufficient documentation

## 2017-10-25 DIAGNOSIS — Z86718 Personal history of other venous thrombosis and embolism: Secondary | ICD-10-CM | POA: Insufficient documentation

## 2017-10-25 DIAGNOSIS — Z9221 Personal history of antineoplastic chemotherapy: Secondary | ICD-10-CM | POA: Diagnosis not present

## 2017-10-25 DIAGNOSIS — X58XXXD Exposure to other specified factors, subsequent encounter: Secondary | ICD-10-CM | POA: Insufficient documentation

## 2017-10-28 LAB — AEROBIC CULTURE  (SUPERFICIAL SPECIMEN)

## 2017-10-28 LAB — AEROBIC CULTURE W GRAM STAIN (SUPERFICIAL SPECIMEN)

## 2017-11-03 ENCOUNTER — Other Ambulatory Visit: Payer: Self-pay | Admitting: *Deleted

## 2017-11-03 DIAGNOSIS — C609 Malignant neoplasm of penis, unspecified: Secondary | ICD-10-CM

## 2017-11-03 MED ORDER — OXYCODONE-ACETAMINOPHEN 10-325 MG PO TABS: 1.0000 | ORAL_TABLET | ORAL | 0 refills | Status: AC | PRN

## 2017-11-08 ENCOUNTER — Encounter (HOSPITAL_COMMUNITY): Payer: Self-pay | Admitting: Emergency Medicine

## 2017-11-08 ENCOUNTER — Inpatient Hospital Stay (HOSPITAL_COMMUNITY): Payer: Medicaid Other

## 2017-11-08 ENCOUNTER — Inpatient Hospital Stay (HOSPITAL_COMMUNITY)
Admission: EM | Admit: 2017-11-08 | Discharge: 2017-12-10 | DRG: 872 | Disposition: E | Payer: Medicaid Other | Attending: Internal Medicine | Admitting: Internal Medicine

## 2017-11-08 ENCOUNTER — Other Ambulatory Visit: Payer: Self-pay

## 2017-11-08 DIAGNOSIS — R102 Pelvic and perineal pain: Secondary | ICD-10-CM | POA: Diagnosis present

## 2017-11-08 DIAGNOSIS — T148XXA Other injury of unspecified body region, initial encounter: Secondary | ICD-10-CM | POA: Diagnosis present

## 2017-11-08 DIAGNOSIS — R1909 Other intra-abdominal and pelvic swelling, mass and lump: Secondary | ICD-10-CM | POA: Diagnosis present

## 2017-11-08 DIAGNOSIS — Z79891 Long term (current) use of opiate analgesic: Secondary | ICD-10-CM | POA: Diagnosis not present

## 2017-11-08 DIAGNOSIS — Z95828 Presence of other vascular implants and grafts: Secondary | ICD-10-CM

## 2017-11-08 DIAGNOSIS — Z86718 Personal history of other venous thrombosis and embolism: Secondary | ICD-10-CM

## 2017-11-08 DIAGNOSIS — Z9221 Personal history of antineoplastic chemotherapy: Secondary | ICD-10-CM | POA: Diagnosis not present

## 2017-11-08 DIAGNOSIS — Z7189 Other specified counseling: Secondary | ICD-10-CM | POA: Diagnosis not present

## 2017-11-08 DIAGNOSIS — R6520 Severe sepsis without septic shock: Secondary | ICD-10-CM | POA: Diagnosis present

## 2017-11-08 DIAGNOSIS — A419 Sepsis, unspecified organism: Principal | ICD-10-CM | POA: Diagnosis present

## 2017-11-08 DIAGNOSIS — Z923 Personal history of irradiation: Secondary | ICD-10-CM

## 2017-11-08 DIAGNOSIS — D649 Anemia, unspecified: Secondary | ICD-10-CM | POA: Diagnosis present

## 2017-11-08 DIAGNOSIS — D573 Sickle-cell trait: Secondary | ICD-10-CM | POA: Diagnosis present

## 2017-11-08 DIAGNOSIS — R590 Localized enlarged lymph nodes: Secondary | ICD-10-CM | POA: Diagnosis present

## 2017-11-08 DIAGNOSIS — Z79899 Other long term (current) drug therapy: Secondary | ICD-10-CM | POA: Diagnosis not present

## 2017-11-08 DIAGNOSIS — E872 Acidosis, unspecified: Secondary | ICD-10-CM | POA: Diagnosis present

## 2017-11-08 DIAGNOSIS — I10 Essential (primary) hypertension: Secondary | ICD-10-CM | POA: Diagnosis present

## 2017-11-08 DIAGNOSIS — Z515 Encounter for palliative care: Secondary | ICD-10-CM | POA: Diagnosis present

## 2017-11-08 DIAGNOSIS — C609 Malignant neoplasm of penis, unspecified: Secondary | ICD-10-CM | POA: Diagnosis not present

## 2017-11-08 DIAGNOSIS — D72829 Elevated white blood cell count, unspecified: Secondary | ICD-10-CM

## 2017-11-08 DIAGNOSIS — Z66 Do not resuscitate: Secondary | ICD-10-CM | POA: Diagnosis present

## 2017-11-08 DIAGNOSIS — N485 Ulcer of penis: Secondary | ICD-10-CM | POA: Diagnosis present

## 2017-11-08 DIAGNOSIS — E860 Dehydration: Secondary | ICD-10-CM | POA: Diagnosis present

## 2017-11-08 DIAGNOSIS — N179 Acute kidney failure, unspecified: Secondary | ICD-10-CM | POA: Diagnosis present

## 2017-11-08 DIAGNOSIS — C7800 Secondary malignant neoplasm of unspecified lung: Secondary | ICD-10-CM | POA: Diagnosis present

## 2017-11-08 DIAGNOSIS — D72823 Leukemoid reaction: Secondary | ICD-10-CM | POA: Diagnosis present

## 2017-11-08 DIAGNOSIS — F17210 Nicotine dependence, cigarettes, uncomplicated: Secondary | ICD-10-CM | POA: Diagnosis present

## 2017-11-08 DIAGNOSIS — C799 Secondary malignant neoplasm of unspecified site: Secondary | ICD-10-CM

## 2017-11-08 LAB — COMPREHENSIVE METABOLIC PANEL
ALBUMIN: 2.8 g/dL — AB (ref 3.5–5.0)
ALT: 11 U/L (ref 0–44)
AST: 16 U/L (ref 15–41)
Alkaline Phosphatase: 161 U/L — ABNORMAL HIGH (ref 38–126)
Anion gap: 10 (ref 5–15)
BUN: 30 mg/dL — AB (ref 6–20)
CHLORIDE: 97 mmol/L — AB (ref 98–111)
CO2: 29 mmol/L (ref 22–32)
CREATININE: 1.89 mg/dL — AB (ref 0.61–1.24)
Calcium: >15 mg/dL (ref 8.9–10.3)
GFR calc Af Amer: 49 mL/min — ABNORMAL LOW (ref >60)
GFR, EST NON AFRICAN AMERICAN: 43 mL/min — AB (ref >60)
Glucose, Bld: 123 mg/dL — ABNORMAL HIGH (ref 70–99)
POTASSIUM: 4.9 mmol/L (ref 3.5–5.1)
SODIUM: 136 mmol/L (ref 135–145)
Total Bilirubin: 0.2 mg/dL — ABNORMAL LOW (ref 0.3–1.2)
Total Protein: 7.6 g/dL (ref 6.5–8.1)

## 2017-11-08 LAB — URINALYSIS, ROUTINE W REFLEX MICROSCOPIC
Bilirubin Urine: NEGATIVE
GLUCOSE, UA: NEGATIVE mg/dL
Ketones, ur: NEGATIVE mg/dL
NITRITE: NEGATIVE
Protein, ur: 30 mg/dL — AB
RBC / HPF: >50 RBC/hpf — ABNORMAL HIGH (ref 0–5)
SPECIFIC GRAVITY, URINE: 1.015 (ref 1.005–1.030)
pH: 5 (ref 5.0–8.0)

## 2017-11-08 LAB — CBC WITH DIFFERENTIAL/PLATELET
BASOS ABS: 0.1 10*3/uL (ref 0.0–0.1)
BASOS PCT: 0 %
Eosinophils Absolute: 0 10*3/uL (ref 0.0–0.7)
Eosinophils Relative: 0 %
HEMATOCRIT: 29.9 % — AB (ref 39.0–52.0)
Hemoglobin: 9.3 g/dL — ABNORMAL LOW (ref 13.0–17.0)
LYMPHS PCT: 2 %
Lymphs Abs: 0.9 10*3/uL (ref 0.7–4.0)
MCH: 22.6 pg — ABNORMAL LOW (ref 26.0–34.0)
MCHC: 31.1 g/dL (ref 30.0–36.0)
MCV: 72.6 fL — ABNORMAL LOW (ref 78.0–100.0)
MONO ABS: 1.9 10*3/uL — AB (ref 0.1–1.0)
MONOS PCT: 4 %
NEUTROS ABS: 48.8 10*3/uL — AB (ref 1.7–7.7)
NEUTROS PCT: 94 %
Platelets: 361 10*3/uL (ref 150–400)
RBC: 4.12 MIL/uL — ABNORMAL LOW (ref 4.22–5.81)
RDW: 18.8 % — ABNORMAL HIGH (ref 11.5–15.5)
WBC: 51.6 10*3/uL (ref 4.0–10.5)

## 2017-11-08 LAB — PROTIME-INR
INR: 0.99
Prothrombin Time: 13 seconds (ref 11.4–15.2)

## 2017-11-08 LAB — I-STAT CG4 LACTIC ACID, ED
LACTIC ACID, VENOUS: 2.37 mmol/L — AB (ref 0.5–1.9)
Lactic Acid, Venous: 1.65 mmol/L (ref 0.5–1.9)

## 2017-11-08 MED ORDER — AMLODIPINE BESYLATE 10 MG PO TABS
10.0000 mg | ORAL_TABLET | Freq: Every day | ORAL | Status: DC
  Administered 2017-11-09 – 2017-11-13 (×5): 10 mg via ORAL
  Filled 2017-11-08 (×5): qty 1

## 2017-11-08 MED ORDER — ONDANSETRON HCL 4 MG PO TABS: 4.0000 mg | ORAL_TABLET | Freq: Four times a day (QID) | ORAL | Status: DC | PRN

## 2017-11-08 MED ORDER — OXYCODONE-ACETAMINOPHEN 5-325 MG PO TABS
1.0000 | ORAL_TABLET | Freq: Once | ORAL | Status: AC
  Administered 2017-11-08: 1 via ORAL
  Filled 2017-11-08: qty 1

## 2017-11-08 MED ORDER — ENOXAPARIN SODIUM 40 MG/0.4ML ~~LOC~~ SOLN
40.0000 mg | Freq: Every day | SUBCUTANEOUS | Status: DC
  Administered 2017-11-11 – 2017-11-12 (×2): 40 mg via SUBCUTANEOUS
  Filled 2017-11-08 (×4): qty 0.4

## 2017-11-08 MED ORDER — MORPHINE SULFATE ER 30 MG PO TBCR
30.0000 mg | EXTENDED_RELEASE_TABLET | Freq: Two times a day (BID) | ORAL | Status: DC
  Administered 2017-11-08 – 2017-11-13 (×11): 30 mg via ORAL
  Filled 2017-11-08 (×11): qty 1

## 2017-11-08 MED ORDER — OXYCODONE HCL 5 MG PO TABS
5.0000 mg | ORAL_TABLET | ORAL | Status: DC | PRN
  Administered 2017-11-11 – 2017-11-14 (×4): 5 mg via ORAL
  Filled 2017-11-08 (×4): qty 1

## 2017-11-08 MED ORDER — SODIUM CHLORIDE 0.9 % IV BOLUS
3000.0000 mL | Freq: Once | INTRAVENOUS | Status: AC
  Administered 2017-11-08: 3000 mL via INTRAVENOUS

## 2017-11-08 MED ORDER — SENNOSIDES-DOCUSATE SODIUM 8.6-50 MG PO TABS: 1.0000 | ORAL_TABLET | Freq: Every evening | ORAL | Status: DC | PRN

## 2017-11-08 MED ORDER — ACETAMINOPHEN 650 MG RE SUPP: 650.0000 mg | Freq: Four times a day (QID) | RECTAL | Status: DC | PRN

## 2017-11-08 MED ORDER — OXYCODONE-ACETAMINOPHEN 5-325 MG PO TABS
1.0000 | ORAL_TABLET | ORAL | Status: DC | PRN
Start: 2017-11-08 — End: 2017-11-14
  Administered 2017-11-11 – 2017-11-14 (×3): 1 via ORAL
  Filled 2017-11-08 (×3): qty 1

## 2017-11-08 MED ORDER — PROCHLORPERAZINE MALEATE 10 MG PO TABS: 10.0000 mg | ORAL_TABLET | Freq: Four times a day (QID) | ORAL | Status: DC | PRN

## 2017-11-08 MED ORDER — OXYCODONE-ACETAMINOPHEN 10-325 MG PO TABS: 1.0000 | ORAL_TABLET | ORAL | Status: DC | PRN

## 2017-11-08 MED ORDER — PIPERACILLIN-TAZOBACTAM 4.5 G IVPB: 4.5000 g | Freq: Three times a day (TID) | INTRAVENOUS | Status: DC

## 2017-11-08 MED ORDER — HYDROMORPHONE HCL 1 MG/ML IJ SOLN
1.0000 mg | INTRAMUSCULAR | Status: DC | PRN
Start: 2017-11-08 — End: 2017-11-14
  Administered 2017-11-09 – 2017-11-14 (×28): 1 mg via INTRAVENOUS
  Filled 2017-11-08 (×28): qty 1

## 2017-11-08 MED ORDER — PIPERACILLIN-TAZOBACTAM 3.375 G IVPB
3.3750 g | Freq: Three times a day (TID) | INTRAVENOUS | Status: DC
  Administered 2017-11-09 – 2017-11-13 (×14): 3.375 g via INTRAVENOUS
  Filled 2017-11-08 (×15): qty 50

## 2017-11-08 MED ORDER — ONDANSETRON HCL 4 MG/2ML IJ SOLN
4.0000 mg | Freq: Four times a day (QID) | INTRAMUSCULAR | Status: DC | PRN
  Administered 2017-11-09 – 2017-11-13 (×5): 4 mg via INTRAVENOUS
  Filled 2017-11-08 (×5): qty 2

## 2017-11-08 MED ORDER — ACETAMINOPHEN 325 MG PO TABS: 650.0000 mg | ORAL_TABLET | Freq: Four times a day (QID) | ORAL | Status: DC | PRN

## 2017-11-08 NOTE — ED Provider Notes (Signed)
[facility] DEPT Provider Note   CSN: [id number] Arrival date & time: 10/12/2017  1845     History   Chief Complaint Chief Complaint  Patient presents with  . Abdominal Pain  . Groin Pain    HPI Ronald Wilson is a 42 y.o. male.  HPI  Patient presents for evaluation of abdominal pain, groin pain and vomiting.  He has been ill for 2 days causing him to present for evaluation by EMS.  Patient was seen in the ED about 1 month ago at that time he was felt to have a penis infection, and was treated with antibiotics.  He saw his oncologist 2 weeks ago at that time decision was made to start treating cancer aggressively with chemotherapy, and hospice was recommended.  Apparently patient has not yet been evaluated by hospice.  He did have a palliative care consult in March 2019.  He is here today because of problems with the penis, he is leaking urine from sores on the left side of his penis.  He is also normal skin eruptions of the left groin in the last week.  He has had periods of feeling chills and hot in the last few days.  He denies cough or shortness of breath.  He is not currently getting home health.  He lives with his father who is debilitated.  His father does not know about his diagnosis, yet.  There are no other known modifying factors.   Past Medical History:  Diagnosis Date  . Hypertension   . Inguinal mass 11/17/2016   left  . Sickle cell trait ([place])   . squamous cell penile ca dx'd 11/2015    Patient Active Problem List   Diagnosis Date Noted  . Leukocytosis 10/27/2017  . Lactic acidosis 10/17/2017  . Port-A-Cath in place 07/19/2017  . Wound, open 06/28/2017  . Palliative care by specialist   . 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 Sepsis secondary to UTI ([place]) 06/23/2017  . Anemia associated with chemotherapy 06/23/2017  . DVT (deep venous thrombosis) ([place]) 06/23/2017  . Sepsis ([place]) 06/23/2017  . Goals of care, counseling/discussion  12/16/2016  . 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 Inguinal lymphadenopathy   . Inguinal mass 11/17/2016  . 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 Anemia 11/17/2016  . Unilateral edema of left lower extremity 11/17/2016  . Tobacco use disorder 11/17/2016    Past Surgical History:  Procedure Laterality Date  . IR FLUORO GUIDE PORT INSERTION RIGHT  12/26/2016  . IR US GUIDE VASC ACCESS RIGHT  12/26/2016  . NO PAST SURGERIES          Home Medications    Prior to Admission medications   Medication Sig Start Date End Date Taking? Authorizing Provider  lidocaine-prilocaine (EMLA) cream Apply 1 application topically as needed. Apply to porta cath before chemotherapy. 12/16/16  Yes Wyatt Portela, MD  morphine (MS CONTIN) 30 MG 12 hr tablet Take 1 tablet (30 mg total) by mouth every 12 (twelve) hours. 10/19/17  Yes Wyatt Portela, MD  oxyCODONE-acetaminophen (PERCOCET) 10-325 MG tablet Take 1 tablet by mouth every 4 (four) hours as needed for pain. 11/03/17  Yes Wyatt Portela, MD  amLODipine (NORVASC) 10 MG tablet Take 1 tablet (10 mg total) by mouth daily. Patient not taking: Reported on 11/01/2017 07/13/17   Wyatt Portela, MD  naproxen sodium (ALEVE) 220 MG tablet Take 220 mg by mouth daily as needed (pain).    [provider]  prochlorperazine (COMPAZINE) 10 MG tablet Take 1 tablet (10 mg total) by mouth every 6 (six) hours as needed for nausea or vomiting. 07/13/17   Wyatt Portela, MD    Family History Family History  Problem Relation Age of Onset  . Hypertension Mother   . Cancer Maternal Aunt        unknown    Social History Social History   Tobacco Use  . Smoking status: Current Every Day Smoker    Packs/day: 0.25    Years: 25.00    Pack years: 6.25    Types: Cigarettes  . Smokeless tobacco: Never Used  Substance Use Topics  . Alcohol use: Yes    Alcohol/week: 9.0 oz    Types: 15 Shots of liquor per week    Comment: "11/17/2016 "I'll have shots ~ 3 days/wk"  . Drug use:  Yes    Types: Marijuana    Comment: 11/17/2016 "daily"     Allergies   Patient has no known allergies.   Review of Systems Review of Systems  All other systems reviewed and are negative.    Physical Exam Updated Vital Signs BP (!) 168/98 (BP Location: Left Arm)   Pulse (!) 134   Temp 97.7 F (36.5 C) (Oral)   Resp (!) 22   Ht _0  (1.753 m)   Wt 83.9 kg (185 lb)   SpO2 99%   BMI 27.32 kg/m   Physical Exam  Constitutional: He is oriented to person, place, and time. He appears well-developed and well-nourished. He appears ill.  HENT:  Head: Normocephalic and atraumatic.  Right Ear: External ear normal.  Left Ear: External ear normal.  Eyes: Pupils are equal, round, and reactive to light. Conjunctivae and EOM are normal.  Neck: Normal range of motion and phonation normal. Neck supple.  Cardiovascular: Normal rate, regular rhythm and normal heart sounds.  Pulmonary/Chest: Effort normal and breath sounds normal. He exhibits no bony tenderness.  Abdominal: Soft. There is no tenderness.  Genitourinary:  Genitourinary Comments: Penis and scrotum are swollen and deformed in appearance.  Distal penis is eroded, without visible glands.  Scattered red papular lesions, on penis and left groin region, consistent with cancer tumor growth.  Musculoskeletal: Normal range of motion.  Neurological: He is alert and oriented to person, place, and time. No cranial nerve deficit or sensory deficit. He exhibits normal muscle tone. Coordination normal.  Skin: Skin is warm, dry and intact.  Psychiatric: He has a normal mood and affect. His behavior is normal. Judgment and thought content normal.  Nursing note and vitals reviewed.    ED Treatments / Results  Labs (all labs ordered are listed, but only abnormal results are displayed) Labs Reviewed  COMPREHENSIVE METABOLIC PANEL - Abnormal; Notable for the following components:      Result Value   Chloride 97 (*)    Glucose, Bld 123 (*)      BUN 30 (*)    Creatinine, Ser 1.89 (*)    Calcium >15.0 (*)    Albumin 2.8 (*)    Alkaline Phosphatase 161 (*)    Total Bilirubin 0.2 (*)    GFR calc non Af Amer 43 (*)    GFR calc Af Amer 49 (*)    All other components within normal limits  CBC WITH DIFFERENTIAL/PLATELET - Abnormal; Notable for the following components:   WBC 51.6 (*)    RBC 4.12 (*)    Hemoglobin 9.3 (*)    HCT 29.9 (*)  MCV 72.6 (*)    MCH 22.6 (*)    RDW 18.8 (*)    Neutro Abs 48.8 (*)    Monocytes Absolute 1.9 (*)    All other components within normal limits  URINALYSIS, ROUTINE W REFLEX MICROSCOPIC - Abnormal; Notable for the following components:   APPearance CLOUDY (*)    Hgb urine dipstick LARGE (*)    Protein, ur 30 (*)    Leukocytes, UA LARGE (*)    RBC / HPF >50 (*)    WBC, UA >50 (*)    Bacteria, UA FEW (*)    Non Squamous Epithelial 0-5 (*)    All other components within normal limits  I-STAT CG4 LACTIC ACID, ED - Abnormal; Notable for the following components:   Lactic Acid, Venous 2.37 (*)    All other components within normal limits  CULTURE, BLOOD (ROUTINE X 2)  CULTURE, BLOOD (ROUTINE X 2)  URINE CULTURE  PROTIME-INR  BASIC METABOLIC PANEL  CBC  I-STAT CG4 LACTIC ACID, ED    EKG None  Radiology Dg Chest 2 View  Result Date: 10/14/2017 CLINICAL DATA:  Sepsis, fever, cancer patient EXAM: CHEST - 2 VIEW COMPARISON:  CT chest dated 10/17/2017 FINDINGS: At least 3 left upper lobe pulmonary nodules, better evaluated on recent CT. Additional pulmonary metastases are not radiographically evident. No focal consolidation.  No pleural effusion or pneumothorax. Heart is normal in size. Right chest power port terminating at the cavoatrial junction. IMPRESSION: At least 3 left upper lobe pulmonary nodules, with additional pulmonary metastases better evaluated on recent CT. No evidence of acute cardiopulmonary disease. Electronically Signed   By: Julian Hy M.D.   On: 11/05/2017 23:40     Procedures Procedures (including critical care time)  Medications Ordered in ED Medications  amLODipine (NORVASC) tablet 10 mg (has no administration in time range)  morphine (MS CONTIN) 12 hr tablet 30 mg (30 mg Oral Given 10/25/2017 2356)  prochlorperazine (COMPAZINE) tablet 10 mg (has no administration in time range)  enoxaparin (LOVENOX) injection 40 mg (40 mg Subcutaneous Not Given 10/11/2017 2358)  HYDROmorphone (DILAUDID) injection 1 mg (1 mg Intravenous Given 11/28/2017 0009)  acetaminophen (TYLENOL) tablet 650 mg (has no administration in time range)    Or  acetaminophen (TYLENOL) suppository 650 mg (has no administration in time range)  senna-docusate (Senokot-S) tablet 1 tablet (has no administration in time range)  ondansetron (ZOFRAN) tablet 4 mg (has no administration in time range)    Or  ondansetron (ZOFRAN) injection 4 mg (has no administration in time range)  oxyCODONE-acetaminophen (PERCOCET/ROXICET) 5-325 MG per tablet 1 tablet (has no administration in time range)    And  oxyCODONE (Oxy IR/ROXICODONE) immediate release tablet 5 mg (has no administration in time range)  piperacillin-tazobactam (ZOSYN) IVPB 3.375 g (3.375 g Intravenous New Bag/Given 11/25/2017 0008)  sodium chloride 0.9 % bolus 3,000 mL (3,000 mLs Intravenous New Bag/Given 10/26/2017 2057)  oxyCODONE-acetaminophen (PERCOCET/ROXICET) 5-325 MG per tablet 1 tablet (1 tablet Oral Given 11/04/2017 2201)     Initial Impression / Assessment and Plan / ED Course  I have reviewed the triage vital signs and the nursing notes.  Pertinent labs & imaging results that were available during my care of the patient were reviewed by me and considered in my medical decision making (see chart for details).  Clinical Course as of Nov 10 10  Wed Nov 08, 2017  2048 Findings discussed with the patient.  We discussed the progression and current status of his  metastatic cancer.  He understands the need to confer with hospice regarding  care.  He would like to be DNR, no CODE BLUE.  He understands the implications of resuscitation, and the lack of this to cause him to recover from cancer.   [EW]  2146 Normal except chloride low, glucose high, BUN high, creatinine high, calcium very high, albumin low, alk phos stays high, total bilirubin low, GFR low  Comprehensive metabolic panel(!!) [EW]  1093 High  Lactic Acid, Venous(!!): 2.37 [EW]  2147 CBC with Differential(!!) [EW]  2147 Abnormal, WBC very high, hemoglobin low, hematocrit low  CBC with Differential(!!) [EW]  2147 Abnormal, hemoglobin large, protein, leukocyte large, WBC clumps present, mucus present  Urinalysis, Routine w reflex microscopic(!) [EW]    Clinical Course User Index [EW] Daleen Bo, MD    Patient Vitals for the past 24 hrs:  BP Temp Temp src Pulse Resp SpO2 Height Weight  11/05/2017 2351 (!) 168/98 97.7 F (36.5 C) Oral (!) 134 (!) 22 99 % - -  10/13/2017 2306 - - - - - 98 % - -  10/22/2017 2300 (!) 160/92 - - (!) 138 20 98 % - -  11/02/2017 2121 (!) 142/94 98.3 F (36.8 C) Rectal (!) 124 (!) 21 98 % - -  10/31/2017 1909 - - - - - - _0  (1.753 m) 83.9 kg (185 lb)  10/13/2017 1859 (!) 136/100 98.2 F (36.8 C) Oral (!) 148 20 97 % - -  11/07/2017 1854 - - - - - 95 % - -    10:18 PM Reevaluation with update and discussion. After initial assessment and treatment, an updated evaluation reveals he feels fairly comfortable and is agreeable for admission.  Findings discussed and questions answered. Daleen Bo   Medical Decision Making: Patient with metastatic cancer, at last oncology visit decision made to discontinue aggressive treatment.  Expectant management is being undertaken at this time.  He has not yet seen hospice but he wants to do that.  He has had a palliative care consultation.  He wants to be made DNR.  Today his major complaint is leaking urine from his penis.  He has an inflammatory process of the pelvis which likely explains elevated white count  and lactate.  Doubt sepsis, significant metabolic instability or impending vascular collapse.  Will seek hospitalization for observation and initiation of hospice treatment and care plan.  CRITICAL CARE- yes Performed by: Daleen Bo   Nursing Notes Reviewed/ Care Coordinated Applicable Imaging Reviewed Interpretation of Laboratory Data incorporated into ED treatment   10:20 PM-Consult complete with hospitalist. Patient case explained and discussed.  He agrees to admit patient for further evaluation and treatment. Call ended at 10:35 PM  Plan: Admit    Final Clinical Impressions(s) / ED Diagnoses   Final diagnoses:  Metastatic cancer [facility])  DNR (do not resuscitate)    ED Discharge Orders    None       Daleen Bo, MD 12/09/2017 [phone]

## 2017-11-08 NOTE — ED Notes (Signed)
ED TO INPATIENT HANDOFF REPORT  Name/Age/Gender Ronald Wilson 42 y.o. male  Code Status    Code Status Orders  (From admission, onward)        Start     Ordered   10/19/2017 2253  Do not attempt resuscitation (DNR)  Continuous    Question Answer Comment  In the event of cardiac or respiratory ARREST Do not call a "code blue"   In the event of cardiac or respiratory ARREST Do not perform Intubation, CPR, defibrillation or ACLS   In the event of cardiac or respiratory ARREST Use medication by any route, position, wound care, and other measures to relive pain and suffering. May use oxygen, suction and manual treatment of airway obstruction as needed for comfort.      11/03/2017 2252    Code Status History    Date Active Date Inactive Code Status Order ID Comments User Context   10/18/2017 2218 10/11/2017 2252 DNR [id number]  Daleen Bo, MD ED   06/22/2017 2120 06/28/2017 1801 Full Code [id number]  Hosie Poisson, MD Inpatient   11/17/2016 2218 11/23/2016 1515 Full Code [id number]  Reubin Milan, MD Inpatient    Advance Directive Documentation     Most Recent Value  Type of Advance Directive  Healthcare Power of Attorney  Pre-existing out of facility DNR order (yellow form or pink MOST form)  -  "MOST" Form in Place?  -      Home/SNF/Other Home  Chief Complaint Abdominal pain, genital pain  Level of Care/Admitting Diagnosis ED Disposition    ED Disposition Condition [place]: [facility] [[id number]]  Level of Care: Med-Surg [16]  Diagnosis: Sepsis [facility]) [[id number]]  Admitting Physician: Rosemarie Beath [facility] [[id number]]  Attending Physician: [facility], MIR Kerlan [facility] [[id number]]  Estimated length of stay: past midnight tomorrow  Certification:: I certify this patient will need inpatient services for at least 2 midnights  PT Class (Do Not Modify): Inpatient [101]  PT Acc Code (Do Not Modify): Private [1]       Medical History Past  Medical History:  Diagnosis Date  . Hypertension   . Inguinal mass 11/17/2016   left  . Sickle cell trait ([place])   . squamous cell penile ca dx'd 11/2015    Allergies No Known Allergies  IV Location/Drains/Wounds Patient Lines/Drains/Airways Status   Active Line/Drains/Airways    Name:   Placement date:   Placement time:   Site:   Days:   Implanted Port 12/26/16 Right Chest   12/26/16    1441    Chest   317   Incision (Closed) 11/22/16 Groin Left   11/22/16    1026     351   Wound / Incision (Open or Dehisced) 10/08/17 Non-pressure wound;Other (Comment)    10/08/17    0200    -   31          Labs/Imaging Results for orders placed or performed during the hospital encounter of 10/30/2017 (from the past 48 hour(s))  Urinalysis, Routine w reflex microscopic     Status: Abnormal   Collection Time: 10/09/2017  7:45 PM  Result Value Ref Range   Color, Urine YELLOW YELLOW   APPearance CLOUDY (A) CLEAR   Specific Gravity, Urine 1.015 1.005 - 1.030   pH 5.0 5.0 - 8.0   Glucose, UA NEGATIVE NEGATIVE mg/dL   Hgb urine dipstick LARGE (A) NEGATIVE   Bilirubin Urine NEGATIVE NEGATIVE   Ketones, ur NEGATIVE NEGATIVE mg/dL  Protein, ur 30 (A) NEGATIVE mg/dL   Nitrite NEGATIVE NEGATIVE   Leukocytes, UA LARGE (A) NEGATIVE   RBC / HPF >50 (H) 0 - 5 RBC/hpf   WBC, UA >50 (H) 0 - 5 WBC/hpf   Bacteria, UA FEW (A) NONE SEEN   Squamous Epithelial / LPF 0-5 0 - 5   WBC Clumps PRESENT    Mucus PRESENT    Hyaline Casts, UA PRESENT    Non Squamous Epithelial 0-5 (A) NONE SEEN    Comment: Performed at [facility], [place] [address], [place], [place] [address]  Comprehensive metabolic panel     Status: Abnormal   Collection Time: 10/16/2017  8:20 PM  Result Value Ref Range   Sodium 136 135 - 145 mmol/L   Potassium 4.9 3.5 - 5.1 mmol/L   Chloride 97 (L) 98 - 111 mmol/L   CO2 29 22 - 32 mmol/L   Glucose, Bld 123 (H) 70 - 99 mg/dL   BUN 30 (H) 6 - 20 mg/dL   Creatinine, Ser 1.89 (H)  0.61 - 1.24 mg/dL   Calcium >15.0 (HH) 8.9 - 10.3 mg/dL    Comment: REPEATED TO VERIFY CRITICAL RESULT CALLED TO, READ BACK BY AND VERIFIED WITH: INMAN,J AT 2145 ON 10/18/2017 BY MOSLEY,J    Total Protein 7.6 6.5 - 8.1 g/dL   Albumin 2.8 (L) 3.5 - 5.0 g/dL   AST 16 15 - 41 U/L   ALT 11 0 - 44 U/L   Alkaline Phosphatase 161 (H) 38 - 126 U/L   Total Bilirubin 0.2 (L) 0.3 - 1.2 mg/dL   GFR calc non Af Amer 43 (L) >60 mL/min   GFR calc Af Amer 49 (L) >60 mL/min    Comment: (NOTE) The eGFR has been calculated using the CKD EPI equation. This calculation has not been validated in all clinical situations. eGFR's persistently <60 mL/min signify possible Chronic Kidney Disease.    Anion gap 10 5 - 15    Comment: Performed at [facility], [place] [address], [place], [place] [address]  CBC with Differential     Status: Abnormal   Collection Time: 10/10/2017  8:20 PM  Result Value Ref Range   WBC 51.6 (HH) 4.0 - 10.5 K/uL    Comment: CRITICAL RESULT CALLED TO, READ BACK BY AND VERIFIED WITH: Loma Sender RN 2144 11/07/2017 A NAVARRO    RBC 4.12 (L) 4.22 - 5.81 MIL/uL   Hemoglobin 9.3 (L) 13.0 - 17.0 g/dL   HCT 29.9 (L) 39.0 - 52.0 %   MCV 72.6 (L) 78.0 - 100.0 fL   MCH 22.6 (L) 26.0 - 34.0 pg   MCHC 31.1 30.0 - 36.0 g/dL   RDW 18.8 (H) 11.5 - 15.5 %   Platelets 361 150 - 400 K/uL   Neutrophils Relative % 94 %   Neutro Abs 48.8 (H) 1.7 - 7.7 K/uL   Lymphocytes Relative 2 %   Lymphs Abs 0.9 0.7 - 4.0 K/uL   Monocytes Relative 4 %   Monocytes Absolute 1.9 (H) 0.1 - 1.0 K/uL   Eosinophils Relative 0 %   Eosinophils Absolute 0.0 0.0 - 0.7 K/uL   Basophils Relative 0 %   Basophils Absolute 0.1 0.0 - 0.1 K/uL   WBC Morphology WHITE COUNT CONFIRMED ON SMEAR     Comment: Performed at [facility], [place] [address] [place], [place] [address]  Protime-INR     Status: None   Collection Time: 11/07/2017  8:20  PM  Result Value Ref Range   Prothrombin Time 13.0 11.4 -  15.2 seconds   INR 0.99     Comment: Performed at [facility], [place] [address], [place], [place] [address]  I-Stat CG4 Lactic Acid, ED     Status: Abnormal   Collection Time: 10/29/2017  8:33 PM  Result Value Ref Range   Lactic Acid, Venous 2.37 (HH) 0.5 - 1.9 mmol/L   Comment NOTIFIED PHYSICIAN    No results found.  Pending Labs Unresulted Labs (From admission, onward)   Start     Ordered   11/12/2017 2694  Basic metabolic panel  Tomorrow morning,   R     10/26/2017 2252   12/06/2017 0500  CBC  Tomorrow morning,   R     10/23/2017 2252   10/12/2017 1945  Culture, blood (Routine x 2)  BLOOD CULTURE X 2,   STAT     11/02/2017 1944   10/23/2017 1945  Urine culture  STAT,   STAT     11/04/2017 1944      Vitals/Pain Today's Vitals   10/24/2017 1904 10/20/2017 1909 10/25/2017 2121 10/24/2017 2301  BP:   (!) 142/94   Pulse:   (!) 124   Resp:   (!) 21   Temp:   98.3 F (36.8 C)   TempSrc:   Rectal   SpO2:   98%   Weight:  185 lb (83.9 kg)    Height:  5' 9"  (1.753 m)    PainSc: 8   7  6      Isolation Precautions No active isolations  Medications Medications  amLODipine (NORVASC) tablet 10 mg (has no administration in time range)  morphine (MS CONTIN) 12 hr tablet 30 mg (has no administration in time range)  prochlorperazine (COMPAZINE) tablet 10 mg (has no administration in time range)  enoxaparin (LOVENOX) injection 40 mg (has no administration in time range)  HYDROmorphone (DILAUDID) injection 1 mg (has no administration in time range)  acetaminophen (TYLENOL) tablet 650 mg (has no administration in time range)    Or  acetaminophen (TYLENOL) suppository 650 mg (has no administration in time range)  senna-docusate (Senokot-S) tablet 1 tablet (has no administration in time range)  ondansetron (ZOFRAN) tablet 4 mg (has no administration in time range)    Or  ondansetron (ZOFRAN) injection 4 mg (has no administration in time range)  oxyCODONE-acetaminophen (PERCOCET/ROXICET)  5-325 MG per tablet 1 tablet (has no administration in time range)    And  oxyCODONE (Oxy IR/ROXICODONE) immediate release tablet 5 mg (has no administration in time range)  sodium chloride 0.9 % bolus 3,000 mL (3,000 mLs Intravenous New Bag/Given 10/09/2017 2057)  oxyCODONE-acetaminophen (PERCOCET/ROXICET) 5-325 MG per tablet 1 tablet (1 tablet Oral Given 10/26/2017 2201)    Mobility Walks

## 2017-11-08 NOTE — ED Notes (Signed)
Date and time results received: 10/25/2017 21:45  Test: Calcium  Critical Value: Greater than 15 mg/dL  Name of Provider Notified: Dr. Eulis Foster notified via telephone and Wille Glaser, RN notified in person.   Orders Received? Or Actions Taken?: Will continue to monitor and await for new orders.

## 2017-11-08 NOTE — ED Notes (Signed)
Bed: WB91 Expected date:  Expected time:  Means of arrival:  Comments: Ems: cancer pt

## 2017-11-08 NOTE — ED Triage Notes (Signed)
Patient BIB [facility] from home for n/v, abdominal pain and groin pain x 2 days. Pt has penial cancer, states he hasn't had chemo in a few months. Pt c/o LLQ pain, reports vomiting x1 and hasn't been able to eat x2 days. Pt reports fever and chills. No BM in past 2 days but is able to urinate normally.

## 2017-11-08 NOTE — ED Notes (Signed)
Error message wi fi not working- manual pump bolus

## 2017-11-08 NOTE — ED Notes (Signed)
EDP Wentz notified of abnormal I stat lactic result.

## 2017-11-08 NOTE — H&P (Signed)
History and Physical  Ronald Wilson VEL:[id number] DOB: 1975-07-25 DOA: 10/17/2017   PCP: [name], FNP   Patient coming from: Home   Chief Complaint: Penile drainage and pain, shortness of breath   HPI: Ronald Wilson is a 42 y.o. 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  He was diagnosed in 2018 S/P cisplatin and 5-FU palliative chemotherapy.  He completed 3 cycles in November 2018 and developed progression of disease. He also had radiation therapy between April 05, 2017 and April 20, 2017.  He received 30 Gy in 10 fractions to the pelvis and the periaortic areas. He also developed left upper extremity deep vein thrombosis and completed a course of Xarelto.  On 10/19/2017 he had a follow up with Dr. Osker Mason his oncologist at which visit they decided to forego further therapy for the time being and consider Hospice. The patient has met with Palliative Care but has not decided on hospice yet.   He came to the hospital today since he has worsening penile pain and swelling. He had a subjective fever at home as well as some vomiting and lower abdominal pain.  He says his entire penis is now swollen more in the last 2 weeks, and he has some sores on the bottom of his penis though with urine and some pus is leaking.  ED Course: In the ER, he was given three liters of normal saline. Code status was addressed and decision was made by the patient to be DNR. Hospitalist was consulted and requested to consider hospital admission to help clarify goals of care and potentially set up hospice, as the patient lives alone with his elderly father.  Review of Systems: Please see HPI for pertinent positives and negatives. A complete 10 system review of systems are otherwise negative.  Past Medical History:  Diagnosis Date  . Hypertension   . Inguinal mass 11/17/2016   left  .  Sickle cell trait ([place])   . squamous cell penile ca dx'd 11/2015   Past Surgical History:  Procedure Laterality Date  . IR FLUORO GUIDE PORT INSERTION RIGHT  12/26/2016  . IR US GUIDE VASC ACCESS RIGHT  12/26/2016  . NO PAST SURGERIES      Social History:  reports that he has been smoking cigarettes.  He has a 6.25 pack-year smoking history. He has never used smokeless tobacco. He reports that he drinks about 9.0 oz of alcohol per week. He reports that he has current or past drug history. Drug: Marijuana.   No Known Allergies  Family History  Problem Relation Age of Onset  . Hypertension Mother   . Cancer Maternal Aunt        unknown     Prior to Admission medications   Medication Sig Start Date End Date Taking? Authorizing Provider  lidocaine-prilocaine (EMLA) cream Apply 1 application topically as needed. Apply to porta cath before chemotherapy. 12/16/16  Yes Wyatt Portela, MD  morphine (MS CONTIN) 30 MG 12 hr tablet Take 1 tablet (30 mg total) by mouth every 12 (twelve) hours. 10/19/17  Yes Wyatt Portela, MD  oxyCODONE-acetaminophen (PERCOCET) 10-325 MG tablet Take 1 tablet by mouth every 4 (four) hours as needed for pain. 11/03/17  Yes Wyatt Portela, MD  amLODipine (NORVASC) 10 MG tablet Take 1 tablet (10 mg total) by mouth daily. Patient not taking: Reported on 10/16/2017 07/13/17   Wyatt Portela, MD  naproxen sodium (ALEVE) 220 MG tablet Take 220 mg by mouth daily as needed (pain).    [provider]  prochlorperazine (COMPAZINE) 10 MG tablet Take 1 tablet (10 mg total) by mouth every 6 (six) hours as needed for nausea or vomiting. 07/13/17   Wyatt Portela, MD    Physical Exam: BP (!) 168/98 (BP Location: Left Arm)   Pulse (!) 134   Temp 97.7 F (36.5 C) (Oral)   Resp (!) 22   Ht _0  (1.753 m)   Wt 83.9 kg (185 lb)   SpO2 99%   BMI 27.32 kg/m   General:  Alert, oriented, calm, in no acute distress, looks well nourished and nontoxic Eyes: EOMI, clear  conjuctivae, white sclerea Neck: supple, no masses, trachea mildline  Cardiovascular: RRR, no murmurs or rubs, no peripheral edema  Respiratory: clear to auscultation bilaterally, no wheezes, no crackles  Abdomen: soft, nontender, nondistended, normal bowel tones heard  Skin: dry, no rashes  GU: penis is largely ulcerated with destruction of penile head and shaft, with several blisters and ulcers along the sore with pus draining. Associated coliform masses also present. Not very tender to palpation, no obvious areas of necrosis. No significant scrotal edema. Musculoskeletal: no joint effusions, normal range of motion  Psychiatric: appropriate affect, normal speech  Neurologic: extraocular muscles intact, clear speech, moving all extremities with intact sensorium            Labs on Admission:  Basic Metabolic Panel: Recent Labs  Lab 10/11/2017 2020  NA 136  K 4.9  CL 97*  CO2 29  GLUCOSE 123*  BUN 30*  CREATININE 1.89*  CALCIUM >15.0*   Liver Function Tests: Recent Labs  Lab 10/28/2017 2020  AST 16  ALT 11  ALKPHOS 161*  BILITOT 0.2*  PROT 7.6  ALBUMIN 2.8*   No results for input(s): LIPASE, AMYLASE in the last 168 hours. No results for input(s): AMMONIA in the last 168 hours. CBC: Recent Labs  Lab 10/13/2017 2020  WBC 51.6*  NEUTROABS 48.8*  HGB 9.3*  HCT 29.9*  MCV 72.6*  PLT 361   Cardiac Enzymes: No results for input(s): CKTOTAL, CKMB, CKMBINDEX, TROPONINI in the last 168 hours.  BNP (last 3 results) Recent Labs    11/17/16 1247  BNP 32.1    ProBNP (last 3 results) No results for input(s): PROBNP in the last 8760 hours.  CBG: No results for input(s): GLUCAP in the last 168 hours.  Radiological Exams on Admission: Dg Chest 2 View  Result Date: 10/23/2017 CLINICAL DATA:  Sepsis, fever, cancer patient EXAM: CHEST - 2 VIEW COMPARISON:  CT chest dated 10/17/2017 FINDINGS: At least 3 left upper lobe pulmonary nodules, better evaluated on recent CT.  Additional pulmonary metastases are not radiographically evident. No focal consolidation.  No pleural effusion or pneumothorax. Heart is normal in size. Right chest power port terminating at the cavoatrial junction. IMPRESSION: At least 3 left upper lobe pulmonary nodules, with additional pulmonary metastases better evaluated on recent CT. No evidence of acute cardiopulmonary disease. Electronically Signed   By: Julian Hy M.D.   On: 10/18/2017 23:40   Assessment/Plan Present on Admission: . Sepsis ([place]) . Inguinal mass . Ulcer of penis . Anemia . Penile cancer ([place]) . Wound, open . Lactic acidosis  This is a very unfortunate 42 year old African American male with a history of metastatic penile squamous cell cancer that has unfortunately progressed despite chemotherapy and radiation. He is being admitted with  sepsis related to infection of his penile lesions and concern for abscess.   Sepsis ([place]) - meeting criteria with leukocytosis, tachycardia, subjective fever, lactic acidosis and infected penile ulcers as source. - inpatient admission - IV fluid boluses x3 have been given in ER - empiric IV Zosyn - blood and urine cultures - trend lactate shows improvement already - recommend Urology consultation in AM - recommend Oncology notification of admission in AM - consider Hospice referral  Inguinal mass Ulcer of penis Anemia - chronic, stable Penile cancer ([place]) Wound, open Port-A-Cath in place Leukocytosis Lactic acidosis  DVT prophylaxis: Lovenox   Code Status: DNR   Family Communication: None, he has not told most of his family including his father and his seven kids about his diagnosis. I encouraged him to discuss his diagnosis with them, and he says that after tonight he is convinced he needs to let them know.   Disposition Plan: Likely discharge to home, potentially with Hospice.   Consults called: None   Admission status: Inpatient   Time spent: 48  minutes  Mir Marry Guan MD [facility] Pager [phone]  If 7PM-7AM, please contact night-coverage [email or web address] Password [facility]  10/21/2017, 11:53 PM

## 2017-11-09 ENCOUNTER — Other Ambulatory Visit: Payer: Self-pay

## 2017-11-09 DIAGNOSIS — A419 Sepsis, unspecified organism: Principal | ICD-10-CM

## 2017-11-09 LAB — BASIC METABOLIC PANEL
Anion gap: 6 (ref 5–15)
BUN: 28 mg/dL — AB (ref 6–20)
CHLORIDE: 101 mmol/L (ref 98–111)
CO2: 28 mmol/L (ref 22–32)
Calcium: >15 mg/dL (ref 8.9–10.3)
Creatinine, Ser: 1.74 mg/dL — ABNORMAL HIGH (ref 0.61–1.24)
GFR calc Af Amer: 54 mL/min — ABNORMAL LOW (ref >60)
GFR calc non Af Amer: 47 mL/min — ABNORMAL LOW (ref >60)
GLUCOSE: 118 mg/dL — AB (ref 70–99)
POTASSIUM: 4.6 mmol/L (ref 3.5–5.1)
Sodium: 135 mmol/L (ref 135–145)

## 2017-11-09 LAB — CBC
HEMATOCRIT: 26.8 % — AB (ref 39.0–52.0)
Hemoglobin: 8.7 g/dL — ABNORMAL LOW (ref 13.0–17.0)
MCH: 23.2 pg — ABNORMAL LOW (ref 26.0–34.0)
MCHC: 32.5 g/dL (ref 30.0–36.0)
MCV: 71.5 fL — ABNORMAL LOW (ref 78.0–100.0)
Platelets: 469 10*3/uL — ABNORMAL HIGH (ref 150–400)
RBC: 3.75 MIL/uL — ABNORMAL LOW (ref 4.22–5.81)
RDW: 18.8 % — ABNORMAL HIGH (ref 11.5–15.5)
WBC: 61.3 10*3/uL — AB (ref 4.0–10.5)

## 2017-11-09 LAB — MRSA PCR SCREENING: MRSA by PCR: NEGATIVE

## 2017-11-09 MED ORDER — SODIUM CHLORIDE 0.9% FLUSH
10.0000 mL | INTRAVENOUS | Status: DC | PRN
  Administered 2017-11-10: 40 mL
  Filled 2017-11-09: qty 40

## 2017-11-09 MED ORDER — VANCOMYCIN HCL IN DEXTROSE 1-5 GM/200ML-% IV SOLN
1000.0000 mg | Freq: Once | INTRAVENOUS | Status: AC
  Administered 2017-11-09: 1000 mg via INTRAVENOUS
  Filled 2017-11-09: qty 200

## 2017-11-09 MED ORDER — VANCOMYCIN HCL 10 G IV SOLR
1250.0000 mg | INTRAVENOUS | Status: DC
  Administered 2017-11-09 – 2017-11-12 (×4): 1250 mg via INTRAVENOUS
  Filled 2017-11-09 (×4): qty 1250

## 2017-11-09 MED ORDER — CALCITONIN (SALMON) 200 UNIT/ML IJ SOLN
4.0000 [IU]/kg | Freq: Two times a day (BID) | INTRAMUSCULAR | Status: DC
  Administered 2017-11-09 – 2017-11-10 (×3): 336 [IU] via INTRAMUSCULAR
  Filled 2017-11-09 (×4): qty 1.68

## 2017-11-09 MED ORDER — SODIUM CHLORIDE 0.9 % IV SOLN
INTRAVENOUS | Status: DC
  Administered 2017-11-09 – 2017-11-12 (×5): via INTRAVENOUS

## 2017-11-09 MED ORDER — ZOLEDRONIC ACID 4 MG/5ML IV CONC
4.0000 mg | Freq: Once | INTRAVENOUS | Status: AC
  Administered 2017-11-09: 4 mg via INTRAVENOUS
  Filled 2017-11-09: qty 5

## 2017-11-09 NOTE — Consult Note (Signed)
[facility] Nurse wound consult note Reason for Consult: penile cancer wound and right groin draining wounds  Wound type: swollen large fungating type tumor with necrosis, left  groin with some full thickness skin loss and hardened nodules that are draining Pressure Injury POA: NA Measurement:left groin 0.4cm x 1.0cm lesion that I can see; 100% pink pale tissue Wound bed:see above  Drainage (amount, consistency, odor) not able to assess the penile wound, no real dressing in place. Moderate yellow drainage from the right groin wound with odor. Odor also most like to necrotic tissue of the penis  Periwound: edema  Dressing procedure/placement/frequency: Xeroform gauze to cover penis, silver hydrofiber to the left groin. Change every other day. Can use ABD pads to cover and mesh underwear if needed.  Plans to transition to residential hospice per bedside nurse.   Discussed POC with patient and bedside nurse.  Re consult if needed, will not follow at this time. Thanks  Ariyona Eid [employer], RN,CWOCN, CNS, [place] [phone])

## 2017-11-09 NOTE — Progress Notes (Signed)
Pharmacy Antibiotic Note  Ronald Wilson is a 42 y.o. male admitted on 10/09/2017 with penile drainage, SOB.  Pharmacy has been consulted for vancomycin dosing.  Patient has PMH significant for squamous cell carcinoma of the penis diagnosed in 2018 presenting with penile pain/swelling. Per H&P, some sores with pus. Vancomycin + piperacillin/tazobactam being started for sepsis.   Plan:  Vancomycin 1000 mg IV once followed by vancomycin 1250 mg IV q24h (stagger dose to begin 12 hours after initial dose)  Goal AUC 400-500  Follow renal function and cultures  Follow vancomycin levels once at steady state  Height: 5\' 9"  (175.3 cm) Weight: 185 lb (83.9 kg) IBW/kg (Calculated) : 70.7  Temp (24hrs), Avg:98.1 F (36.7 C), Min:97.7 F (36.5 C), Max:98.3 F (36.8 C)  Recent Labs  Lab 10/21/2017 2020 10/18/2017 2033 11/02/2017 2307 12/05/2017 0453  WBC 51.6*  --   --  61.3*  CREATININE 1.89*  --   --  1.74*  LATICACIDVEN  --  2.37* 1.65  --     Estimated Creatinine Clearance: 55.9 mL/min (A) (by C-G formula based on SCr of 1.74 mg/dL (H)).    No Known Allergies  Antimicrobials this admission: Piperacillin/tazobactam 8/1 >>  vancomycin 8/1 >>   Dose adjustments this admission:  Microbiology results: 7/31 BCx: Sent 7/31 UCx: Sent  8/1 MRSA PCR: Negative  Thank you for allowing pharmacy to be a part of this patient's care.  Lenis Noon, PharmD, BCPS Clinical Pharmacist 11/30/2017 12:22 PM

## 2017-11-09 NOTE — Progress Notes (Signed)
PROGRESS NOTE    Ronald Wilson  DTO:[id number] DOB: Aug 03, 1975 DOA: 10/16/2017 PCP: [name], FNP   Brief Narrative: Patient is a 42 year old male with past medical history of metastatic squamous cell carcinoma of the penis, metastatic pulmonary nodules and diffuse lymphadenopathy who presented to the emergency department for evaluation of worsening panel pain and swelling, fever, vomiting and lower abdominal pain.  Patient currently being managed for possible sepsis secondary to penile lesions.  Assessment & Plan:   Principal Problem:   Sepsis ([place]) Active Problems:   Inguinal mass   Ulcer of penis   Anemia   Penile cancer ([facility])   Wound, open   Port-A-Cath in place   Leukocytosis   Lactic acidosis   Hypercalcemia of malignancy  Sepsis: Presented with leukocytosis, tachycardia, fever, lactic acidosis.  Most likely source is  infected penile ulcers.  Continue IV fluids.  We will follow-up blood cultures.  Continue broad-spectrum antibiotics.  Metastatic squamous cell carcinoma of the penis:  Metastatic disease including pulmonary nodules and diffuse lymphadenopathy. He was diagnosed in 2018. S/P cisplatin and 5-FU palliative chemotherapy. He completed 3 cycles in November 2018 and developed progression of disease. He also had radiation therapy between April 05, 2017 and April 20, 2017. He received 30 Gy in 10 fractions to the pelvis and the periaortic areas.  On 10/19/2017 he had a follow up with Dr. Osker Mason his oncologist at which visit they decided to forego further therapy for the time being and consider Hospice. The patient has met with Palliative Care but has not decided on hospice yet.  Currently patient expresses desire to be transferred to residential hospice.  He says hospice at home is not possible because of his elderly father who cannot take care of him. I have requested for palliative care consultation.  I have also consulted social worker for contacting  residential hospice.  Penile ulcers/metastatic lesions/penile swelling: I have requested for urology to evaluate him.  I will also request for wound care evaluation.  Hypercalcemia of malignancy: We will continue IV fluids.  Started on calcitonin.  Will give a dose of Zometa.  We will follow the calcium level tomorrow.  Severe leukocytosis: Most likely history of malignancy and sepsis.  Continue antibiotics.  We will continue monitor the WBC trend. Follow-up cultures.  Acute kidney injury: Likely prerenal.  We will continue IV fluids.  We will continue to monitor kidney function.  Anemia: Most likely acid with malignancy.  We will continue to monitor his CBC   DVT prophylaxis: Lovenox Code Status: DNR Family Communication: None present at the bedside Disposition Plan: Hospice  Consultants: Urology, palliative care  Procedures: None  Antimicrobials: Vancomycin and Zosyn  Subjective: Patient seen and examined the bedside this morning.  Remains tachycardic.  Feels weak and complains of pain on the penile area.  Objective: Vitals:   10/10/2017 2300 10/18/2017 2306 10/25/2017 2351 11/25/2017 0548  BP: (!) 160/92  (!) 168/98 134/84  Pulse: (!) 138  (!) 134 (!) 128  Resp: 20  (!) 22 18  Temp:   97.7 F (36.5 C) 98 F (36.7 C)  TempSrc:   Oral Oral  SpO2: 98% 98% 99% 95%  Weight:      Height:        Intake/Output Summary (Last 24 hours) at 11/11/2017 1350 Last data filed at 12/04/2017 0600 Gross per 24 hour  Intake 276.09 ml  Output -  Net 276.09 ml   Filed Weights   10/21/2017 1909  Weight: 83.9  kg (185 lb)    Examination:  General exam: Not in distress,average built,chronically ill HEENT:PERRL,Oral mucosa moist, Ear/Nose normal on gross exam Respiratory system: Bilateral equal air entry, normal vesicular breath sounds, no wheezes or crackles  Cardiovascular system: Sinus tachycardia, No JVD, murmurs, rubs, gallops or clicks. No pedal edema. Gastrointestinal system: Abdomen is  nondistended, soft and nontender. No organomegaly or masses felt. Normal bowel sounds heard. Central nervous system: Alert and oriented. No focal neurological deficits. Extremities: No edema, no clubbing ,no cyanosis, distal peripheral pulses palpable. Skin: No rashes, lesions or ulcers,no icterus ,no pallor MSK: Normal muscle bulk,tone ,power Psychiatry: Judgement and insight appear normal. Mood & affect appropriate.  GU: Multiple penile ulcers, abscesses, nodules, bilateral inguinal lymphadenopathy with ulcers,foul smell    Data Reviewed: I have personally reviewed following labs and imaging studies  CBC: Recent Labs  Lab 10/18/2017 2020 11/27/2017 0453  WBC 51.6* 61.3*  NEUTROABS 48.8*  --   HGB 9.3* 8.7*  HCT 29.9* 26.8*  MCV 72.6* 71.5*  PLT 361 989*   Basic Metabolic Panel: Recent Labs  Lab 10/22/2017 2020 11/13/2017 0453  NA 136 135  K 4.9 4.6  CL 97* 101  CO2 29 28  GLUCOSE 123* 118*  BUN 30* 28*  CREATININE 1.89* 1.74*  CALCIUM >15.0* >15.0*   GFR: Estimated Creatinine Clearance: 55.9 mL/min (A) (by C-G formula based on SCr of 1.74 mg/dL (H)). Liver Function Tests: Recent Labs  Lab 10/17/2017 2020  AST 16  ALT 11  ALKPHOS 161*  BILITOT 0.2*  PROT 7.6  ALBUMIN 2.8*   No results for input(s): LIPASE, AMYLASE in the last 168 hours. No results for input(s): AMMONIA in the last 168 hours. Coagulation Profile: Recent Labs  Lab 10/27/2017 2020  INR 0.99   Cardiac Enzymes: No results for input(s): CKTOTAL, CKMB, CKMBINDEX, TROPONINI in the last 168 hours. BNP (last 3 results) No results for input(s): PROBNP in the last 8760 hours. HbA1C: No results for input(s): HGBA1C in the last 72 hours. CBG: No results for input(s): GLUCAP in the last 168 hours. Lipid Profile: No results for input(s): CHOL, HDL, LDLCALC, TRIG, CHOLHDL, LDLDIRECT in the last 72 hours. Thyroid Function Tests: No results for input(s): TSH, T4TOTAL, FREET4, T3FREE, THYROIDAB in the last 72  hours. Anemia Panel: No results for input(s): VITAMINB12, FOLATE, FERRITIN, TIBC, IRON, RETICCTPCT in the last 72 hours. Sepsis Labs: Recent Labs  Lab 10/31/2017 2033 11/06/2017 2307  LATICACIDVEN 2.37* 1.65    Recent Results (from the past 240 hour(s))  MRSA PCR Screening     Status: None   Collection Time: 11/13/2017 10:22 AM  Result Value Ref Range Status   MRSA by PCR NEGATIVE NEGATIVE Final    Comment:        The GeneXpert MRSA Assay (FDA approved for NASAL specimens only), is one component of a comprehensive MRSA colonization surveillance program. It is not intended to diagnose MRSA infection nor to guide or monitor treatment for MRSA infections. Performed at [facility], [place] [address] [place], [place] [address]          Radiology Studies: Dg Chest 2 View  Result Date: 10/14/2017 CLINICAL DATA:  Sepsis, fever, cancer patient EXAM: CHEST - 2 VIEW COMPARISON:  CT chest dated 10/17/2017 FINDINGS: At least 3 left upper lobe pulmonary nodules, better evaluated on recent CT. Additional pulmonary metastases are not radiographically evident. No focal consolidation.  No pleural effusion or pneumothorax. Heart is normal in size. Right chest power port  terminating at the cavoatrial junction. IMPRESSION: At least 3 left upper lobe pulmonary nodules, with additional pulmonary metastases better evaluated on recent CT. No evidence of acute cardiopulmonary disease. Electronically Signed   By: Julian Hy M.D.   On: 11/03/2017 23:40        Scheduled Meds: . amLODipine  10 mg Oral Daily  . calcitonin  4 Units/kg Intramuscular BID  . enoxaparin (LOVENOX) injection  40 mg Subcutaneous QHS  . morphine  30 mg Oral Q12H   Continuous Infusions: . sodium chloride    . piperacillin-tazobactam (ZOSYN)  IV 3.375 g (11/16/2017 1337)  . vancomycin    . zoledronic acid (ZOMETA) IV       LOS: 1 day    Time spent: 35  mins.More than 50% of that time was spent in  counseling and/or coordination of care.      Shelly Coss, MD [facility] Pager [phone]  If 7PM-7AM, please contact night-coverage [email or web address] Password [email or web address] 11/17/2017, 1:50 PM

## 2017-11-09 DEATH — deceased

## 2017-11-10 DIAGNOSIS — Z515 Encounter for palliative care: Secondary | ICD-10-CM

## 2017-11-10 DIAGNOSIS — R1909 Other intra-abdominal and pelvic swelling, mass and lump: Secondary | ICD-10-CM

## 2017-11-10 DIAGNOSIS — Z7189 Other specified counseling: Secondary | ICD-10-CM

## 2017-11-10 LAB — BASIC METABOLIC PANEL
Anion gap: 8 (ref 5–15)
BUN: 25 mg/dL — AB (ref 6–20)
CO2: 27 mmol/L (ref 22–32)
Calcium: 13 mg/dL — ABNORMAL HIGH (ref 8.9–10.3)
Chloride: 102 mmol/L (ref 98–111)
Creatinine, Ser: 1.63 mg/dL — ABNORMAL HIGH (ref 0.61–1.24)
GFR, EST AFRICAN AMERICAN: 59 mL/min — AB (ref >60)
GFR, EST NON AFRICAN AMERICAN: 51 mL/min — AB (ref >60)
Glucose, Bld: 96 mg/dL (ref 70–99)
Potassium: 4.7 mmol/L (ref 3.5–5.1)
Sodium: 137 mmol/L (ref 135–145)

## 2017-11-10 LAB — CBC WITH DIFFERENTIAL/PLATELET
BASOS PCT: 0 %
Basophils Absolute: 0 10*3/uL (ref 0.0–0.1)
EOS ABS: 0 10*3/uL (ref 0.0–0.7)
Eosinophils Relative: 0 %
HCT: 27.4 % — ABNORMAL LOW (ref 39.0–52.0)
Hemoglobin: 9 g/dL — ABNORMAL LOW (ref 13.0–17.0)
LYMPHS ABS: 0.8 10*3/uL (ref 0.7–4.0)
Lymphocytes Relative: 1 %
MCH: 23.6 pg — ABNORMAL LOW (ref 26.0–34.0)
MCHC: 32.8 g/dL (ref 30.0–36.0)
MCV: 71.9 fL — ABNORMAL LOW (ref 78.0–100.0)
Monocytes Absolute: 0.8 10*3/uL (ref 0.1–1.0)
Monocytes Relative: 1 %
NEUTROS ABS: 74.7 10*3/uL — AB (ref 1.7–7.7)
Neutrophils Relative %: 98 %
Platelets: 395 10*3/uL (ref 150–400)
RBC: 3.81 MIL/uL — ABNORMAL LOW (ref 4.22–5.81)
RDW: 18.9 % — ABNORMAL HIGH (ref 11.5–15.5)
WBC: 76.3 10*3/uL (ref 4.0–10.5)

## 2017-11-10 NOTE — Consult Note (Signed)
Consultation Note Date: 11/10/2017   Patient Name: Ronald Wilson  DOB: 04-29-1975  MRN: [id number]  Age / Sex: 42 y.o., male  PCP: Ronald Dew, FNP Referring Physician: Shelly Coss, MD  Reason for Consultation: Establishing goals of care  HPI/Patient Profile: 42 y.o. 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  He is not a candidate for further disease modifying therapy and has been asking about hospice support.  Palliative consulted for goals of care.   Clinical Assessment and Goals of Care: I met today with Ronald Wilson.   We discussed clinical course as well as wishes moving forward in regard to advanced directives.  Concepts specific to code status and caree plan this hospitalization discussed.  We discussed difference between a aggressive medical intervention path and a palliative, comfort focused care path.  Values and goals of care important to patient and family were attempted to be elicited.  Concept of Hospice and Palliative Care were discussed  Questions and concerns addressed.   PMT will continue to support holistically.  SUMMARY OF RECOMMENDATIONS   - Ronald Wilson reports that he wants to transition to residential hospice, but also seemed surprised to hear that they would not continue antibiotics and his prognosis in that scenario is likely days to weeks.  He still has not told family about his condition.   - Reports that he will speak with his father this evening.  Plan for follow-up tomorrow.  Hospice referral has already been placed to begin to engage with him.  Code Status/Advance Care Planning:  DNR   Symptom Management:   Reports symptoms well controlled including adequate pain management.  Psycho-social/Spiritual:   Desire for further Chaplaincy support:Did not address today.  Additional  Recommendations: Education on Hospice  Prognosis:   guarded  Discharge Planning: To Be Determined- ? Residential hospice      Primary Diagnoses: Present on Admission: . Sepsis ([place]) . Inguinal mass . Ulcer of penis . Anemia . Penile cancer ([place]) . Wound, open . Lactic acidosis   I have reviewed the medical record, interviewed the patient and family, and examined the patient. The following aspects are pertinent.  Past Medical History:  Diagnosis Date  . Hypertension   . Inguinal mass 11/17/2016   left  . Sickle cell trait ([place])   . squamous cell penile ca dx'd 11/2015   Social History   Socioeconomic History  . Marital status: Single    Spouse name: Not on file  . Number of children: Not on file  . Years of education: Not on file  . Highest education level: Not on file  Occupational History  . Not on file  Social Needs  . Financial resource strain: Not on file  . Food insecurity:    Worry: Not on file    Inability: Not on file  . Transportation needs:    Medical: Not on file    Non-medical: Not on file  Tobacco Use  . Smoking  status: Current Every Day Smoker    Packs/day: 0.25    Years: 25.00    Pack years: 6.25    Types: Cigarettes  . Smokeless tobacco: Never Used  Substance and Sexual Activity  . Alcohol use: Yes    Alcohol/week: 9.0 oz    Types: 15 Shots of liquor per week    Comment: "11/17/2016 "I'll have shots ~ 3 days/wk"  . Drug use: Yes    Types: Marijuana    Comment: 11/17/2016 "daily"  . Sexual activity: Yes    Birth control/protection: Condom  Lifestyle  . Physical activity:    Days per week: Not on file    Minutes per session: Not on file  . Stress: Not on file  Relationships  . Social connections:    Talks on phone: Not on file    Gets together: Not on file    Attends religious service: Not on file    Active member of club or organization: Not on file    Attends meetings of clubs or organizations: Not on file    Relationship  status: Not on file  Other Topics Concern  . Not on file  Social History Narrative   Resides in [place]. 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 Reports he and his father live together.    Family History  Problem Relation Age of Onset  . Hypertension Mother   . Cancer Maternal Aunt        unknown   Scheduled Meds: . amLODipine  10 mg Oral Daily  . calcitonin  4 Units/kg Intramuscular BID  . enoxaparin (LOVENOX) injection  40 mg Subcutaneous QHS  . morphine  30 mg Oral Q12H   Continuous Infusions: . sodium chloride 150 mL/hr at 11/10/17 0520  . piperacillin-tazobactam (ZOSYN)  IV 3.375 g (11/10/17 0519)  . vancomycin 1,250 mg (11/18/2017 2127)   PRN Meds:.acetaminophen **OR** acetaminophen, HYDROmorphone (DILAUDID) injection, ondansetron **OR** ondansetron (ZOFRAN) IV, oxyCODONE-acetaminophen **AND** oxyCODONE, prochlorperazine, senna-docusate, sodium chloride flush Medications Prior to Admission:  Prior to Admission medications   Medication Sig Start Date End Date Taking? Authorizing Provider  lidocaine-prilocaine (EMLA) cream Apply 1 application topically as needed. Apply to porta cath before chemotherapy. 12/16/16  Yes Ronald Portela, MD  morphine (MS CONTIN) 30 MG 12 hr tablet Take 1 tablet (30 mg total) by mouth every 12 (twelve) hours. 10/19/17  Yes Ronald Portela, MD  oxyCODONE-acetaminophen (PERCOCET) 10-325 MG tablet Take 1 tablet by mouth every 4 (four) hours as needed for pain. 11/03/17  Yes Ronald Portela, MD  amLODipine (NORVASC) 10 MG tablet Take 1 tablet (10 mg total) by mouth daily. Patient not taking: Reported on 10/22/2017 07/13/17   Ronald Portela, MD  naproxen sodium (ALEVE) 220 MG tablet Take 220 mg by mouth daily as needed (pain).    [provider]  prochlorperazine (COMPAZINE) 10 MG tablet Take 1 tablet (10 mg total) by mouth every 6 (six) hours as needed for nausea or vomiting. 07/13/17   Ronald Portela, MD   No Known Allergies Review of Systems  Reports  tired but denies other complaints.  Pain well controlled  Physical Exam General: Alert, awake, in no acute distress but appears tired and drifts off during conversation.  HEENT: No bruits, no goiter, no JVD Heart: Tachycardic. No murmur appreciated. Lungs: Good air movement, clear Abdomen: Soft, nontender, nondistended, positive bowel sounds.  Skin: Warm and dry Neuro: Grossly intact, nonfocal.   Vital Signs: BP 132/74 (BP Location: Left Arm)  Pulse (!) 139   Temp 98.8 F (37.1 C) (Oral)   Resp 18   Ht 5' 9" (1.753 m)   Wt 83.9 kg (185 lb)   SpO2 100%   BMI 27.32 kg/m  Pain Scale: 0-10   Pain Score: Asleep   SpO2: SpO2: 100 % O2 Device:SpO2: 100 % O2 Flow Rate: .   IO: Intake/output summary:   Intake/Output Summary (Last 24 hours) at 11/10/2017 0926 Last data filed at 11/10/2017 0600 Gross per 24 hour  Intake 3333.11 ml  Output -  Net 3333.11 ml    LBM: Last BM Date: 11/02/17 Baseline Weight: Weight: 83.9 kg (185 lb) Most recent weight: Weight: 83.9 kg (185 lb)     Palliative Assessment/Data:    Time Total: 60 minutes Greater than 50%  of this time was spent counseling and coordinating care related to the above assessment and plan.  Signed by: Micheline Rough, MD   Please contact Palliative Medicine Team phone at [phone] for questions and concerns.  For individual provider: See Shea Evans

## 2017-11-10 NOTE — Consult Note (Signed)
Urology Consult  Referring physician: Dr. Tawanna Wilson  Reason for referral: penile cancer  Chief Complaint: pelvic pain  History of Present Illness: [name] is a 42yo with a hx of metastatic penile cancer who presented to the ER with worsening pelvic pain and drainage from penile wound/inguinal wounds. He notes the drainage has been worse from his penis and left inguinal wounds for the past 2-3 weeks. He denies any worsening lower urinary tract symptoms. He denies hematuria. He has seen Dr. Alen Wilson and is not pursuing further therapy for his penile cancer.  Calcium is 13. Creatinine 1.6 WBC is 76.3  Past Medical History:  Diagnosis Date  . Hypertension   . Inguinal mass 11/17/2016   left  . Sickle cell trait ([place])   . squamous cell penile ca dx'd 11/2015   Past Surgical History:  Procedure Laterality Date  . IR FLUORO GUIDE PORT INSERTION RIGHT  12/26/2016  . IR US GUIDE VASC ACCESS RIGHT  12/26/2016  . NO PAST SURGERIES      Medications: I have reviewed the patient's current medications. Allergies: No Known Allergies  Family History  Problem Relation Age of Onset  . Hypertension Mother   . Cancer Maternal Aunt        unknown   Social History:  reports that he has been smoking cigarettes.  He has a 6.25 pack-year smoking history. He has never used smokeless tobacco. He reports that he drinks about 9.0 oz of alcohol per week. He reports that he has current or past drug history. Drug: Marijuana.  Review of Systems  Constitutional: Positive for chills, fever, malaise/fatigue and weight loss.  All other systems reviewed and are negative.   Physical Exam:  Vital signs in last 24 hours: Temp:  [97.3 F (36.3 C)-99 F (37.2 C)] 98.8 F (37.1 C) (08/02 0427) Pulse Rate:  [134-139] 139 (08/02 0427) Resp:  [18] 18 (08/02 0427) BP: (127-140)/(74-93) 132/74 (08/02 0427) SpO2:  [96 %-100 %] 100 % (08/02 0427) Physical Exam  Constitutional: He is oriented to person, place, and time.  He appears well-developed and well-nourished.  HENT:  Head: Normocephalic and atraumatic.  Eyes: Pupils are equal, round, and reactive to light. EOM are normal.  Neck: Normal range of motion. No thyromegaly present.  Cardiovascular: Normal rate and regular rhythm.  Respiratory: Effort normal. No respiratory distress.  GI: Soft. He exhibits no distension.  Genitourinary: Testes normal.  Genitourinary Comments: Fungating penile lesion involving glans, penile shaft. Bilateral inguinal lymphadenopathy with ulceration and draining sinuses  Musculoskeletal: Normal range of motion. He exhibits edema.  Neurological: He is alert and oriented to person, place, and time.  Skin: Skin is warm and dry.  Psychiatric: He has a normal mood and affect. His behavior is normal. Judgment and thought content normal.    Laboratory Data:  Results for orders placed or performed during the hospital encounter of 11/04/2017 (from the past 72 hour(s))  Urinalysis, Routine w reflex microscopic     Status: Abnormal   Collection Time: 11/07/2017  7:45 PM  Result Value Ref Range   Color, Urine YELLOW YELLOW   APPearance CLOUDY (A) CLEAR   Specific Gravity, Urine 1.015 1.005 - 1.030   pH 5.0 5.0 - 8.0   Glucose, UA NEGATIVE NEGATIVE mg/dL   Hgb urine dipstick LARGE (A) NEGATIVE   Bilirubin Urine NEGATIVE NEGATIVE   Ketones, ur NEGATIVE NEGATIVE mg/dL   Protein, ur 30 (A) NEGATIVE mg/dL   Nitrite NEGATIVE NEGATIVE   Leukocytes, UA LARGE (A)  NEGATIVE   RBC / HPF >50 (H) 0 - 5 RBC/hpf   WBC, UA >50 (H) 0 - 5 WBC/hpf   Bacteria, UA FEW (A) NONE SEEN   Squamous Epithelial / LPF 0-5 0 - 5   WBC Clumps PRESENT    Mucus PRESENT    Hyaline Casts, UA PRESENT    Non Squamous Epithelial 0-5 (A) NONE SEEN    Comment: Performed at [facility], [place] [address] [place], [place] [address]  Urine culture     Status: None (Preliminary result)   Collection Time: 10/09/2017  7:45 PM  Result Value Ref Range    Specimen Description      URINE, CLEAN CATCH Performed at [facility], [place] [address], [place], [place] [address]    Special Requests      NONE Performed at [facility], [place] [address], [place], [place] [address]    Culture      CULTURE REINCUBATED FOR BETTER GROWTH Performed at [facility], [place] [address] [place], [place] [address]    Report Status PENDING   Comprehensive metabolic panel     Status: Abnormal   Collection Time: 10/15/2017  8:20 PM  Result Value Ref Range   Sodium 136 135 - 145 mmol/L   Potassium 4.9 3.5 - 5.1 mmol/L   Chloride 97 (L) 98 - 111 mmol/L   CO2 29 22 - 32 mmol/L   Glucose, Bld 123 (H) 70 - 99 mg/dL   BUN 30 (H) 6 - 20 mg/dL   Creatinine, Ser 1.89 (H) 0.61 - 1.24 mg/dL   Calcium >15.0 (HH) 8.9 - 10.3 mg/dL    Comment: REPEATED TO VERIFY CRITICAL RESULT CALLED TO, READ BACK BY AND VERIFIED WITH: INMAN,J AT 2145 ON 11/07/2017 BY MOSLEY,J    Total Protein 7.6 6.5 - 8.1 g/dL   Albumin 2.8 (L) 3.5 - 5.0 g/dL   AST 16 15 - 41 U/L   ALT 11 0 - 44 U/L   Alkaline Phosphatase 161 (H) 38 - 126 U/L   Total Bilirubin 0.2 (L) 0.3 - 1.2 mg/dL   GFR calc non Af Amer 43 (L) >60 mL/min   GFR calc Af Amer 49 (L) >60 mL/min    Comment: (NOTE) The eGFR has been calculated using the CKD EPI equation. This calculation has not been validated in all clinical situations. eGFR's persistently <60 mL/min signify possible Chronic Kidney Disease.    Anion gap 10 5 - 15    Comment: Performed at [facility], [place] [address] [place],  [address]  CBC with Differential     Status: Abnormal   Collection Time: 11/03/2017  8:20 PM  Result Value Ref Range   WBC 51.6 (HH) 4.0 - 10.5 K/uL    Comment: CRITICAL RESULT CALLED TO, READ BACK BY AND VERIFIED WITH: Loma Sender RN 3419 10/28/2017 A NAVARRO    RBC 4.12 (L) 4.22 - 5.81 MIL/uL   Hemoglobin 9.3 (L) 13.0 - 17.0 g/dL   HCT 29.9 (L) 39.0 - 52.0 %   MCV 72.6 (L)  78.0 - 100.0 fL   MCH 22.6 (L) 26.0 - 34.0 pg   MCHC 31.1 30.0 - 36.0 g/dL   RDW 18.8 (H) 11.5 - 15.5 %   Platelets 361 150 - 400 K/uL   Neutrophils Relative % 94 %   Neutro Abs 48.8 (H) 1.7 - 7.7 K/uL   Lymphocytes Relative 2 %   Lymphs Abs 0.9 0.7 -  4.0 K/uL   Monocytes Relative 4 %   Monocytes Absolute 1.9 (H) 0.1 - 1.0 K/uL   Eosinophils Relative 0 %   Eosinophils Absolute 0.0 0.0 - 0.7 K/uL   Basophils Relative 0 %   Basophils Absolute 0.1 0.0 - 0.1 K/uL   WBC Morphology WHITE COUNT CONFIRMED ON SMEAR     Comment: Performed at [facility], [place] [address], [place], [place] [address]  Protime-INR     Status: None   Collection Time: 10/11/2017  8:20 PM  Result Value Ref Range   Prothrombin Time 13.0 11.4 - 15.2 seconds   INR 0.99     Comment: Performed at [facility], [place] [address], [place], [place] [address]  I-Stat CG4 Lactic Acid, ED     Status: Abnormal   Collection Time: 10/25/2017  8:33 PM  Result Value Ref Range   Lactic Acid, Venous 2.37 (HH) 0.5 - 1.9 mmol/L   Comment NOTIFIED PHYSICIAN   I-Stat CG4 Lactic Acid, ED     Status: None   Collection Time: 10/14/2017 11:07 PM  Result Value Ref Range   Lactic Acid, Venous 1.65 0.5 - 1.9 mmol/L  Basic metabolic panel     Status: Abnormal   Collection Time: 11/20/2017  4:53 AM  Result Value Ref Range   Sodium 135 135 - 145 mmol/L   Potassium 4.6 3.5 - 5.1 mmol/L   Chloride 101 98 - 111 mmol/L   CO2 28 22 - 32 mmol/L   Glucose, Bld 118 (H) 70 - 99 mg/dL   BUN 28 (H) 6 - 20 mg/dL   Creatinine, Ser 1.74 (H) 0.61 - 1.24 mg/dL   Calcium >15.0 (HH) 8.9 - 10.3 mg/dL    Comment: CRITICAL RESULT CALLED TO, READ BACK BY AND VERIFIED WITH: RYAN,T RN AT 0605 12/04/2017 BY TIBBITTS,K    GFR calc non Af Amer 47 (L) >60 mL/min   GFR calc Af Amer 54 (L) >60 mL/min    Comment: (NOTE) The eGFR has been calculated using the CKD EPI equation. This calculation has not been validated in all clinical  situations. eGFR's persistently <60 mL/min signify possible Chronic Kidney Disease.    Anion gap 6 5 - 15    Comment: Performed at [facility], [place] [address], [place], [place] [address]  CBC     Status: Abnormal   Collection Time: 11/28/2017  4:53 AM  Result Value Ref Range   WBC 61.3 (HH) 4.0 - 10.5 K/uL    Comment: CRITICAL VALUE NOTED.  VALUE IS CONSISTENT WITH PREVIOUSLY REPORTED AND CALLED VALUE.   RBC 3.75 (L) 4.22 - 5.81 MIL/uL   Hemoglobin 8.7 (L) 13.0 - 17.0 g/dL   HCT 26.8 (L) 39.0 - 52.0 %   MCV 71.5 (L) 78.0 - 100.0 fL   MCH 23.2 (L) 26.0 - 34.0 pg   MCHC 32.5 30.0 - 36.0 g/dL   RDW 18.8 (H) 11.5 - 15.5 %   Platelets 469 (H) 150 - 400 K/uL    Comment: Performed at [facility], [place] [address] [place], [place] [address]  MRSA PCR Screening     Status: None   Collection Time: 12/09/2017 10:22 AM  Result Value Ref Range   MRSA by PCR NEGATIVE NEGATIVE    Comment:        The GeneXpert MRSA Assay (FDA approved for NASAL specimens only), is one component of a comprehensive MRSA colonization surveillance program. It is not intended to  diagnose MRSA infection nor to guide or monitor treatment for MRSA infections. Performed at [facility], [place] [address], [place], [place] [address]   Basic metabolic panel     Status: Abnormal   Collection Time: 11/10/17  8:40 AM  Result Value Ref Range   Sodium 137 135 - 145 mmol/L   Potassium 4.7 3.5 - 5.1 mmol/L   Chloride 102 98 - 111 mmol/L   CO2 27 22 - 32 mmol/L   Glucose, Bld 96 70 - 99 mg/dL   BUN 25 (H) 6 - 20 mg/dL   Creatinine, Ser 1.63 (H) 0.61 - 1.24 mg/dL   Calcium 13.0 (H) 8.9 - 10.3 mg/dL   GFR calc non Af Amer 51 (L) >60 mL/min   GFR calc Af Amer 59 (L) >60 mL/min    Comment: (NOTE) The eGFR has been calculated using the CKD EPI equation. This calculation has not been validated in all clinical situations. eGFR's persistently <60 mL/min signify possible  Chronic Kidney Disease.    Anion gap 8 5 - 15    Comment: Performed at [facility], [place] [address], [place], [place] [address]  CBC with Differential/Platelet     Status: Abnormal   Collection Time: 11/10/17  8:40 AM  Result Value Ref Range   WBC 76.3 (HH) 4.0 - 10.5 K/uL    Comment: CRITICAL VALUE NOTED.  VALUE IS CONSISTENT WITH PREVIOUSLY REPORTED AND CALLED VALUE.   RBC 3.81 (L) 4.22 - 5.81 MIL/uL   Hemoglobin 9.0 (L) 13.0 - 17.0 g/dL   HCT 27.4 (L) 39.0 - 52.0 %   MCV 71.9 (L) 78.0 - 100.0 fL   MCH 23.6 (L) 26.0 - 34.0 pg   MCHC 32.8 30.0 - 36.0 g/dL   RDW 18.9 (H) 11.5 - 15.5 %   Platelets 395 150 - 400 K/uL   Neutrophils Relative % 98 %   Lymphocytes Relative 1 %   Monocytes Relative 1 %   Eosinophils Relative 0 %   Basophils Relative 0 %   Neutro Abs 74.7 (H) 1.7 - 7.7 K/uL   Lymphs Abs 0.8 0.7 - 4.0 K/uL   Monocytes Absolute 0.8 0.1 - 1.0 K/uL   Eosinophils Absolute 0.0 0.0 - 0.7 K/uL   Basophils Absolute 0.0 0.0 - 0.1 K/uL   WBC Morphology MILD LEFT SHIFT (1-5% METAS, OCC MYELO, OCC BANDS)     Comment: Performed at [facility], [place] [address], [place], [place] [address]   Recent Results (from the past 240 hour(s))  Urine culture     Status: None (Preliminary result)   Collection Time: 11/01/2017  7:45 PM  Result Value Ref Range Status   Specimen Description   Final    URINE, CLEAN CATCH Performed at [facility], [place] [address], [place], [place] [address]    Special Requests   Final    NONE Performed at [facility], [place] [address], [place], [place] [address]    Culture   Final    CULTURE REINCUBATED FOR BETTER GROWTH Performed at [facility], [place] [address] [place], [place] [address]    Report Status PENDING  Incomplete  MRSA PCR Screening     Status: None   Collection Time: 12/03/2017 10:22 AM  Result Value Ref Range Status   MRSA by PCR NEGATIVE NEGATIVE Final    Comment:         The GeneXpert MRSA Assay (FDA approved for NASAL specimens  only), is one component of a comprehensive MRSA colonization surveillance program. It is not intended to diagnose MRSA infection nor to guide or monitor treatment for MRSA infections. Performed at [facility], [place] [address], [place], [place] [address]    Creatinine: Recent Labs    11/04/2017 2020 11/30/2017 0453 11/10/17 0840  CREATININE 1.89* 1.74* 1.63*   Baseline Creatinine: 1  Impression/Assessment:  41yo with metastatic penile cancer  Plan:  I discussed the management of penile cancer with the patient. He is currently not a candidate for therapy and due to his poor prognosis I would recommend palliative care and hospice.    Ronald Wilson 11/10/2017, 9:45 AM

## 2017-11-10 NOTE — Progress Notes (Signed)
Daily Progress Note   Patient Name: Ronald Wilson       Date: 11/10/2017 DOB: [birth year]  Age: 42 y.o. MRN#: [id number] Attending Physician: Shelly Coss, MD Primary Care Physician: [name], FNP Admit Date: 10/29/2017  Reason for Consultation/Follow-up: Establishing goals of care  Subjective: I met again today with Ronald Wilson.  Ronald Wilson is more awake today and remembers meeting with me last evening.  Ronald Wilson is able to partially recap our conversation, but reports some details "a little fuzzy."  Ronald Wilson has not spoke with his father or any family regarding his illness and was adamant that I not call him.  I asked him who we should look to in order to make decisions if Ronald Wilson cannot make them for himself, and Ronald Wilson stated that Ronald Wilson did not want anyone to know anything other than himself.  We reviewed again regarding his clinical course and wishes moving forward at length.  We reviewed discussion where Ronald Wilson had told Dr. Tawanna Solo that Ronald Wilson wanted to transition to residential hospice.  Last evening, Ronald Wilson told me that Ronald Wilson would need to consider further once we discussed that plan with residential hospice would not include continuation of IV antibiotics.  Today, Ronald Wilson reports that Ronald Wilson is not wanting to pursue transition to residential hospice at this point and desires to continue current therapies to see how Ronald Wilson does over the next couple of days.  We also reviewed that Ronald Wilson does not want to be placed on life support of have aggressive interventions in the event of cardiac or respiratory arrest.  Length of Stay: 2  Current Medications: Scheduled Meds:  . amLODipine  10 mg Oral Daily  . calcitonin  4 Units/kg Intramuscular BID  . enoxaparin (LOVENOX) injection  40 mg Subcutaneous QHS  . morphine  30 mg Oral Q12H     Continuous Infusions: . sodium chloride 150 mL/hr at 11/10/17 0520  . piperacillin-tazobactam (ZOSYN)  IV 3.375 g (11/10/17 0519)  . vancomycin 1,250 mg (11/30/2017 2127)    PRN Meds: acetaminophen **OR** acetaminophen, HYDROmorphone (DILAUDID) injection, ondansetron **OR** ondansetron (ZOFRAN) IV, oxyCODONE-acetaminophen **AND** oxyCODONE, prochlorperazine, senna-docusate, sodium chloride flush  Physical Exam         General: Alert, awake, in no acute distress but appears tired and drifts off during conversation.  HEENT: No bruits, no goiter, no JVD  Heart: Tachycardic. No murmur appreciated. Lungs: Good air movement, clear Abdomen: Soft, nontender, nondistended, positive bowel sounds.  Skin: Warm and dry Neuro: Grossly intact, nonfocal.  Vital Signs: BP 132/74 (BP Location: Left Arm)   Pulse (!) 139   Temp 98.8 F (37.1 C) (Oral)   Resp 18   Ht 5' 9" (1.753 m)   Wt 83.9 kg (185 lb)   SpO2 100%   BMI 27.32 kg/m  SpO2: SpO2: 100 % O2 Device: O2 Device: Room Air O2 Flow Rate:    Intake/output summary:   Intake/Output Summary (Last 24 hours) at 11/10/2017 1155 Last data filed at 11/10/2017 0600 Gross per 24 hour  Intake 3333.11 ml  Output -  Net 3333.11 ml   LBM: Last BM Date: 11/02/17 Baseline Weight: Weight: 83.9 kg (185 lb) Most recent weight: Weight: 83.9 kg (185 lb)       Palliative Assessment/Data:      Patient Active Problem List   Diagnosis Date Noted  . Hypercalcemia of malignancy 11/17/2017  . Leukocytosis 11/01/2017  . Lactic acidosis 10/16/2017  . Port-A-Cath in place 07/19/2017  . Wound, open 06/28/2017  . Palliative care by specialist   . Penile cancer ([place]) 06/23/2017  . Sepsis secondary to UTI ([place]) 06/23/2017  . Anemia associated with chemotherapy 06/23/2017  . DVT (deep venous thrombosis) ([place]) 06/23/2017  . Sepsis ([place]) 06/23/2017  . Goals of care, counseling/discussion 12/16/2016  . Penis cancer ([place]) 12/16/2016  . Inguinal  lymphadenopathy   . Inguinal mass 11/17/2016  . 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 Anemia 11/17/2016  . Unilateral edema of left lower extremity 11/17/2016  . Tobacco use disorder 11/17/2016    Palliative Care Assessment & Plan   Patient Profile: 42 y.o. 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  Ronald Wilson is not a candidate for further disease modifying therapy and has been asking about hospice support.  Palliative consulted for goals of care.   Assessment: Patient Active Problem List   Diagnosis Date Noted  . Hypercalcemia of malignancy 11/19/2017  . Leukocytosis 10/22/2017  . Lactic acidosis 10/24/2017  . Port-A-Cath in place 07/19/2017  . Wound, open 06/28/2017  . Palliative care by specialist   . Penile cancer ([place]) 06/23/2017  . Sepsis secondary to UTI ([place]) 06/23/2017  . Anemia associated with chemotherapy 06/23/2017  . DVT (deep venous thrombosis) ([place]) 06/23/2017  . Sepsis ([place]) 06/23/2017  . Goals of care, counseling/discussion 12/16/2016  . Penis cancer ([place]) 12/16/2016  . Inguinal lymphadenopathy   . Inguinal mass 11/17/2016  . 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 Anemia 11/17/2016  . Unilateral edema of left lower extremity 11/17/2016  . Tobacco use disorder 11/17/2016   Recommendations/Plan:  DNR/DNI  I spoke again at length with Ronald Wilson today.  In talking with him, his belief was that residential hospice would continue aggressive treatments including continuation of IV antibiotics.  Ronald Wilson reports today that Ronald Wilson wants to continue with current interventions and is not at a point where Ronald Wilson is considering transition to full comfort care.  We discussed that Ronald Wilson needs to talk with family regarding his disease and prognosis.  Ronald Wilson was adamant today that I not speak with family.  We have made a plan to meet again tomorrow to continue conversation regarding goals of care to be further determined by clinical course.  I  shared with him my concern that Ronald Wilson will continue to worsen regardless of interventions moving  forward.      Code Status:    Code Status Orders  (From admission, onward)        Start     Ordered   10/17/2017 2253  Do not attempt resuscitation (DNR)  Continuous    Question Answer Comment  In the event of cardiac or respiratory ARREST Do not call a "code blue"   In the event of cardiac or respiratory ARREST Do not perform Intubation, CPR, defibrillation or ACLS   In the event of cardiac or respiratory ARREST Use medication by any route, position, wound care, and other measures to relive pain and suffering. May use oxygen, suction and manual treatment of airway obstruction as needed for comfort.      11/07/2017 2252    Code Status History    Date Active Date Inactive Code Status Order ID Comments User Context   10/10/2017 2218 10/14/2017 2252 DNR [id number]  Daleen Bo, MD ED   06/22/2017 2120 06/28/2017 1801 Full Code [id number]  Hosie Poisson, MD Inpatient   11/17/2016 2218 11/23/2016 1515 Full Code [id number]  Reubin Milan, MD Inpatient    Advance Directive Documentation     Most Recent Value  Type of Advance Directive  Healthcare Power of Attorney  Pre-existing out of facility DNR order (yellow form or pink MOST form)  -  "MOST" Form in Place?  -       Prognosis:   Guarded  Discharge Planning:  To Be Determined  Care plan was discussed with Patient, RN  Thank you for allowing the Palliative Medicine Team to assist in the care of this patient.   Time In: 1040 Time Out: 1155 Total Time 75 Prolonged Time Billed Yes      Greater than 50%  of this time was spent counseling and coordinating care related to the above assessment and plan.  Micheline Rough, MD  Please contact Palliative Medicine Team phone at [phone] for questions and concerns.

## 2017-11-10 NOTE — Progress Notes (Signed)
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  Patient currently being managed for possible sepsis secondary to penile lesions.  Patient also found to have severe hypercalcemia of malignancy on presentation.  Given a dose of Zometa and started on calcitonin.  Palliative care is following.  Plan for possible transition his care to residential hospice.  Assessment & Plan:   Principal Problem:   Sepsis ([place]) Active Problems:   Inguinal mass   Ulcer of penis   Anemia   Penile cancer ([facility])   Wound, open   Port-A-Cath in place   Leukocytosis   Lactic acidosis   Hypercalcemia of malignancy  Sepsis: Presented with leukocytosis, tachycardia, fever, lactic acidosis.  Most likely source is  infected penile ulcers.  Continue IV fluids.  We will follow-up blood cultures.  Continue broad-spectrum antibiotics.  Metastatic squamous cell carcinoma of the penis:  Metastatic disease including pulmonary nodules and diffuse lymphadenopathy. He was diagnosed in 2018. S/P cisplatin and 5-FU palliative chemotherapy. He completed 3 cycles in November 2018 and developed progression of disease. He also had radiation therapy between April 05, 2017 and April 20, 2017. He received 30 Gy in 10 fractions to the pelvis and the periaortic areas.  On 10/19/2017 he had a follow up with Dr. Osker Mason his oncologist at which visit they decided to forego further therapy for the time being and consider Hospice. The patient has met with Palliative Care but has not decided on hospice yet.  Currently patient expresses desire to be transferred to  residential hospice.  He says hospice at home is not possible because of his elderly father who cannot take care of him. I have requested for palliative care consultation and they are following.  I have also consulted social worker for contacting residential hospice.  Patient will make final decision today after talking to his father.  Penile ulcers/metastatic lesions/penile swelling: Urology already evaluated him.  Wound care following.  Hypercalcemia of malignancy: We will continue IV fluids.  Started on calcitonin.  Will give a dose of Zometa.  Calcium level improved this morning.  Severe leukocytosis: Most likely history of malignancy and sepsis.  Continue antibiotics.  We will continue monitor the WBC trend. Follow-up cultures.  Acute kidney injury: Likely prerenal.  We will continue IV fluids.  We will continue to monitor kidney function.  Anemia: Most likely associated with malignancy.  We will continue to monitor his CBC   DVT prophylaxis: Lovenox Code Status: DNR Family Communication: None present at the bedside Disposition Plan: Hospice  Consultants: Urology, palliative care  Procedures: None  Antimicrobials: Vancomycin and Zosyn Day2  Subjective: Patient seen and examined the bedside this morning.  Remains tachycardic.  Feels weak and complains of pain on the penile area.  Objective: Vitals:   12/06/2017 0548 12/05/2017 1421 11/21/2017 2106 11/10/17 0427  BP: 134/84 (!) 140/93 127/81 132/74  Pulse: (!) 128 (!) 138 (!) 134 (!) 139  Resp: _0 Temp: 98 F (36.7 C) (!) 97.3 F (36.3 C) 99 F (37.2 C) 98.8 F (37.1 C)  TempSrc: Oral Oral Oral Oral  SpO2: 95% 100% 96% 100%  Weight:  Height:        Intake/Output Summary (Last 24 hours) at 11/10/2017 1215 Last data filed at 11/10/2017 0600 Gross per 24 hour  Intake 3333.11 ml  Output -  Net 3333.11 ml   Filed Weights   10/13/2017 1909  Weight: 83.9 kg (185 lb)    Examination:  General exam: Not in  distress,average built,chronically ill HEENT:PERRL,Oral mucosa moist, Ear/Nose normal on gross exam Respiratory system: Bilateral equal air entry, normal vesicular breath sounds, no wheezes or crackles  Cardiovascular system: Sinus tachycardia, No JVD, murmurs, rubs, gallops or clicks. No pedal edema. Gastrointestinal system: Abdomen is nondistended, soft and nontender. No organomegaly or masses felt. Normal bowel sounds heard. Central nervous system: Alert and oriented. No focal neurological deficits. Extremities: No edema, no clubbing ,no cyanosis, distal peripheral pulses palpable. Skin: No rashes, lesions or ulcers,no icterus ,no pallor MSK: Normal muscle bulk,tone ,power Psychiatry: Judgement and insight appear normal. Mood & affect appropriate.  GU: Multiple penile ulcers, abscesses, nodules, bilateral inguinal lymphadenopathy with ulcers,foul smell    Data Reviewed: I have personally reviewed following labs and imaging studies  CBC: Recent Labs  Lab 10/19/2017 2020 11/15/2017 0453 11/10/17 0840  WBC 51.6* 61.3* 76.3*  NEUTROABS 48.8*  --  74.7*  HGB 9.3* 8.7* 9.0*  HCT 29.9* 26.8* 27.4*  MCV 72.6* 71.5* 71.9*  PLT 361 469* 096   Basic Metabolic Panel: Recent Labs  Lab 10/12/2017 2020 11/20/2017 0453 11/10/17 0840  NA 136 135 137  K 4.9 4.6 4.7  CL 97* 101 102  CO2 _0 GLUCOSE 123* 118* 96  BUN 30* 28* 25*  CREATININE 1.89* 1.74* 1.63*  CALCIUM >15.0* >15.0* 13.0*   GFR: Estimated Creatinine Clearance: 59.6 mL/min (A) (by C-G formula based on SCr of 1.63 mg/dL (H)). Liver Function Tests: Recent Labs  Lab 10/11/2017 2020  AST 16  ALT 11  ALKPHOS 161*  BILITOT 0.2*  PROT 7.6  ALBUMIN 2.8*   No results for input(s): LIPASE, AMYLASE in the last 168 hours. No results for input(s): AMMONIA in the last 168 hours. Coagulation Profile: Recent Labs  Lab 10/12/2017 2020  INR 0.99   Cardiac Enzymes: No results for input(s): CKTOTAL, CKMB, CKMBINDEX, TROPONINI in  the last 168 hours. BNP (last 3 results) No results for input(s): PROBNP in the last 8760 hours. HbA1C: No results for input(s): HGBA1C in the last 72 hours. CBG: No results for input(s): GLUCAP in the last 168 hours. Lipid Profile: No results for input(s): CHOL, HDL, LDLCALC, TRIG, CHOLHDL, LDLDIRECT in the last 72 hours. Thyroid Function Tests: No results for input(s): TSH, T4TOTAL, FREET4, T3FREE, THYROIDAB in the last 72 hours. Anemia Panel: No results for input(s): VITAMINB12, FOLATE, FERRITIN, TIBC, IRON, RETICCTPCT in the last 72 hours. Sepsis Labs: Recent Labs  Lab 11/07/2017 2033 11/03/2017 2307  LATICACIDVEN 2.37* 1.65    Recent Results (from the past 240 hour(s))  Urine culture     Status: None (Preliminary result)   Collection Time: 11/06/2017  7:45 PM  Result Value Ref Range Status   Specimen Description   Final    URINE, CLEAN CATCH Performed at [facility] [address], [place], [place] [address]    Special Requests   Final    NONE Performed at [facility], [place] [address] [place], [place] [address]    Culture   Final    CULTURE REINCUBATED FOR BETTER GROWTH Performed at [facility], [place],  [place] [address]    Report Status PENDING  Incomplete  Culture, blood (Routine x 2)     Status: None (Preliminary result)   Collection Time: 11/03/2017  8:42 PM  Result Value Ref Range Status   Specimen Description   Final    BLOOD PORTA CATH Performed at [facility] [address] [place], [place] [address]    Special Requests   Final    BOTTLES DRAWN AEROBIC AND ANAEROBIC Blood Culture adequate volume Performed at [facility] [address] [place], [place] [address]    Culture   Final    NO GROWTH 1 DAY Performed at [facility], [place] [address] [place], [place] [address]    Report Status PENDING  Incomplete  Culture, blood (Routine x 2)     Status:  None (Preliminary result)   Collection Time: 11/05/2017  8:51 PM  Result Value Ref Range Status   Specimen Description   Final    BLOOD LEFT HAND Performed at [facility] [address] [place], [place] [address]    Special Requests   Final    BOTTLES DRAWN AEROBIC AND ANAEROBIC Blood Culture adequate volume Performed at [facility] [address] [place], [place] [address]    Culture   Final    NO GROWTH 1 DAY Performed at [facility], [place] [address] [place], [place] [address]    Report Status PENDING  Incomplete  MRSA PCR Screening     Status: None   Collection Time: 11/16/2017 10:22 AM  Result Value Ref Range Status   MRSA by PCR NEGATIVE NEGATIVE Final    Comment:        The GeneXpert MRSA Assay (FDA approved for NASAL specimens only), is one component of a comprehensive MRSA colonization surveillance program. It is not intended to diagnose MRSA infection nor to guide or monitor treatment for MRSA infections. Performed at [facility], [place] [address] [place], [place] [address]          Radiology Studies: Dg Chest 2 View  Result Date: 10/24/2017 CLINICAL DATA:  Sepsis, fever, cancer patient EXAM: CHEST - 2 VIEW COMPARISON:  CT chest dated 10/17/2017 FINDINGS: At least 3 left upper lobe pulmonary nodules, better evaluated on recent CT. Additional pulmonary metastases are not radiographically evident. No focal consolidation.  No pleural effusion or pneumothorax. Heart is normal in size. Right chest power port terminating at the cavoatrial junction. IMPRESSION: At least 3 left upper lobe pulmonary nodules, with additional pulmonary metastases better evaluated on recent CT. No evidence of acute cardiopulmonary disease. Electronically Signed   By: Julian Hy M.D.   On: 10/14/2017 23:40        Scheduled Meds: . amLODipine  10 mg Oral Daily  . calcitonin  4 Units/kg Intramuscular BID  .  enoxaparin (LOVENOX) injection  40 mg Subcutaneous QHS  . morphine  30 mg Oral Q12H   Continuous Infusions: . sodium chloride 150 mL/hr at 11/10/17 0520  . piperacillin-tazobactam (ZOSYN)  IV 3.375 g (11/10/17 0519)  . vancomycin 1,250 mg (11/10/2017 2127)     LOS: 2 days    Time spent: 25  mins.More than 50% of that time was spent in counseling and/or coordination of care.      Shelly Coss, MD [facility] Pager [phone]  If 7PM-7AM, please contact night-coverage [email or web address] Password [facility] 11/10/2017, 12:15 PM

## 2017-11-11 DIAGNOSIS — D72823 Leukemoid reaction: Secondary | ICD-10-CM

## 2017-11-11 LAB — CBC WITH DIFFERENTIAL/PLATELET
BAND NEUTROPHILS: 3 %
BASOS PCT: 0 %
Basophils Absolute: 0 10*3/uL (ref 0.0–0.1)
EOS PCT: 0 %
Eosinophils Absolute: 0 10*3/uL (ref 0.0–0.7)
HEMATOCRIT: 28.1 % — AB (ref 39.0–52.0)
HEMOGLOBIN: 9 g/dL — AB (ref 13.0–17.0)
Lymphocytes Relative: 2 %
Lymphs Abs: 1.8 10*3/uL (ref 0.7–4.0)
MCH: 23.7 pg — AB (ref 26.0–34.0)
MCHC: 32 g/dL (ref 30.0–36.0)
MCV: 73.9 fL — AB (ref 78.0–100.0)
MYELOCYTES: 1 %
Monocytes Absolute: 0.9 10*3/uL (ref 0.1–1.0)
Monocytes Relative: 1 %
NEUTROS ABS: 86.3 10*3/uL — AB (ref 1.7–7.7)
Neutrophils Relative %: 93 %
Platelets: 363 10*3/uL (ref 150–400)
RBC: 3.8 MIL/uL — ABNORMAL LOW (ref 4.22–5.81)
RDW: 18.9 % — ABNORMAL HIGH (ref 11.5–15.5)
WBC: 89 10*3/uL (ref 4.0–10.5)

## 2017-11-11 LAB — BASIC METABOLIC PANEL
Anion gap: 12 (ref 5–15)
BUN: 28 mg/dL — AB (ref 6–20)
CHLORIDE: 101 mmol/L (ref 98–111)
CO2: 22 mmol/L (ref 22–32)
Calcium: 11.4 mg/dL — ABNORMAL HIGH (ref 8.9–10.3)
Creatinine, Ser: 1.6 mg/dL — ABNORMAL HIGH (ref 0.61–1.24)
GFR calc Af Amer: >60 mL/min (ref >60)
GFR calc non Af Amer: 52 mL/min — ABNORMAL LOW (ref >60)
Glucose, Bld: 108 mg/dL — ABNORMAL HIGH (ref 70–99)
POTASSIUM: 4 mmol/L (ref 3.5–5.1)
SODIUM: 135 mmol/L (ref 135–145)

## 2017-11-11 LAB — URINE CULTURE

## 2017-11-11 MED ORDER — ALUM & MAG HYDROXIDE-SIMETH 200-200-20 MG/5ML PO SUSP
30.0000 mL | Freq: Four times a day (QID) | ORAL | Status: DC | PRN
  Administered 2017-11-11: 30 mL via ORAL
  Filled 2017-11-11: qty 30

## 2017-11-11 MED ORDER — ALTEPLASE 2 MG IJ SOLR
2.0000 mg | Freq: Once | INTRAMUSCULAR | Status: DC
  Filled 2017-11-11: qty 2

## 2017-11-11 NOTE — Progress Notes (Signed)
Daily Progress Note   Patient Name: Ronald Wilson       Date: 11/11/2017 DOB: 1975-10-21  Age: 42 y.o. MRN#: [id number] Attending Physician: Shelly Coss, MD Primary Care Physician: [name], FNP Admit Date: 10/25/2017  Reason for Consultation/Follow-up: Establishing goals of care  Subjective: I met again today with Ronald Wilson.    We discussed again about his current condition and that he has incurable illness with infection that will likely be terminal event.  Discussed that even if antibiotics are effective, underlying wounds will not heal, infection will recur, and there is no "fix" for this.    He has not spoke with his father or any family regarding his illness and remains adamant that I not call him.  I again asked him who we should look to in order to make decisions if he cannot make them for himself, and he stated that he did not want anyone to know anything other than himself.  I discussed again with him regarding recommendation for residential hospice.  He reports that this is still something that he is considering, but he is not agreeable at this point and wants to "keep with what we are doing for the next couple of days."  Length of Stay: 3  Current Medications: Scheduled Meds:  . alteplase  2 mg Intracatheter Once  . amLODipine  10 mg Oral Daily  . enoxaparin (LOVENOX) injection  40 mg Subcutaneous QHS  . morphine  30 mg Oral Q12H    Continuous Infusions: . sodium chloride 75 mL/hr at 11/11/17 1333  . piperacillin-tazobactam (ZOSYN)  IV 3.375 g (11/11/17 1338)  . vancomycin Stopped (11/10/17 2230)    PRN Meds: acetaminophen **OR** acetaminophen, HYDROmorphone (DILAUDID) injection, ondansetron **OR** ondansetron (ZOFRAN) IV, oxyCODONE-acetaminophen  **AND** oxyCODONE, prochlorperazine, senna-docusate, sodium chloride flush  Physical Exam         General: Alert, awake, in no acute distress.  HEENT: No bruits, no goiter, no JVD Heart: Tachycardic. No murmur appreciated. Lungs: Good air movement, clear Abdomen: Soft, nontender, nondistended, positive bowel sounds.  Skin: Warm and dry Neuro: Grossly intact, nonfocal.  Vital Signs: BP 118/66 (BP Location: Right Arm)   Pulse (!) 133   Temp 97.7 F (36.5 C) (Oral)   Resp 20   Ht 5' 9"  (1.753 m)   Wt 83.9 kg (  185 lb)   SpO2 93%   BMI 27.32 kg/m  SpO2: SpO2: 93 % O2 Device: O2 Device: Room Air O2 Flow Rate:    Intake/output summary:   Intake/Output Summary (Last 24 hours) at 11/11/2017 1725 Last data filed at 11/11/2017 1500 Gross per 24 hour  Intake 2723.61 ml  Output -  Net 2723.61 ml   LBM: Last BM Date: 11/02/17 Baseline Weight: Weight: 83.9 kg (185 lb) Most recent weight: Weight: 83.9 kg (185 lb)       Palliative Assessment/Data:      Patient Active Problem List   Diagnosis Date Noted  . Leukemoid reaction 11/11/2017  . Hypercalcemia of malignancy 11/22/2017  . Leukocytosis 10/22/2017  . Lactic acidosis 11/05/2017  . Port-A-Cath in place 07/19/2017  . Wound, open 06/28/2017  . Palliative care by specialist   . Penile cancer ([place]) 06/23/2017  . Sepsis secondary to UTI ([place]) 06/23/2017  . Anemia associated with chemotherapy 06/23/2017  . DVT (deep venous thrombosis) ([place]) 06/23/2017  . Sepsis ([place]) 06/23/2017  . Goals of care, counseling/discussion 12/16/2016  . Penis cancer ([place]) 12/16/2016  . Inguinal lymphadenopathy   . Inguinal mass 11/17/2016  . 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 Anemia 11/17/2016  . Unilateral edema of left lower extremity 11/17/2016  . Tobacco use disorder 11/17/2016    Palliative Care Assessment & Plan   Patient Profile: 42 y.o. 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  He is not a candidate for further disease modifying therapy and has been asking about hospice support.  Palliative consulted for goals of care.   Assessment: Patient Active Problem List   Diagnosis Date Noted  . Leukemoid reaction 11/11/2017  . Hypercalcemia of malignancy 11/29/2017  . Leukocytosis 10/11/2017  . Lactic acidosis 11/01/2017  . Port-A-Cath in place 07/19/2017  . Wound, open 06/28/2017  . Palliative care by specialist   . Penile cancer ([place]) 06/23/2017  . Sepsis secondary to UTI ([place]) 06/23/2017  . Anemia associated with chemotherapy 06/23/2017  . DVT (deep venous thrombosis) ([place]) 06/23/2017  . Sepsis ([place]) 06/23/2017  . Goals of care, counseling/discussion 12/16/2016  . Penis cancer ([place]) 12/16/2016  . Inguinal lymphadenopathy   . Inguinal mass 11/17/2016  . 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 Anemia 11/17/2016  . Unilateral edema of left lower extremity 11/17/2016  . Tobacco use disorder 11/17/2016   Recommendations/Plan:  DNR/DNI  I spoke again at length with Ronald Wilson today.  We discussed again about residential hospice and he maintatins that he wants to continue with current interventions and is not at a point where he is open to transition to full comfort care and residential hospice.  We discussed that he needs to talk with family regarding his disease and prognosis.  He remains adamant today that I not speak with family.  I shared with him my concern that he is going to lose the ability to make his own decisions.  He reports having 7 children (oldest 27, youngest 61, 2 of them are legal adults- 18 and 20 respectively).  Informed him that in the event he is not able to make his own decisions, his adult children and and father will be asked to make decisions on his behalf.  Reported again that he will speak to his father.  I am concerned he will continue to decline and we are going to end up at a point where he is no longer decisional  and we are forced  to look to family, who does not know he is ill, to make decisions on his behalf.  I shared this concern with him today.  Code Status:    Code Status Orders  (From admission, onward)        Start     Ordered   11/04/2017 2253  Do not attempt resuscitation (DNR)  Continuous    Question Answer Comment  In the event of cardiac or respiratory ARREST Do not call a "code blue"   In the event of cardiac or respiratory ARREST Do not perform Intubation, CPR, defibrillation or ACLS   In the event of cardiac or respiratory ARREST Use medication by any route, position, wound care, and other measures to relive pain and suffering. May use oxygen, suction and manual treatment of airway obstruction as needed for comfort.      10/11/2017 2252    Code Status History    Date Active Date Inactive Code Status Order ID Comments User Context   10/10/2017 2218 11/02/2017 2252 DNR [id number]  Daleen Bo, MD ED   06/22/2017 2120 06/28/2017 1801 Full Code [id number]  Hosie Poisson, MD Inpatient   11/17/2016 2218 11/23/2016 1515 Full Code [id number]  Reubin Milan, MD Inpatient    Advance Directive Documentation     Most Recent Value  Type of Advance Directive  Healthcare Power of Attorney  Pre-existing out of facility DNR order (yellow form or pink MOST form)  -  "MOST" Form in Place?  -       Prognosis:   Guarded  Discharge Planning:  To Be Determined  Care plan was discussed with Patient, RN  Thank you for allowing the Palliative Medicine Team to assist in the care of this patient.   Total Time 45 Prolonged Time Billed Yes      Greater than 50%  of this time was spent counseling and coordinating care related to the above assessment and plan.  Micheline Rough, MD  Please contact Palliative Medicine Team phone at [phone] for questions and concerns.

## 2017-11-11 NOTE — Progress Notes (Signed)
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  Patient currently being managed for possible sepsis secondary to penile lesions.  Palliative care following.  Plan is to possibly discharge him to residential hospice  Assessment & Plan:   Principal Problem:   Sepsis ([place]) Active Problems:   Inguinal mass   Ulcer of penis   Anemia   Penile cancer (H[facility])   Wound, open   Port-A-Cath in place   Leukocytosis   Lactic acidosis   Hypercalcemia of malignancy   Leukemoid reaction  Sepsis: Presented with leukocytosis, tachycardia, fever, lactic acidosis.  Most likely source is  infected penile ulcers.  Continue IV fluids.  We will follow-up blood cultures.  Continue broad-spectrum antibiotics.  Leukocytosis: Severe.  Likely leukemoid reaction .white cell counts trending up.  Metastatic squamous cell carcinoma of the penis:  Metastatic disease including pulmonary nodules and diffuse lymphadenopathy. He was diagnosed in 2018. S/P cisplatin and 5-FU palliative chemotherapy. He completed 3 cycles in November 2018 and developed progression of disease. He also had radiation therapy between April 05, 2017 and April 20, 2017. He received 30 Gy in 10 fractions to the pelvis and the periaortic areas.  On 10/19/2017 he had a follow up with Dr. Osker Mason his oncologist at which visit they decided to forego further therapy for the time being and consider Hospice. Currently patient is yet to decide on  residential hospice.  He says hospice at home is not possible because of his elderly father who cannot take care of  him.Palliative care following.  I have also consulted social worker for contacting residential hospice.  Penile ulcers/metastatic lesions/penile swelling: Urology evaluated the  patient.  No intervention planned.  Wound care also following  Hypercalcemia of malignancy: We will continue IV fluids.  Started on calcitonin. Given a dose of Zometa.  Calcium level improving.  Severe leukocytosis: Most likely history of malignancy and sepsis.  Continue antibiotics.  We will continue monitor the WBC trend. Follow-up cultures.  Acute kidney injury: Likely prerenal but cud also be secondary to metastasis of penile cancer to the urinary system. we will continue gentle  IV fluids.  We will continue to monitor kidney function.  Anemia: Most likely associated with malignancy.  We will continue to monitor his CBC  Sinus tachycardia: Most likely secondary to pain, dehydration.  Continue fluids and pain medications.  DVT prophylaxis: Lovenox Code Status: DNR Family Communication: None present at the bedside Disposition Plan: Hospice  Consultants: Urology, palliative care  Procedures: None  Antimicrobials: Vancomycin and Zosyn  Subjective: Patient seen and examined the bedside this morning.  Remains tachycardic.  Feels weak and complains of pain on the penile area.  Feels better than yesterday.  Objective: Vitals:   11/10/17 0427 11/10/17 1406 11/10/17 1957 11/11/17 0350  BP: 132/74 120/68 126/82 116/74  Pulse: (!) 139 (!) 150 (!) 135 (!) 131  Resp: 18 14  (!) 22  Temp: 98.8 F (37.1 C) 98.7 F (37.1 C) 97.6 F (36.4 C) 97.7 F (36.5 C)  TempSrc: Oral Oral  Oral  SpO2: 100% 93% 96% 97%  Weight:      Height:  Intake/Output Summary (Last 24 hours) at 11/11/2017 1228 Last data filed at 11/11/2017 0830 Gross per 24 hour  Intake 2544.15 ml  Output -  Net 2544.15 ml   Filed Weights   11/05/2017 1909  Weight: 83.9 kg (185 lb)    Examination:  General exam: In mild to moderate  distress due to pain, average built,chronically ill HEENT:PERRL,Oral mucosa moist, Ear/Nose normal on gross exam Respiratory system: Bilateral equal air entry, normal vesicular breath sounds, no wheezes or crackles  Cardiovascular system: Sinus tachycardia, No JVD, murmurs, rubs, gallops or clicks. No pedal edema. Gastrointestinal system: Abdomen is nondistended, soft and nontender. No organomegaly or masses felt. Normal bowel sounds heard. Central nervous system: Alert and oriented. No focal neurological deficits. Extremities: No edema, no clubbing ,no cyanosis, distal peripheral pulses palpable. Skin: No rashes, lesions or ulcers,no icterus ,no pallor MSK: Normal muscle bulk,tone ,power Psychiatry: Judgement and insight appear normal. Mood & affect appropriate.  GU: Multiple penile ulcers, abscesses, nodules, bilateral inguinal lymphadenopathy with ulcers,foul smell    Data Reviewed: I have personally reviewed following labs and imaging studies  CBC: Recent Labs  Lab 10/20/2017 2020 11/17/2017 0453 11/10/17 0840 11/11/17 0542  WBC 51.6* 61.3* 76.3* 89.0*  NEUTROABS 48.8*  --  74.7* 86.3*  HGB 9.3* 8.7* 9.0* 9.0*  HCT 29.9* 26.8* 27.4* 28.1*  MCV 72.6* 71.5* 71.9* 73.9*  PLT 361 469* 395 024   Basic Metabolic Panel: Recent Labs  Lab 10/20/2017 2020 11/13/2017 0453 11/10/17 0840 11/11/17 0542  NA 136 135 137 135  K 4.9 4.6 4.7 4.0  CL 97* 101 102 101  CO2 29 28 27 22   GLUCOSE 123* 118* 96 108*  BUN 30* 28* 25* 28*  CREATININE 1.89* 1.74* 1.63* 1.60*  CALCIUM >15.0* >15.0* 13.0* 11.4*   GFR: Estimated Creatinine Clearance: 60.8 mL/min (A) (by C-G formula based on SCr of 1.6 mg/dL (H)). Liver Function Tests: Recent Labs  Lab 10/31/2017 2020  AST 16  ALT 11  ALKPHOS 161*  BILITOT 0.2*  PROT 7.6  ALBUMIN 2.8*   No results for input(s): LIPASE, AMYLASE in the last 168 hours. No results for input(s): AMMONIA in the last 168 hours. Coagulation Profile: Recent Labs  Lab  10/23/2017 2020  INR 0.99   Cardiac Enzymes: No results for input(s): CKTOTAL, CKMB, CKMBINDEX, TROPONINI in the last 168 hours. BNP (last 3 results) No results for input(s): PROBNP in the last 8760 hours. HbA1C: No results for input(s): HGBA1C in the last 72 hours. CBG: No results for input(s): GLUCAP in the last 168 hours. Lipid Profile: No results for input(s): CHOL, HDL, LDLCALC, TRIG, CHOLHDL, LDLDIRECT in the last 72 hours. Thyroid Function Tests: No results for input(s): TSH, T4TOTAL, FREET4, T3FREE, THYROIDAB in the last 72 hours. Anemia Panel: No results for input(s): VITAMINB12, FOLATE, FERRITIN, TIBC, IRON, RETICCTPCT in the last 72 hours. Sepsis Labs: Recent Labs  Lab 10/31/2017 2033 10/13/2017 2307  LATICACIDVEN 2.37* 1.65    Recent Results (from the past 240 hour(s))  Urine culture     Status: Abnormal   Collection Time: 11/07/2017  7:45 PM  Result Value Ref Range Status   Specimen Description   Final    URINE, CLEAN CATCH Performed at [facility] [address] [place], [place] [address]    Special Requests   Final    NONE Performed at [facility], [place] [address] [place], [place] [address]    Culture (A)  Final    >=100,000 COLONIES/mL  CORYNEBACTERIUM SPECIES Standardized susceptibility testing for this organism is not available. Performed at [facility], [place] [address] [place], [place] [address]    Report Status 11/11/2017 FINAL  Final  Culture, blood (Routine x 2)     Status: None (Preliminary result)   Collection Time: 10/12/2017  8:42 PM  Result Value Ref Range Status   Specimen Description   Final    BLOOD PORTA CATH Performed at [facility] [address], [place], [place] [address]    Special Requests   Final    BOTTLES DRAWN AEROBIC AND ANAEROBIC Blood Culture adequate volume Performed at [facility] [address], [place], [place] [address]    Culture    Final    NO GROWTH 1 DAY Performed at [facility], [place] [address] [place], [place] [address]    Report Status PENDING  Incomplete  Culture, blood (Routine x 2)     Status: None (Preliminary result)   Collection Time: 10/10/2017  8:51 PM  Result Value Ref Range Status   Specimen Description   Final    BLOOD LEFT HAND Performed at [facility] [address], [place], [place] [address]    Special Requests   Final    BOTTLES DRAWN AEROBIC AND ANAEROBIC Blood Culture adequate volume Performed at [facility] [address] [place], [place] [address]    Culture   Final    NO GROWTH 1 DAY Performed at [facility], [place] [address] [place], [place] [address]    Report Status PENDING  Incomplete  MRSA PCR Screening     Status: None   Collection Time: 11/13/2017 10:22 AM  Result Value Ref Range Status   MRSA by PCR NEGATIVE NEGATIVE Final    Comment:        The GeneXpert MRSA Assay (FDA approved for NASAL specimens only), is one component of a comprehensive MRSA colonization surveillance program. It is not intended to diagnose MRSA infection nor to guide or monitor treatment for MRSA infections. Performed at [facility], [place] [address], [place], [place] [address]          Radiology Studies: No results found.      Scheduled Meds: . alteplase  2 mg Intracatheter Once  . amLODipine  10 mg Oral Daily  . enoxaparin (LOVENOX) injection  40 mg Subcutaneous QHS  . morphine  30 mg Oral Q12H   Continuous Infusions: . sodium chloride 100 mL/hr at 11/11/17 1040  . piperacillin-tazobactam (ZOSYN)  IV 3.375 g (11/11/17 0529)  . vancomycin Stopped (11/10/17 2230)     LOS: 3 days    Time spent: 25  mins.More than 50% of that time was spent in counseling and/or coordination of care.      Shelly Coss, MD [facility] Pager [phone]  If 7PM-7AM, please contact  night-coverage [email or web address] Password [facility] 11/11/2017, 12:28 PM

## 2017-11-12 LAB — CBC WITH DIFFERENTIAL/PLATELET
BAND NEUTROPHILS: 4 %
Basophils Absolute: 0 10*3/uL (ref 0.0–0.1)
Basophils Relative: 0 %
Eosinophils Absolute: 0 10*3/uL (ref 0.0–0.7)
Eosinophils Relative: 0 %
HCT: 27.4 % — ABNORMAL LOW (ref 39.0–52.0)
Hemoglobin: 8.9 g/dL — ABNORMAL LOW (ref 13.0–17.0)
LYMPHS PCT: 2 %
Lymphs Abs: 2.4 10*3/uL (ref 0.7–4.0)
MCH: 23.6 pg — AB (ref 26.0–34.0)
MCHC: 32.5 g/dL (ref 30.0–36.0)
MCV: 72.7 fL — AB (ref 78.0–100.0)
METAMYELOCYTES PCT: 2 %
MONOS PCT: 4 %
Monocytes Absolute: 4.7 10*3/uL — ABNORMAL HIGH (ref 0.1–1.0)
Myelocytes: 3 %
Neutro Abs: 111 10*3/uL — ABNORMAL HIGH (ref 1.7–7.7)
Neutrophils Relative %: 85 %
PLATELETS: 360 10*3/uL (ref 150–400)
RBC: 3.77 MIL/uL — AB (ref 4.22–5.81)
RDW: 19 % — ABNORMAL HIGH (ref 11.5–15.5)
WBC: 118.1 10*3/uL (ref 4.0–10.5)

## 2017-11-12 LAB — BASIC METABOLIC PANEL
Anion gap: 13 (ref 5–15)
BUN: 43 mg/dL — AB (ref 6–20)
CHLORIDE: 102 mmol/L (ref 98–111)
CO2: 22 mmol/L (ref 22–32)
Calcium: 9.9 mg/dL (ref 8.9–10.3)
Creatinine, Ser: 1.98 mg/dL — ABNORMAL HIGH (ref 0.61–1.24)
GFR calc Af Amer: 47 mL/min — ABNORMAL LOW (ref >60)
GFR calc non Af Amer: 40 mL/min — ABNORMAL LOW (ref >60)
GLUCOSE: 108 mg/dL — AB (ref 70–99)
Potassium: 3.8 mmol/L (ref 3.5–5.1)
Sodium: 137 mmol/L (ref 135–145)

## 2017-11-12 MED ORDER — SODIUM CHLORIDE 0.9 % IV SOLN
INTRAVENOUS | Status: DC | PRN
  Administered 2017-11-12: 250 mL via INTRAVENOUS

## 2017-11-12 NOTE — Progress Notes (Signed)
Daily Progress Note   Patient Name: Ronald Wilson       Date: 11/12/2017 DOB: 05/01/1975  Age: 42 y.o. MRN#: [id number] Attending Physician: Shelly Coss, MD Primary Care Physician: [name], FNP Admit Date: 10/31/2017  Reason for Consultation/Follow-up: Establishing goals of care  Subjective: I met again today with Ronald Wilson.    We discussed again about his current condition and that he has incurable illness with infection that will likely be terminal event.  Discussed that even if antibiotics are effective, underlying wounds will not heal, infection will recur, and there is no "fix" for this.    I discussed again with him regarding recommendation for residential hospice.  He reports that this is still something that he is considering, but he is not agreeable at this point.  He reports that he did tell his father that he is thinking he will probably go to [facility].  When asked if he shared prognosis with his father, he reports that his dad "knows about that place and what happens there."  I talked with his about concern that he may lose ability to make his own decisions (WBC climbing, Cr increasing) and that in that event, majority of his children and father would make decisions on his behalf.  He reports that he wants his father to serve as surrogate if he cannot make his own decisions.  Discussed completion of HCPOA document, and he is agreeable to this.  Length of Stay: 4  Current Medications: Scheduled Meds:  . alteplase  2 mg Intracatheter Once  . amLODipine  10 mg Oral Daily  . enoxaparin (LOVENOX) injection  40 mg Subcutaneous QHS  . morphine  30 mg Oral Q12H    Continuous Infusions: . piperacillin-tazobactam (ZOSYN)  IV 3.375 g (11/12/17 1304)  .  vancomycin 1,250 mg (11/11/17 2124)    PRN Meds: acetaminophen **OR** acetaminophen, alum & mag hydroxide-simeth, HYDROmorphone (DILAUDID) injection, ondansetron **OR** ondansetron (ZOFRAN) IV, oxyCODONE-acetaminophen **AND** oxyCODONE, prochlorperazine, senna-docusate, sodium chloride flush  Physical Exam         General: Alert, awake, in no acute distress.  HEENT: No bruits, no goiter, no JVD Heart: Tachycardic. No murmur appreciated. Lungs: Good air movement, clear Abdomen: Soft, nontender, nondistended, positive bowel sounds.  Skin: Warm and dry Neuro: Grossly intact, nonfocal.  Vital Signs: BP  123/81 (BP Location: Left Arm)   Pulse (!) 131   Temp (!) 97.5 F (36.4 C) (Oral)   Resp 18   Ht 5' 9"  (1.753 m)   Wt 83.9 kg (185 lb)   SpO2 96%   BMI 27.32 kg/m  SpO2: SpO2: 96 % O2 Device: O2 Device: Room Air O2 Flow Rate:    Intake/output summary:   Intake/Output Summary (Last 24 hours) at 11/12/2017 1722 Last data filed at 11/12/2017 1303 Gross per 24 hour  Intake 2276.95 ml  Output 100 ml  Net 2176.95 ml   LBM: Last BM Date: 11/02/17 Baseline Weight: Weight: 83.9 kg (185 lb) Most recent weight: Weight: 83.9 kg (185 lb)       Palliative Assessment/Data:      Patient Active Problem List   Diagnosis Date Noted  . Leukemoid reaction 11/11/2017  . Hypercalcemia of malignancy 11/11/2017  . Leukocytosis 10/24/2017  . Lactic acidosis 10/17/2017  . Port-A-Cath in place 07/19/2017  . Wound, open 06/28/2017  . Palliative care by specialist   . Penile cancer ([place]) 06/23/2017  . Sepsis secondary to UTI ([place]) 06/23/2017  . Anemia associated with chemotherapy 06/23/2017  . DVT (deep venous thrombosis) ([place]) 06/23/2017  . Sepsis ([place]) 06/23/2017  . Goals of care, counseling/discussion 12/16/2016  . Penis cancer ([place]) 12/16/2016  . Inguinal lymphadenopathy   . Inguinal mass 11/17/2016  . 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 Anemia 11/17/2016  . Unilateral edema of left  lower extremity 11/17/2016  . Tobacco use disorder 11/17/2016    Palliative Care Assessment & Plan   Patient Profile: 42 y.o. 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  He is not a candidate for further disease modifying therapy and has been asking about hospice support.  Palliative consulted for goals of care.   Assessment: Patient Active Problem List   Diagnosis Date Noted  . Leukemoid reaction 11/11/2017  . Hypercalcemia of malignancy 12/08/2017  . Leukocytosis 10/13/2017  . Lactic acidosis 10/25/2017  . Port-A-Cath in place 07/19/2017  . Wound, open 06/28/2017  . Palliative care by specialist   . Penile cancer ([place]) 06/23/2017  . Sepsis secondary to UTI ([place]) 06/23/2017  . Anemia associated with chemotherapy 06/23/2017  . DVT (deep venous thrombosis) ([place]) 06/23/2017  . Sepsis ([place]) 06/23/2017  . Goals of care, counseling/discussion 12/16/2016  . Penis cancer ([place]) 12/16/2016  . Inguinal lymphadenopathy   . Inguinal mass 11/17/2016  . 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 Anemia 11/17/2016  . Unilateral edema of left lower extremity 11/17/2016  . Tobacco use disorder 11/17/2016   Recommendations/Plan:  DNR/DNI  I spoke again at length with Ronald Wilson today.  We discussed again about residential hospice and he maintains that he wants to continue with current interventions "for now" but he has also been discussing this with his father now.  He remains adamant today that I not speak with family but reports that he has shared information now with his father.  I shared with him my concern that he is going to lose the ability to make his own decisions.  He reports having 7 children (oldest 55, youngest 17, 2 of them are legal adults- 17 and 20 respectively).  Informed him that in the event he is not able to make his own decisions, his adult children and and father will be asked to make decisions on his behalf.     I am concerned he will  continue to decline and we are going to end up at a point where he is no longer decisional and we are forced to look to family to make decisions on his behalf.  He reports telling his father about what is going on last night.  He tells me today that he would like his father (not his children) to be sole surrogate Media planner.  Will request spiritual care to complete advance directive.   Code Status:    Code Status Orders  (From admission, onward)        Start     Ordered   11/04/2017 2253  Do not attempt resuscitation (DNR)  Continuous    Question Answer Comment  In the event of cardiac or respiratory ARREST Do not call a "code blue"   In the event of cardiac or respiratory ARREST Do not perform Intubation, CPR, defibrillation or ACLS   In the event of cardiac or respiratory ARREST Use medication by any route, position, wound care, and other measures to relive pain and suffering. May use oxygen, suction and manual treatment of airway obstruction as needed for comfort.      11/01/2017 2252    Code Status History    Date Active Date Inactive Code Status Order ID Comments User Context   10/15/2017 2218 10/15/2017 2252 DNR [id number]  Daleen Bo, MD ED   06/22/2017 2120 06/28/2017 1801 Full Code [id number]  Hosie Poisson, MD Inpatient   11/17/2016 2218 11/23/2016 1515 Full Code [id number]  Reubin Milan, MD Inpatient    Advance Directive Documentation     Most Recent Value  Type of Advance Directive  Healthcare Power of Attorney  Pre-existing out of facility DNR order (yellow form or pink MOST form)  -  "MOST" Form in Place?  -       Prognosis:   Guarded  Discharge Planning:  To Be Determined  Care plan was discussed with Patient, RN  Thank you for allowing the Palliative Medicine Team to assist in the care of this patient.   Total Time 45 Prolonged Time Billed Yes      Greater than 50%  of this time was spent counseling and coordinating care  related to the above assessment and plan.  Micheline Rough, MD  Please contact Palliative Medicine Team phone at [phone] for questions and concerns.

## 2017-11-12 NOTE — Progress Notes (Signed)
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  Patient currently being managed for possible sepsis secondary to penile lesions.  Palliative care following.  Plan is to possibly discharge him to residential hospice.  Patient has not still decided and wants to take some time.  Assessment & Plan:   Principal Problem:   Sepsis ([place]) Active Problems:   Inguinal mass   Ulcer of penis   Anemia   Penile cancer (H[facility])   Wound, open   Port-A-Cath in place   Leukocytosis   Lactic acidosis   Hypercalcemia of malignancy   Leukemoid reaction  Sepsis: Presented with leukocytosis, tachycardia, fever, lactic acidosis.  Most likely source is  infected penile ulcers.  Continue IV fluids.  We will follow-up blood cultures. NGTD. Continue broad-spectrum antibiotics.Urine culture showed Corynebacterium species most likely contamination.  Leukocytosis: Severe.  Likely leukemoid reaction .white cell counts trending up.  Metastatic squamous cell carcinoma of the penis:  Metastatic disease including pulmonary nodules and diffuse lymphadenopathy. He was diagnosed in 2018. S/P cisplatin and 5-FU palliative chemotherapy. He completed 3 cycles in November 2018 and developed progression of disease. He also had radiation therapy between April 05, 2017 and April 20, 2017. He received 30 Gy in 10 fractions to the pelvis and the periaortic areas.  On 10/19/2017 he had a follow up with Dr. Osker Mason his oncologist at which visit they decided to forego further therapy for the time being and consider Hospice. Currently patient is for   residential hospice.  He says hospice at home is not possible because of his elderly father who cannot take care of him.Palliative care assisting on this.  I have also consulted social worker for contacting residential hospice. He says he will call his family and make his decision in 1 to 2 days about residential hospice.  Penile ulcers/metastatic lesions/penile swelling: Urology evaluated the  patient.  No intervention planned.  Wound care also following  Hypercalcemia of malignancy: We will continue IV fluids.  Started on calcitonin. Given a dose of Zometa.  Calcium level improving.  Severe leukocytosis: Most likely history of malignancy and sepsis.  Continue antibiotics.  We will continue monitor the WBC trend. Follow-up cultures.  Acute kidney injury: Most likely secondary to metastasis of penile cancer to the urinary system.  We will continue to monitor kidney function.  Kidney function continues to deteriorate.IV fluids D/Ced  Anemia: Most likely associated with malignancy.  We will continue to monitor his CBC  Sinus tachycardia: Most likely secondary to pain.  Continue  pain medications.  DVT prophylaxis: Lovenox Code Status: DNR Family Communication: None present at the bedside Disposition Plan: Residential hospice  Consultants: Urology, palliative care  Procedures: None  Antimicrobials: Vancomycin and Zosyn  Subjective: Patient seen and examined the bedside this morning.  Remains tachycardic.  Feels weak and complains of pain on the penile area.   I again discussed about his opinion on going to residential hospice.  He says that it might be the best decision now but he will take 1 or 2 days more until he discusses with his family , friends and makes himself prepared.  Objective: Vitals:   11/11/17 1400 11/11/17  1635 11/11/17 2009 11/12/17 0517  BP: 118/66  (!) 141/96 126/84  Pulse: (!) 133  (!) 140 (!) 135  Resp: 20  19 19   Temp: 97.7 F (36.5 C)  98.2 F (36.8 C) 97.7  F (36.5 C)  TempSrc: Oral  Oral Oral  SpO2: 100% 93% 95% 98%  Weight:      Height:        Intake/Output Summary (Last 24 hours) at 11/12/2017 1143 Last data filed at 11/12/2017 1000 Gross per 24 hour  Intake 3149.11 ml  Output 100 ml  Net 3049.11 ml   Filed Weights   10/14/2017 1909  Weight: 83.9 kg (185 lb)    Examination:  General exam: In  moderate distress due to pain, average built,chronically ill HEENT:PERRL,Oral mucosa moist, Ear/Nose normal on gross exam Respiratory system: Bilateral equal air entry, normal vesicular breath sounds, no wheezes or crackles  Cardiovascular system: Sinus tachycardia, No JVD, murmurs, rubs, gallops or clicks. No pedal edema. Gastrointestinal system: Abdomen is nondistended, soft and nontender. No organomegaly or masses felt. Normal bowel sounds heard. Central nervous system: Alert and oriented. No focal neurological deficits. Extremities: No edema, no clubbing ,no cyanosis, distal peripheral pulses palpable. Skin: No rashes, lesions or ulcers,no icterus ,no pallor MSK: Normal muscle bulk,tone ,power Psychiatry: Judgement and insight appear normal. Mood & affect appropriate.  GU: Multiple penile ulcers, abscesses, nodules, bilateral inguinal lymphadenopathy with ulcers,foul smell    Data Reviewed: I have personally reviewed following labs and imaging studies  CBC: Recent Labs  Lab 10/16/2017 2020 11/13/2017 0453 11/10/17 0840 11/11/17 0542 11/12/17 0327  WBC 51.6* 61.3* 76.3* 89.0* 118.1*  NEUTROABS 48.8*  --  74.7* 86.3* 111.0*  HGB 9.3* 8.7* 9.0* 9.0* 8.9*  HCT 29.9* 26.8* 27.4* 28.1* 27.4*  MCV 72.6* 71.5* 71.9* 73.9* 72.7*  PLT 361 469* 395 363 585   Basic Metabolic Panel: Recent Labs  Lab 11/01/2017 2020 12/04/2017 0453 11/10/17 0840 11/11/17 0542 11/12/17 0327  NA 136 135 137 135 137  K 4.9 4.6 4.7 4.0 3.8  CL 97* 101 102 101 102  CO2 29 28 27 22 22   GLUCOSE 123* 118* 96 108* 108*  BUN 30* 28* 25* 28* 43*  CREATININE  1.89* 1.74* 1.63* 1.60* 1.98*  CALCIUM >15.0* >15.0* 13.0* 11.4* 9.9   GFR: Estimated Creatinine Clearance: 49.1 mL/min (A) (by C-G formula based on SCr of 1.98 mg/dL (H)). Liver Function Tests: Recent Labs  Lab 10/19/2017 2020  AST 16  ALT 11  ALKPHOS 161*  BILITOT 0.2*  PROT 7.6  ALBUMIN 2.8*   No results for input(s): LIPASE, AMYLASE in the last 168 hours. No results for input(s): AMMONIA in the last 168 hours. Coagulation Profile: Recent Labs  Lab 10/21/2017 2020  INR 0.99   Cardiac Enzymes: No results for input(s): CKTOTAL, CKMB, CKMBINDEX, TROPONINI in the last 168 hours. BNP (last 3 results) No results for input(s): PROBNP in the last 8760 hours. HbA1C: No results for input(s): HGBA1C in the last 72 hours. CBG: No results for input(s): GLUCAP in the last 168 hours. Lipid Profile: No results for input(s): CHOL, HDL, LDLCALC, TRIG, CHOLHDL, LDLDIRECT in the last 72 hours. Thyroid Function Tests: No results for input(s): TSH, T4TOTAL, FREET4, T3FREE, THYROIDAB in the last 72 hours. Anemia Panel: No results for input(s): VITAMINB12, FOLATE, FERRITIN, TIBC, IRON, RETICCTPCT in the last 72 hours. Sepsis Labs: Recent Labs  Lab 11/02/2017 2033 10/25/2017 2307  LATICACIDVEN 2.37* 1.65    Recent Results (from the past 240  hour(s))  Urine culture     Status: Abnormal   Collection Time: 11/06/2017  7:45 PM  Result Value Ref Range Status   Specimen Description   Final    URINE, CLEAN CATCH Performed at [facility], [place] [address], [place], [place] [address]    Special Requests   Final    NONE Performed at [facility], [place] [address] [place], [place] [address]    Culture (A)  Final    >=100,000 COLONIES/mL CORYNEBACTERIUM SPECIES Standardized susceptibility testing for this organism is not available. Performed at [facility], [place] [address], [place], [place] [address]    Report Status 11/11/2017 FINAL  Final  Culture,  blood (Routine x 2)     Status: None (Preliminary result)   Collection Time: 10/30/2017  8:42 PM  Result Value Ref Range Status   Specimen Description   Final    BLOOD PORTA CATH Performed at [facility] [address], [place], [place] [address]    Special Requests   Final    BOTTLES DRAWN AEROBIC AND ANAEROBIC Blood Culture adequate volume Performed at [place] [address] [place], [place] [address]    Culture   Final    NO GROWTH 2 DAYS Performed at [place] [address] [place], [place] [address]    Report Status PENDING  Incomplete  Culture, blood (Routine x 2)     Status: None (Preliminary result)   Collection Time: 10/15/2017  8:51 PM  Result Value Ref Range Status   Specimen Description   Final    BLOOD LEFT HAND Performed at [place] [place] [address], [place], [place] [address]    Special Requests   Final    BOTTLES DRAWN AEROBIC AND ANAEROBIC Blood Culture adequate volume Performed at [place] [address], [place], [place] [address]    Culture   Final    NO GROWTH 2 DAYS Performed at [place] [address], [place], [place] [address]    Report Status PENDING  Incomplete  MRSA PCR Screening     Status: None   Collection Time: 11/16/2017 10:22 AM  Result Value Ref Range Status   MRSA by PCR NEGATIVE NEGATIVE Final    Comment:        The GeneXpert MRSA Assay (FDA approved for NASAL specimens only), is one component of a comprehensive MRSA colonization surveillance program. It is not intended to diagnose MRSA infection nor to guide or monitor treatment for MRSA infections. Performed at [facility], [place] [address], [place], [place] [address]          Radiology Studies: No results found.      Scheduled Meds: . alteplase  2 mg Intracatheter Once  . amLODipine  10 mg Oral Daily  . enoxaparin (LOVENOX) injection  40  mg Subcutaneous QHS  . morphine  30 mg Oral Q12H   Continuous Infusions: . sodium chloride 75 mL/hr at 11/12/17 0605  . piperacillin-tazobactam (ZOSYN)  IV Stopped (11/12/17 0933)  . vancomycin 1,250 mg (11/11/17 2124)     LOS: 4 days    Time spent: 25  mins.More than 50% of that time was spent in counseling and/or coordination of care.      Shelly Coss, MD [facility] Pager [phone]  If 7PM-7AM, please contact night-coverage [email or web address] Password [email or web address] 11/12/2017, 11:43 AM

## 2017-11-12 NOTE — Progress Notes (Addendum)
LCSW consulted for residential hospice.   Per palliative note patient is declining hospice at this time and would like to continue aggressive care.   LCSW signing off. No CSW needs at this time. Please submit new consult if CSW needs arise.   Carolin Coy [place]

## 2017-11-12 NOTE — Progress Notes (Signed)
Patient refused dressing change today.  

## 2017-11-13 LAB — CBC WITH DIFFERENTIAL/PLATELET
BASOS ABS: 0 10*3/uL (ref 0.0–0.1)
BLASTS: 0 %
Band Neutrophils: 0 %
Basophils Relative: 0 %
Eosinophils Absolute: 0 10*3/uL (ref 0.0–0.7)
Eosinophils Relative: 0 %
HEMATOCRIT: 25.3 % — AB (ref 39.0–52.0)
HEMOGLOBIN: 8.5 g/dL — AB (ref 13.0–17.0)
Lymphocytes Relative: 2 %
Lymphs Abs: 3.1 10*3/uL (ref 0.7–4.0)
MCH: 23.9 pg — ABNORMAL LOW (ref 26.0–34.0)
MCHC: 33.6 g/dL (ref 30.0–36.0)
MCV: 71.3 fL — ABNORMAL LOW (ref 78.0–100.0)
METAMYELOCYTES PCT: 0 %
MYELOCYTES: 1 %
Monocytes Absolute: 3.1 10*3/uL — ABNORMAL HIGH (ref 0.1–1.0)
Monocytes Relative: 2 %
Neutro Abs: 147.7 10*3/uL — ABNORMAL HIGH (ref 1.7–7.7)
Neutrophils Relative %: 95 %
Other: 0 %
PROMYELOCYTES RELATIVE: 0 %
Platelets: 366 10*3/uL (ref 150–400)
RBC: 3.55 MIL/uL — AB (ref 4.22–5.81)
RDW: 19.1 % — ABNORMAL HIGH (ref 11.5–15.5)
WBC: 153.9 10*3/uL — AB (ref 4.0–10.5)
nRBC: 2 /100 WBC — ABNORMAL HIGH

## 2017-11-13 LAB — BASIC METABOLIC PANEL
ANION GAP: 16 — AB (ref 5–15)
BUN: 63 mg/dL — ABNORMAL HIGH (ref 6–20)
CHLORIDE: 95 mmol/L — AB (ref 98–111)
CO2: 21 mmol/L — AB (ref 22–32)
Calcium: 10.3 mg/dL (ref 8.9–10.3)
Creatinine, Ser: 3.49 mg/dL — ABNORMAL HIGH (ref 0.61–1.24)
GFR calc non Af Amer: 20 mL/min — ABNORMAL LOW (ref >60)
GFR, EST AFRICAN AMERICAN: 23 mL/min — AB (ref >60)
Glucose, Bld: 108 mg/dL — ABNORMAL HIGH (ref 70–99)
Potassium: 4.1 mmol/L (ref 3.5–5.1)
SODIUM: 132 mmol/L — AB (ref 135–145)

## 2017-11-13 LAB — LACTIC ACID, PLASMA: LACTIC ACID, VENOUS: 3 mmol/L — AB (ref 0.5–1.9)

## 2017-11-13 MED ORDER — ZOLPIDEM TARTRATE 5 MG PO TABS
5.0000 mg | ORAL_TABLET | Freq: Once | ORAL | Status: AC
  Administered 2017-11-13: 5 mg via ORAL
  Filled 2017-11-13: qty 1

## 2017-11-13 MED ORDER — CLINDAMYCIN PHOSPHATE 600 MG/50ML IV SOLN
600.0000 mg | Freq: Three times a day (TID) | INTRAVENOUS | Status: DC
  Administered 2017-11-13 – 2017-11-14 (×3): 600 mg via INTRAVENOUS
  Filled 2017-11-13 (×4): qty 50

## 2017-11-13 MED ORDER — SODIUM CHLORIDE 0.9 % IV SOLN: INTRAVENOUS | Status: DC

## 2017-11-13 MED ORDER — HEPARIN SODIUM (PORCINE) 5000 UNIT/ML IJ SOLN: 5000.0000 [IU] | Freq: Three times a day (TID) | INTRAMUSCULAR | Status: DC

## 2017-11-13 NOTE — Progress Notes (Signed)
[facility])  Received request from [place] for patient interest in [facility]. Chart reviewed and met with patient.  He confirmed interest in [facility] but said he prefers to speak with his family today and make decision tomorrow. He kept drifting off to sleep but was able to maintain conversation. He said he prefers to sign his own paper work. Agreed to follow up with him tomorrow for possible transfer to [facility]. He was able to provide his income and discuss physician coverage at [facility]. Updated CSW and Dr. Domingo Cocking. Will follow up Tuesday morning.   Thank you,  Erling Conte, LCSW [phone]

## 2017-11-13 NOTE — Progress Notes (Signed)
CRITICAL VALUE ALERT  Critical Value:  Lactic Acid- 3.0  Date & Time Notied:  8/5 1134  Provider Notified: MD notified via Amnion   Orders Received/Actions taken: Awaiting orders to be carried out

## 2017-11-13 NOTE — Progress Notes (Signed)
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  Patient currently being managed for possible sepsis secondary to penile lesions.  Palliative care following.  Plan is to discharge him to residential hospice.  Hospice was already consulted today but he was hesitant to sign and wants to think about his decision.  Assessment & Plan:   Principal Problem:   Sepsis ([place]) Active Problems:   Inguinal mass   Ulcer of penis   Anemia   Penile cancer (HCC)   Wound, open   Port-A-Cath in place   Leukocytosis   Lactic acidosis   Hypercalcemia of malignancy   Leukemoid reaction  Sepsis: Presented with leukocytosis, tachycardia, fever, lactic acidosis.  Most likely source is  infected penile ulcers. Blood cultures no growth till date.  Urine culture showed Corynebacterium species most likely contamination.  Antibiotics changed to clindamycin due to worsening kidney function.  Patient also has elevated lactic acid.  We will restart him on gentle IV fluids.  Leukocytosis: Severe.  Likely leukemoid reaction .white cell counts trending up.  Metastatic squamous cell carcinoma of the penis:  Metastatic disease including pulmonary nodules and diffuse lymphadenopathy. He was diagnosed in 2018. S/P cisplatin and 5-FU palliative chemotherapy. He completed 3 cycles in November 2018 and developed progression of disease. He also had radiation therapy between April 05, 2017 and April 20, 2017. He received 30 Gy in 10 fractions to the pelvis and the periaortic areas.  On 10/19/2017 he had a follow up with Dr. Osker Mason his oncologist  at which visit they decided to forego further therapy for the time being and consider Hospice. Currently patient is for  residential hospice.  He says hospice at home is not possible because of his elderly father who cannot take care of him.Palliative care assisting on this.  I have also consulted social worker for contacting residential hospice. He still has not made clear decision on residential hospice.  Penile ulcers/metastatic lesions/penile swelling: Urology evaluated the  patient.  No intervention planned.  Wound care also following  Hypercalcemia of malignancy: We will continue IV fluids.  Started on calcitonin. Given a dose of Zometa.  Calcium level improving.  Acute kidney injury: Most likely secondary to metastasis of penile cancer to the urinary system and also sepsis/dehydration.  We will continue to monitor kidney function.  Kidney function continues to deteriorate.IV fluids will be resumed.  But patient might have a some degree of urinary obstruction secondary to the metastatic infiltration.  Anemia: Most likely associated with malignancy.  We will continue to monitor his CBC  Sinus tachycardia: Most likely secondary to pain.  Continue  pain medications.  DVT prophylaxis: Lovenox Code Status: DNR Family Communication: None present at the bedside Disposition Plan: Residential hospice as soon as possible  Consultants: Urology, palliative care  Procedures: None  Antimicrobials: Vancomycin and Zosyn  Subjective: Patient seen and examined the bedside this morning.  Remains tachycardic.  Feels weak and complains of pain on the penile area.   I again discussed about his opinion on going to residential hospice.  He told that he is agreeable on that today but later on changed mind.  We have already requested for  hospice evaluation.  Patient continues to deteriorate.  He might be slightly confused now.  Anticipate rapid worsening.   Objective: Vitals:   11/12/17 1145 11/12/17  1345 11/12/17 2043 11/13/17 0453  BP:  123/81 136/83 128/77  Pulse:  (!) 131 (!) 133 (!) 130  Resp:  18 19 18   Temp:  (!) 97.5 F (36.4 C) 98.4 F (36.9 C) 97.8 F (36.6 C)  TempSrc:  Oral Oral Oral  SpO2: 95% 96% 96% 97%  Weight:      Height:        Intake/Output Summary (Last 24 hours) at 11/13/2017 1518 Last data filed at 11/13/2017 1500 Gross per 24 hour  Intake 1363.72 ml  Output -  Net 1363.72 ml   Filed Weights   11/02/2017 1909  Weight: 83.9 kg (185 lb)    Examination:  General exam: In  moderate distress due to pain, average built,chronically ill HEENT:PERRL,Oral mucosa moist, Ear/Nose normal on gross exam Respiratory system: Bilateral equal air entry, normal vesicular breath sounds, no wheezes or crackles  Cardiovascular system: Sinus tachycardia, No JVD, murmurs, rubs, gallops or clicks. No pedal edema. Gastrointestinal system: Abdomen is nondistended, soft and nontender. No organomegaly or masses felt. Normal bowel sounds heard. Central nervous system: Alert and awake . No focal neurological deficits. Extremities: No edema, no clubbing ,no cyanosis, distal peripheral pulses palpable. Skin: No rashes, lesions or ulcers,no icterus ,no pallor MSK: Normal muscle bulk,tone ,power Psychiatry: Judgement and insight appear normal. Mood & affect appropriate.  GU: Multiple penile ulcers, abscesses, nodules, bilateral inguinal lymphadenopathy with ulcers,foul smell    Data Reviewed: I have personally reviewed following labs and imaging studies  CBC: Recent Labs  Lab 11/04/2017 2020 11/30/2017 0453 11/10/17 0840 11/11/17 0542 11/12/17 0327 11/13/17 0402  WBC 51.6* 61.3* 76.3* 89.0* 118.1* 153.9*  NEUTROABS 48.8*  --  74.7* 86.3* 111.0* 147.7*  HGB 9.3* 8.7* 9.0* 9.0* 8.9* 8.5*  HCT 29.9* 26.8* 27.4* 28.1* 27.4* 25.3*  MCV 72.6* 71.5* 71.9* 73.9* 72.7* 71.3*  PLT 361 469* 395 363 360 035   Basic Metabolic Panel: Recent Labs  Lab 11/12/2017 0453 11/10/17 0840  11/11/17 0542 11/12/17 0327 11/13/17 0402  NA 135 137 135 137 132*  K 4.6 4.7 4.0 3.8 4.1  CL 101 102 101 102 95*  CO2 28 27 22 22  21*  GLUCOSE 118* 96 108* 108* 108*  BUN 28* 25* 28* 43* 63*  CREATININE 1.74* 1.63* 1.60* 1.98* 3.49*  CALCIUM >15.0* 13.0* 11.4* 9.9 10.3   GFR: Estimated Creatinine Clearance: 27.9 mL/min (A) (by C-G formula based on SCr of 3.49 mg/dL (H)). Liver Function Tests: Recent Labs  Lab 10/25/2017 2020  AST 16  ALT 11  ALKPHOS 161*  BILITOT 0.2*  PROT 7.6  ALBUMIN 2.8*   No results for input(s): LIPASE, AMYLASE in the last 168 hours. No results for input(s): AMMONIA in the last 168 hours. Coagulation Profile: Recent Labs  Lab 11/04/2017 2020  INR 0.99   Cardiac Enzymes: No results for input(s): CKTOTAL, CKMB, CKMBINDEX, TROPONINI in the last 168 hours. BNP (last 3 results) No results for input(s): PROBNP in the last 8760 hours. HbA1C: No results for input(s): HGBA1C in the last 72 hours. CBG: No results for input(s): GLUCAP in the last 168 hours. Lipid Profile: No results for input(s): CHOL, HDL, LDLCALC, TRIG, CHOLHDL, LDLDIRECT in the last 72 hours. Thyroid Function Tests: No results for input(s): TSH, T4TOTAL, FREET4, T3FREE, THYROIDAB in the last 72 hours. Anemia Panel: No results for  input(s): VITAMINB12, FOLATE, FERRITIN, TIBC, IRON, RETICCTPCT in the last 72 hours. Sepsis Labs: Recent Labs  Lab 10/21/2017 2033 10/14/2017 2307 11/13/17 1047  LATICACIDVEN 2.37* 1.65 3.0*    Recent Results (from the past 240 hour(s))  Urine culture     Status: Abnormal   Collection Time: 10/22/2017  7:45 PM  Result Value Ref Range Status   Specimen Description   Final    URINE, CLEAN CATCH Performed at [facility] [address], [place], [place] [address]    Special Requests   Final    NONE Performed at [facility], [place] [address], [place], [place] [address]    Culture (A)  Final    >=100,000 COLONIES/mL  CORYNEBACTERIUM SPECIES Standardized susceptibility testing for this organism is not available. Performed at [facility], [place] [address] [place], [place] [address]    Report Status 11/11/2017 FINAL  Final  Culture, blood (Routine x 2)     Status: None (Preliminary result)   Collection Time: 10/09/2017  8:42 PM  Result Value Ref Range Status   Specimen Description   Final    BLOOD PORTA CATH Performed at [facility] [address], [place], [place] [address]    Special Requests   Final    BOTTLES DRAWN AEROBIC AND ANAEROBIC Blood Culture adequate volume Performed at [facility] [address] [place], [place] [address]    Culture   Final    NO GROWTH 4 DAYS Performed at [facility], [place] [address] [place], [place] [address]    Report Status PENDING  Incomplete  Culture, blood (Routine x 2)     Status: None (Preliminary result)   Collection Time: 10/29/2017  8:51 PM  Result Value Ref Range Status   Specimen Description   Final    BLOOD LEFT HAND Performed at [facility] [address] [place], [place] [address]    Special Requests   Final    BOTTLES DRAWN AEROBIC AND ANAEROBIC Blood Culture adequate volume Performed at [facility] [address] [place], [place] [address]    Culture   Final    NO GROWTH 4 DAYS Performed at [facility], [place] [address], [place], [place] [address]    Report Status PENDING  Incomplete  MRSA PCR Screening     Status: None   Collection Time: 11/18/2017 10:22 AM  Result Value Ref Range Status   MRSA by PCR NEGATIVE NEGATIVE Final    Comment:        The GeneXpert MRSA Assay (FDA approved for NASAL specimens only), is one component of a comprehensive MRSA colonization surveillance program. It is not intended to diagnose MRSA infection nor to guide or monitor treatment for MRSA infections. Performed at [facility], [place]  [address] [place], [place] [address]          Radiology Studies: No results found.      Scheduled Meds: . alteplase  2 mg Intracatheter Once  . amLODipine  10 mg Oral Daily  . heparin injection (subcutaneous)  5,000 Units Subcutaneous Q8H  . morphine  30 mg Oral Q12H   Continuous Infusions: . sodium chloride Stopped (11/13/17 1255)  . clindamycin (CLEOCIN) IV Stopped (11/13/17 1345)     LOS: 5 days    Time spent: 25  mins.More than 50% of that time was spent in counseling and/or coordination of care.  Shelly Coss, MD [facility] Pager [phone]  If 7PM-7AM, please contact night-coverage [email or web address] Password [facility] 11/13/2017, 3:18 PM

## 2017-11-13 NOTE — Progress Notes (Signed)
LCSW made referral to residential hospice.  Patient prefers [facility].   LCSW will continue to follow for disposition.   Carolin Coy [place]

## 2017-11-13 NOTE — Progress Notes (Signed)
Daily Progress Note   Patient Name: Ronald Wilson       Date: 11/13/2017 DOB: July 09, 1975  Age: 42 y.o. MRN#: [id number] Attending Physician: Shelly Coss, MD Primary Care Physician: [name], FNP Admit Date: 10/31/2017  Reason for Consultation/Follow-up: Establishing goals of care  Subjective: I met again today with Ronald Wilson in conjunction with Servando Snare, LCSW.    I reviewed with him current clinical course including increasing white count and Cr.  We reviewed his prior conversations that he wants to be the only one to make his medical decisions and my concern that he is likely to loose this ability soon with his worsening clinical state including renal failure.    I discussed again with him regarding recommendation for residential hospice.  He reports today that he understands and would like to transition to [facility] for residential hospice care.  Length of Stay: 5  Current Medications: Scheduled Meds:  . alteplase  2 mg Intracatheter Once  . amLODipine  10 mg Oral Daily  . heparin injection (subcutaneous)  5,000 Units Subcutaneous Q8H  . morphine  30 mg Oral Q12H    Continuous Infusions: . sodium chloride Stopped (11/12/17 2218)  . piperacillin-tazobactam (ZOSYN)  IV 3.375 g (11/13/17 0609)  . vancomycin 166.7 mL/hr at 11/12/17 2300    PRN Meds: sodium chloride, acetaminophen **OR** acetaminophen, alum & mag hydroxide-simeth, HYDROmorphone (DILAUDID) injection, ondansetron **OR** ondansetron (ZOFRAN) IV, oxyCODONE-acetaminophen **AND** oxyCODONE, prochlorperazine, senna-docusate, sodium chloride flush  Physical Exam         General: Alert, awake, in no acute distress.  HEENT: No bruits, no goiter, no JVD Heart: Tachycardic. No murmur  appreciated. Lungs: Good air movement, clear Abdomen: Soft, nontender, nondistended, positive bowel sounds.  Skin: Warm and dry Neuro: Grossly intact, nonfocal.  Vital Signs: BP 128/77 (BP Location: Left Arm)   Pulse (!) 130   Temp 97.8 F (36.6 C) (Oral)   Resp 18   Ht 5' 9"  (1.753 m)   Wt 83.9 kg (185 lb)   SpO2 97%   BMI 27.32 kg/m  SpO2: SpO2: 97 % O2 Device: O2 Device: Room Air O2 Flow Rate:    Intake/output summary:   Intake/Output Summary (Last 24 hours) at 11/13/2017 1059 Last data filed at 11/12/2017 2300 Gross per 24 hour  Intake 1273.13 ml  Output -  Net 1273.13 ml   LBM: Last BM Date: 11/02/17 Baseline Weight: Weight: 83.9 kg (185 lb) Most recent weight: Weight: 83.9 kg (185 lb)       Palliative Assessment/Data:      Patient Active Problem List   Diagnosis Date Noted  . Leukemoid reaction 11/11/2017  . Hypercalcemia of malignancy 11/25/2017  . Leukocytosis 10/18/2017  . Lactic acidosis 10/21/2017  . Port-A-Cath in place 07/19/2017  . Wound, open 06/28/2017  . Palliative care by specialist   . Penile cancer ([place]) 06/23/2017  . Sepsis secondary to UTI ([place]) 06/23/2017  . Anemia associated with chemotherapy 06/23/2017  . DVT (deep venous thrombosis) ([place]) 06/23/2017  . Sepsis ([place]) 06/23/2017  . Goals of care, counseling/discussion 12/16/2016  . Penis cancer ([place]) 12/16/2016  . Inguinal lymphadenopathy   . Inguinal mass 11/17/2016  . 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 Anemia 11/17/2016  . Unilateral edema of left lower extremity 11/17/2016  . Tobacco use disorder 11/17/2016    Palliative Care Assessment & Plan   Patient Profile: 42 y.o. 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  He is not a candidate for further disease modifying therapy and has been asking about hospice support.  Palliative consulted for goals of care.   Assessment: Patient Active Problem List   Diagnosis  Date Noted  . Leukemoid reaction 11/11/2017  . Hypercalcemia of malignancy 11/26/2017  . Leukocytosis 10/18/2017  . Lactic acidosis 10/13/2017  . Port-A-Cath in place 07/19/2017  . Wound, open 06/28/2017  . Palliative care by specialist   . Penile cancer ([place]) 06/23/2017  . Sepsis secondary to UTI ([place]) 06/23/2017  . Anemia associated with chemotherapy 06/23/2017  . DVT (deep venous thrombosis) ([place]) 06/23/2017  . Sepsis ([place]) 06/23/2017  . Goals of care, counseling/discussion 12/16/2016  . Penis cancer ([place]) 12/16/2016  . Inguinal lymphadenopathy   . Inguinal mass 11/17/2016  . 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 Anemia 11/17/2016  . Unilateral edema of left lower extremity 11/17/2016  . Tobacco use disorder 11/17/2016   Recommendations/Plan:  DNR/DNI  I spoke again with Ronald Wilson today in conjunction with social work.  We discussed again about residential hospice and he would like to transition to [facility] for residential hospice care.  Appreciate social work assistance.  Code Status:    Code Status Orders  (From admission, onward)        Start     Ordered   11/07/2017 2253  Do not attempt resuscitation (DNR)  Continuous    Question Answer Comment  In the event of cardiac or respiratory ARREST Do not call a "code blue"   In the event of cardiac or respiratory ARREST Do not perform Intubation, CPR, defibrillation or ACLS   In the event of cardiac or respiratory ARREST Use medication by any route, position, wound care, and other measures to relive pain and suffering. May use oxygen, suction and manual treatment of airway obstruction as needed for comfort.      10/18/2017 2252    Code Status History    Date Active Date Inactive Code Status Order ID Comments User Context   10/28/2017 2218 11/05/2017 2252 DNR [id number]  Daleen Bo, MD ED   06/22/2017 2120 06/28/2017 1801 Full Code [id number]  Hosie Poisson, MD Inpatient   11/17/2016 2218 11/23/2016 1515 Full Code [id number]   Reubin Milan, MD Inpatient    Advance Directive Documentation  Most Recent Value  Type of Advance Directive  Healthcare Power of Attorney  Pre-existing out of facility DNR order (yellow form or pink MOST form)  -  "MOST" Form in Place?  -       Prognosis:   < 2 weeks.  He has climbing WBC, continued clinical decline and now has progressed to renal failure as well.  His prognosis is less than 2 weeks and he will best be served by transition to residential hospice if it can be arranged.  Discharge Planning:  Hospice facility  Care plan was discussed with Patient, RN  Thank you for allowing the Palliative Medicine Team to assist in the care of this patient.   Total Time 35 Prolonged Time Billed No      Greater than 50%  of this time was spent counseling and coordinating care related to the above assessment and plan.  Micheline Rough, MD  Please contact Palliative Medicine Team phone at [phone] for questions and concerns.

## 2017-11-13 NOTE — Clinical Social Work Note (Signed)
Clinical Social Work Assessment  Patient Details  Name: Ronald Wilson MRN: [id number] Date of Birth: 07/23/1975  Date of referral:  11/13/17               Reason for consult:  Facility Placement                Permission sought to share information with:  Chartered certified accountant granted to share information::  Yes, Verbal Permission Granted  Name::        Agency::  [facility]  Relationship::     Contact Information:     Housing/Transportation Living arrangements for the past 2 months:  Single Family Home Source of Information:  Patient, Medical Team Patient Interpreter Needed:  None Criminal Activity/Legal Involvement Pertinent to Current Situation/Hospitalization:  No - Comment as needed Significant Relationships:  Adult Children, Dependent Children, Parents Lives with:  Self Do you feel safe going back to the place where you live?    Need for family participation in patient care:  No (Coment)  Care giving concerns:  Patient is terminally ill and does not want family to know his condition. Patient is at high risk to become confused and not be able to make his own decisions.    Social Worker assessment / plan:  LCSW consulted for residential hospice placement.   At the time of consult patient was not agreeable.   Patient is a 42 y.o. 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   LCSW met at bedside with patient and Palliative MD. No supports present. Palliative MD discussed current labs and prognosis with patient. Patient now agreeable to residential hospice. LCSW explained residential hospice process. Patient prefers [facility].   Patient reports that he has 7 children ranging from age 64-24. Patient has not shared is currently condition with his family. Patient spoke with his father but according to notes he was not detailed about his  situation. Cha[place]in met with patient to complete advance directive paperwork. Patient complained of pain and asked to complete paperwork later.   PLAN: LCSW will make residential hospice referral. Patient to dc to hospice when bed is available.   Employment status:  Disabled (Comment on whether or not currently receiving Disability) Insurance information:  Medicaid [place] PT Recommendations:    Information / Referral to community resources:  Other (Comment Required)(Residential hopsice)  Patient/Family's Response to care:  Unable to access family response. Patient is pleasant and thankful for LCSW visit and dc coordination.   Patient/Family's Understanding of and Emotional Response to Diagnosis, Current Treatment, and Prognosis:  Patient appears to have a clear understanding of his prognosis and is agreeable to residential hospice today after days of contemplating. LCSW unable to determine patients emotional response. Patient has a flat affect.   Emotional Assessment Appearance:  Appears stated age Attitude/Demeanor/Rapport:    Affect (typically observed):  Accepting, Calm, Pleasant Orientation:  Oriented to Self, Oriented to Place, Oriented to  Time, Oriented to Situation Alcohol / Substance use:  Not Applicable Psych involvement (Current and /or in the community):  No (Comment)  Discharge Needs  Concerns to be addressed:  Decision making concerns Readmission within the last 30 days:  No Current discharge risk:  None Barriers to Discharge:  Hospice Bed not available   Servando Snare, LCSW 11/13/2017, 10:14 AM

## 2017-11-13 NOTE — Progress Notes (Signed)
   11/13/17 0900  Clinical Encounter Type  Visited With Patient  Visit Type Initial;Psychological support;Spiritual support  Referral From Nurse  Consult/Referral To Chaplain  Spiritual Encounters  Spiritual Needs Brochure  Stress Factors  Patient Stress Factors Health changes;Major life changes  Advance Directives (For Healthcare)  Does patient want to make changes to medical advance directive? Yes (Inpatient - patient requests chaplain consult to change a medical advance directive)   I visited with the patient per Spiritual Care consult about an Advance Directive naming his father as his Healthcare power of Attorney. The patient was in pain during my visit, and wanted to review the paperwork and finish the paperwork at a later time. I provided brief education about the Advance Directive paperwork.   Please, contact Spiritual Care for further assistance.   Chaplain Shanon Ace M.Div., [facility]

## 2017-11-14 LAB — CULTURE, BLOOD (ROUTINE X 2)
Culture: NO GROWTH
Culture: NO GROWTH
SPECIAL REQUESTS: ADEQUATE
Special Requests: ADEQUATE

## 2017-11-15 ENCOUNTER — Inpatient Hospital Stay: Payer: Medicaid Other

## 2017-11-16 ENCOUNTER — Ambulatory Visit: Payer: Self-pay | Admitting: Oncology

## 2017-12-10 NOTE — Discharge Summary (Signed)
  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 Patient was admitted for  possible sepsis secondary to penile lesions. Patient was started on empiric antibiotics.  Urology was consulted after admission who did not plan for any intervention because he was not a candidate for that.  His overall status continued to deteriorate.  Palliative care was involved for transitioning his care to hospice.  Patient was reluctant to be discharged to residential hospice.  Patient expired on 12/13/17.     Final Diagnoses:  1.   Severe sepsis secondary to penile lesions secondary to squamous cell carcinoma of the penis.   The results of significant diagnostics from this hospitalization (including imaging, microbiology, ancillary and laboratory) are listed below for reference.    Significant Diagnostic Studies: Dg Chest 2 View  Result Date: Dec 07, 2017 CLINICAL DATA:  Sepsis, fever, cancer patient EXAM: CHEST - 2 VIEW COMPARISON:  CT chest dated 10/17/2017 FINDINGS: At least 3 left upper lobe pulmonary nodules, better evaluated on recent CT. Additional pulmonary metastases are not radiographically evident. No focal consolidation.  No pleural effusion or pneumothorax. Heart is normal in size. Right chest power port terminating at the cavoatrial junction. IMPRESSION: At least 3 left upper lobe pulmonary nodules, with additional pulmonary metastases better evaluated on recent CT. No evidence of acute cardiopulmonary disease. Electronically Signed   By: Julian Hy M.D.   On: 12-07-2017 23:40    Microbiology: No results found for this or any previous visit (from the past 240 hour(s)).   Labs: Basic Metabolic Panel: No results for input(s): NA, K, CL, CO2, GLUCOSE, BUN, CREATININE, CALCIUM, MG, PHOS in the last 168 hours. Liver Function Tests: No results for input(s): AST, ALT, ALKPHOS, BILITOT, PROT, ALBUMIN in the last 168 hours. No results for input(s): LIPASE, AMYLASE in the last 168 hours. No results for input(s): AMMONIA in the last 168 hours. CBC: No results for input(s): WBC, NEUTROABS, HGB, HCT, MCV, PLT in the last 168 hours. Cardiac Enzymes: No results for input(s): CKTOTAL, CKMB, CKMBINDEX, TROPONINI in the last 168 hours. D-Dimer No results for input(s): DDIMER in the last 72 hours. BNP: Invalid input(s): POCBNP CBG: No results for input(s): GLUCAP in the last 168 hours. Anemia work up No results for input(s): VITAMINB12, FOLATE, FERRITIN, TIBC, IRON, RETICCTPCT in the last 72 hours. Urinalysis    Component Value Date/Time   COLORURINE YELLOW 12-07-17 1945   APPEARANCEUR CLOUDY (A) 12/07/2017 1945   LABSPEC 1.015 2017/12/07 1945   PHURINE 5.0 December 07, 2017 1945   GLUCOSEU NEGATIVE 12-07-17 1945   HGBUR LARGE (A) 07-Dec-2017 [place] NEGATIVE 12-07-2017 [place] NEGATIVE Dec 07, 2017 1945   PROTEINUR 30 (A) 2017-12-07 1945   NITRITE NEGATIVE 12/07/17 1945   LEUKOCYTESUR LARGE (A) 12/07/17 1945   Sepsis Labs Invalid input(s): PROCALCITONIN,  WBC,  LACTICIDVEN     SIGNED:  Shelly Coss, MD  [facility] 11/23/2017, 2:21 PM Pager [phone]  If 7PM-7AM, please contact night-coverage [email or web address] Password [email or web address]

## 2017-12-10 NOTE — Progress Notes (Signed)
Pt expired at 0540 pronounced by 2 RNs. Unfortunate situation for a young man with metastatic penile cancer who came in with sepsis. Was already hospice at home and was a DNR. Basically, RN found him with shallow breathing and then he passed. Death certificate completed and given to Gershon Mussel, Therapist, sports.  KJKG, NP [facility]

## 2017-12-10 NOTE — Progress Notes (Signed)
Met the daughter of this patient a little later in the day and she shared his death  with me and we talked about her father.  We talked about the visit she and her other siblings had with him on yesterday.  She is very tearful this morning saying she didn't know that he was as sick as he was.  She didn't know he was on Hospice and etc.  Very pleasant visit with Verdene Lennert, this patient's daughter.      26-Nov-2017 1133  Clinical Encounter Type  Visited With Family  Visit Type Initial;Spiritual support

## 2017-12-10 NOTE — Progress Notes (Signed)
Patient expired at 0540 On call/AC notified next of kin(father) notified(left message)at [phone]

## 2017-12-10 DEATH — deceased

## 2019-01-15 IMAGING — CR DG CHEST 2V
2 series · 2 of 2 positions shown · non-contrast
Comparison: CT chest of 05/24/2017

CLINICAL DATA: Fever, just completed chemotherapy, sickle cell
trait

EXAM:
CHEST - 2 VIEW

[w chest pa]
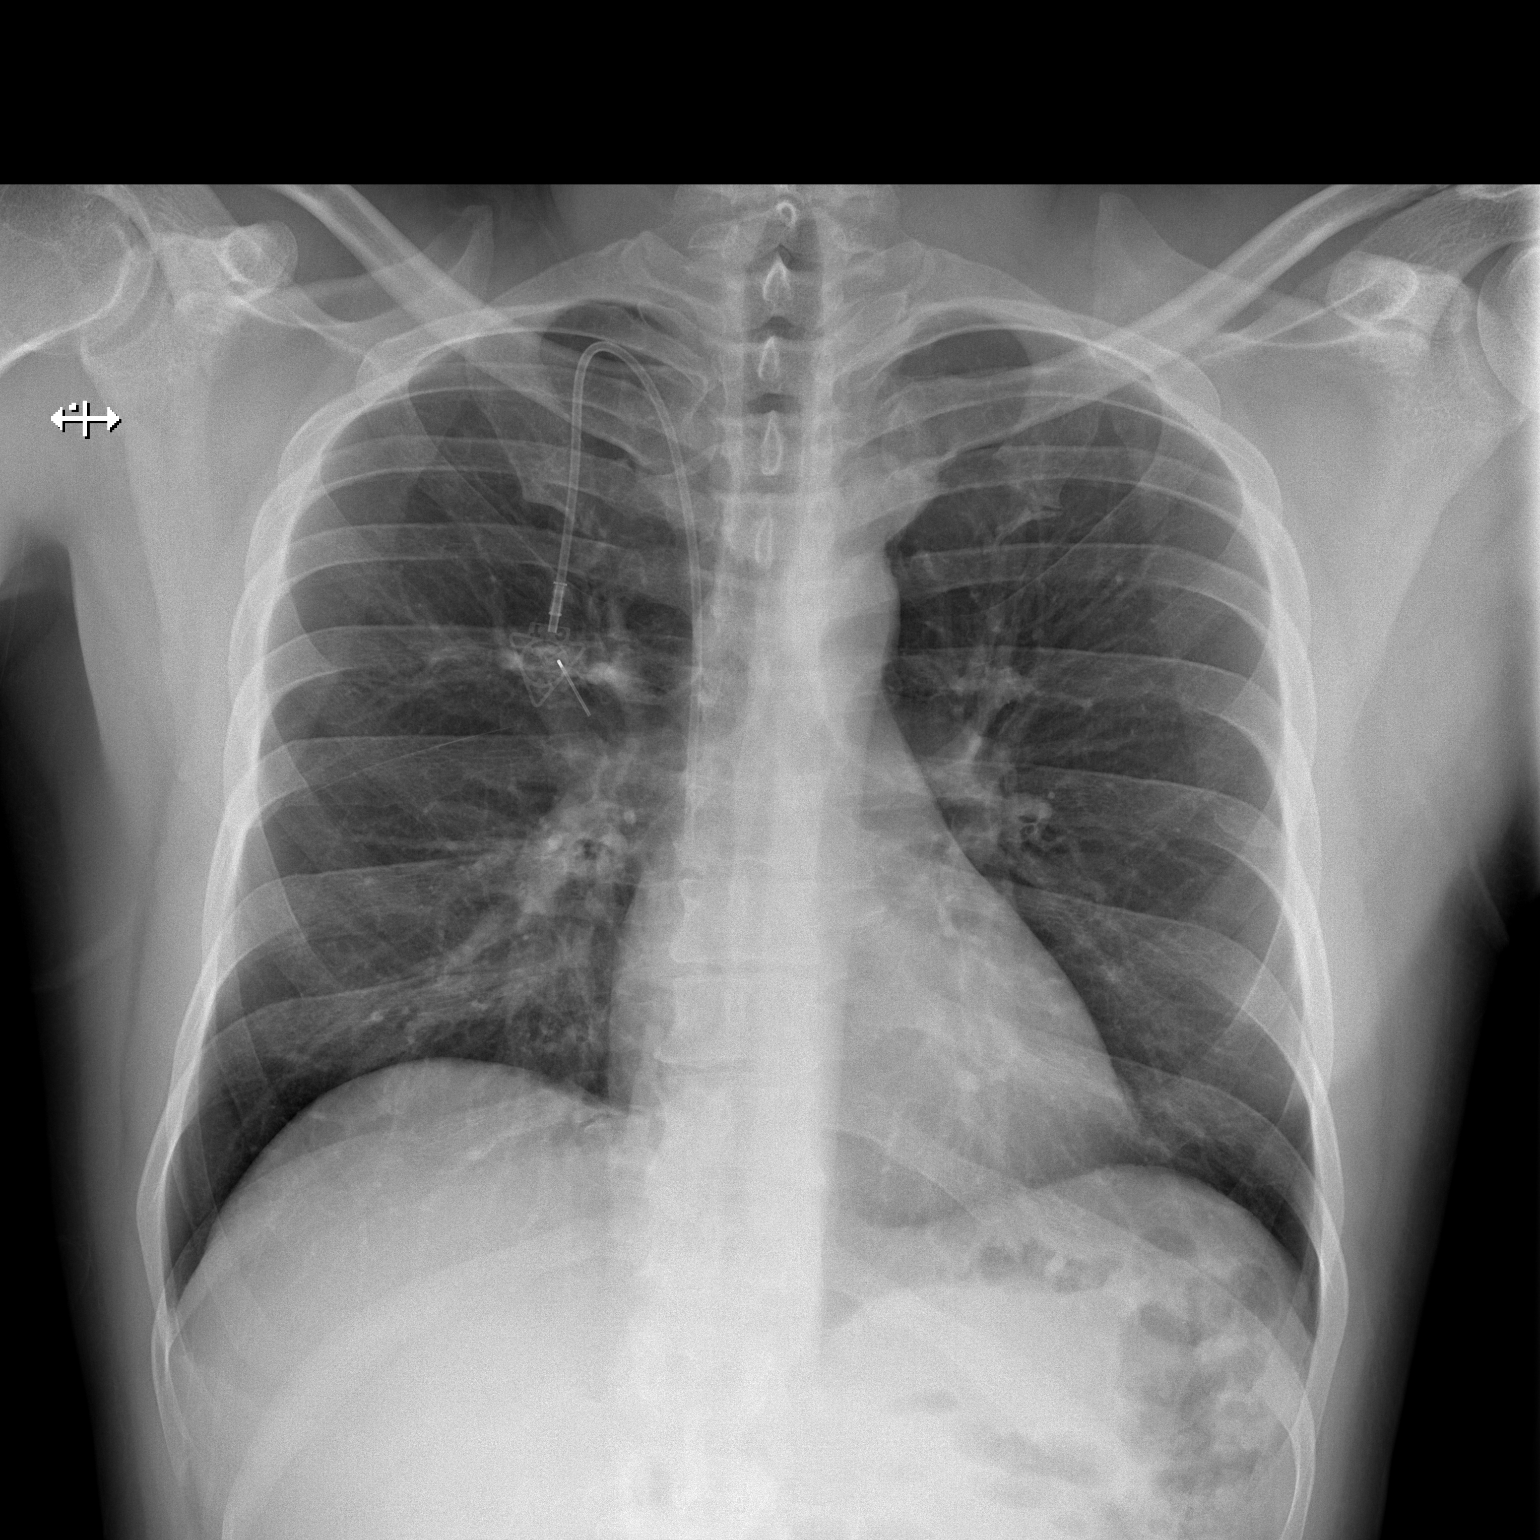

[w chest lat]
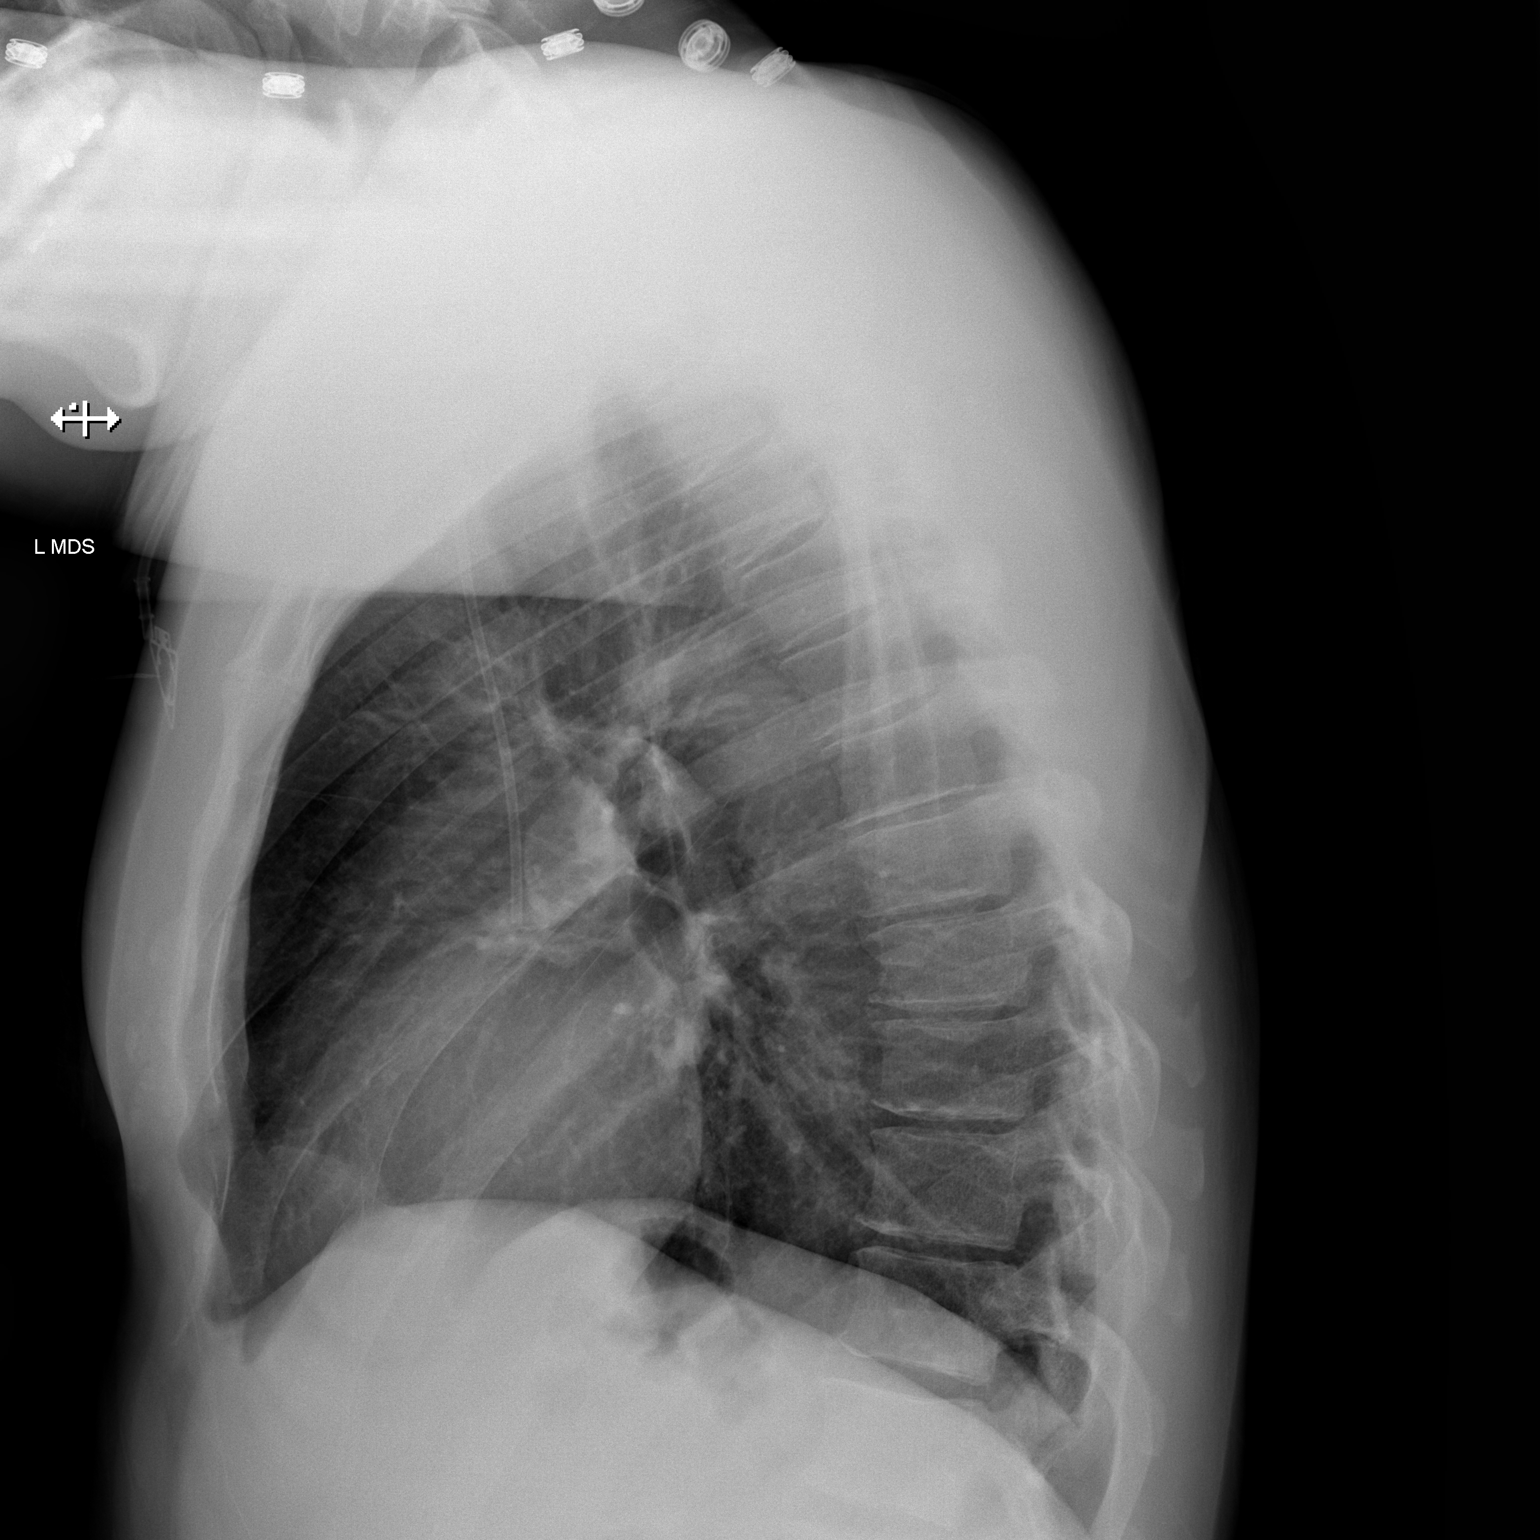

[2 of 2 positions shown; findings below may reference images not displayed]

FINDINGS: No active infiltrate or effusion is seen. Right-sided Port-A-Cath
tip overlies the lower SVC above the expected right atrial junction.
Mediastinal and hilar contours are unremarkable and the heart is
within normal limits in size. No bony abnormality is seen.
IMPRESSION: No active lung disease. Right-sided Port-A-Cath tip overlies the
lower SVC.

## 2019-01-16 IMAGING — CT CT PELVIS W/ CM
2 of 3 series · 16 of 46 positions shown, 18 images · IV contrast (iopamidol)
Comparison: 06/06/2017 CT abdomen and pelvis.

CLINICAL DATA: 41 y/o M; squamous cell penile cancer [DATE] with
chemotherapy completed [DATE] and x-ray therapy completed [DATE].

EXAM:
CT PELVIS WITH CONTRAST
TECHNIQUE: Multidetector CT imaging of the pelvis was performed using the
standard protocol following the bolus administration of intravenous
contrast.
CONTRAST:  75mL [id number] IOPAMIDOL ([id number]) INJECTION 61%

[Series 2: axial st · axial · 0.81mm/px · z∈[-370,-60]mm · 13 of 72 slices shown, 15 images]
[im 5/72  soft-tissue]
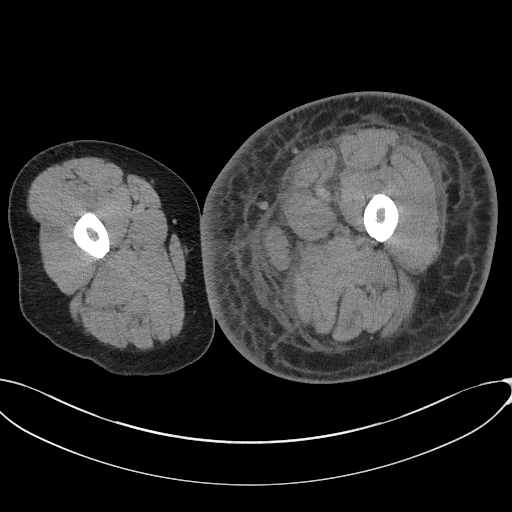
[im 5/72  bone]
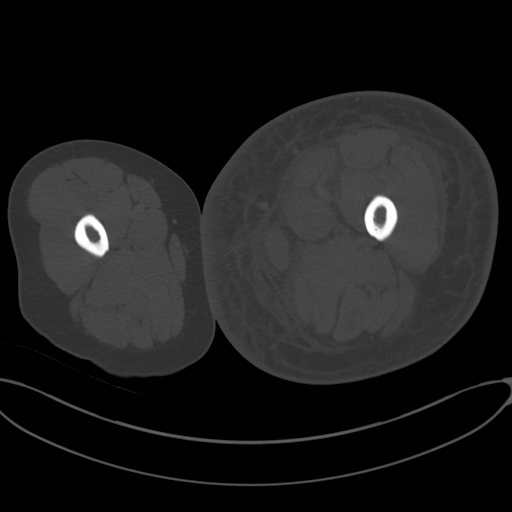
[im 10/72  soft-tissue]
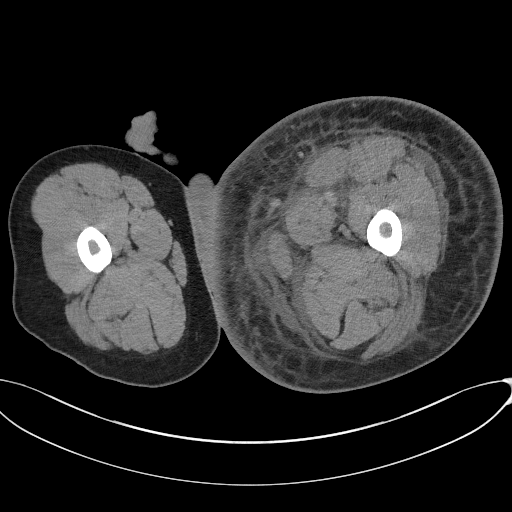
[im 14/72  soft-tissue]
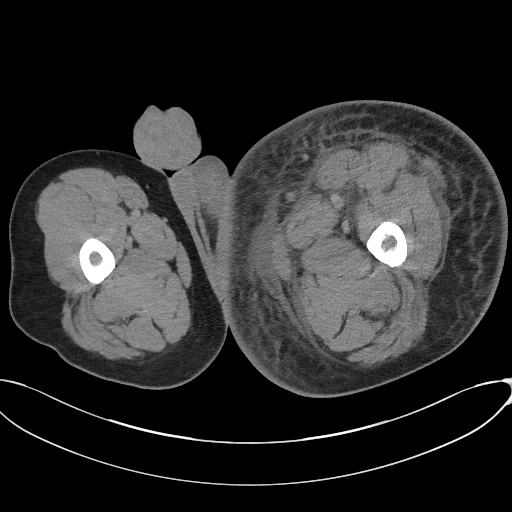
[im 21/72  soft-tissue]
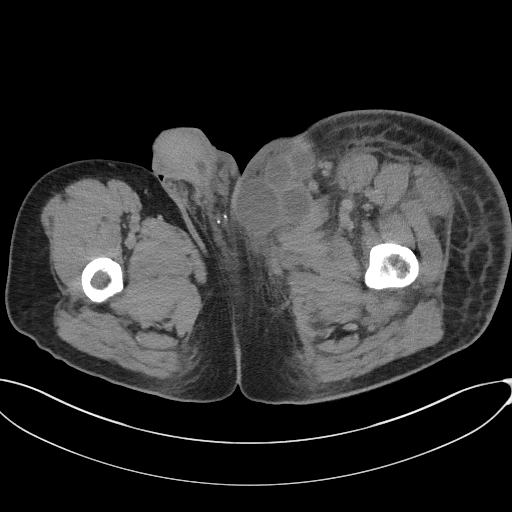
[im 26/72  soft-tissue]
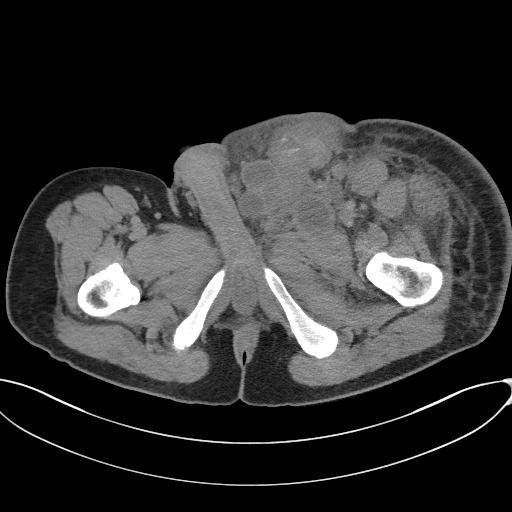
[im 30/72  soft-tissue]
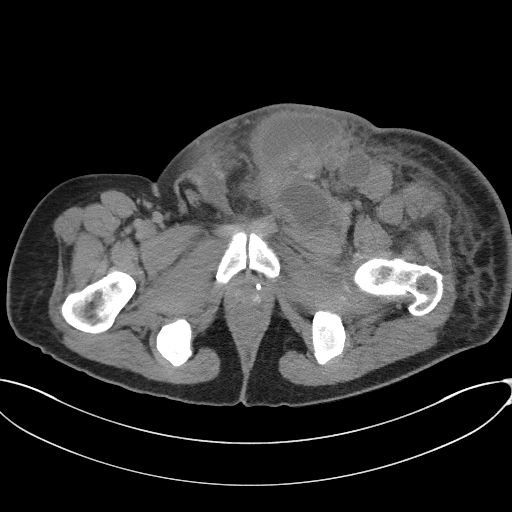
[im 37/72  soft-tissue]
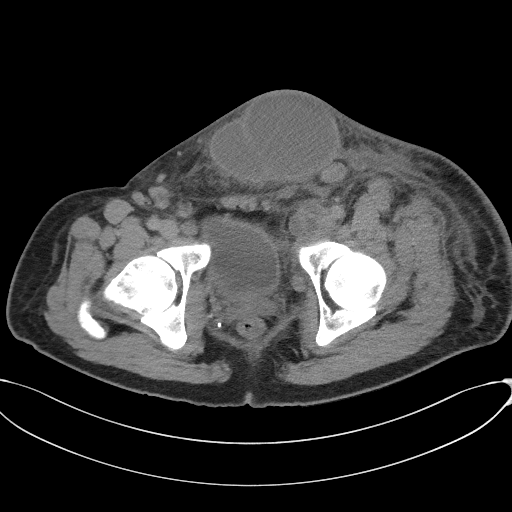
[im 42/72  soft-tissue]
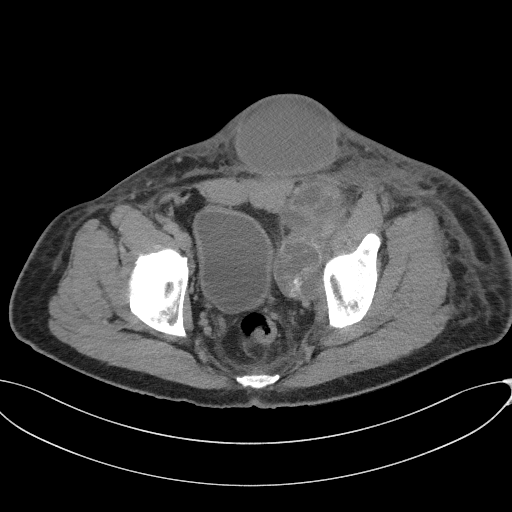
[im 46/72  soft-tissue]
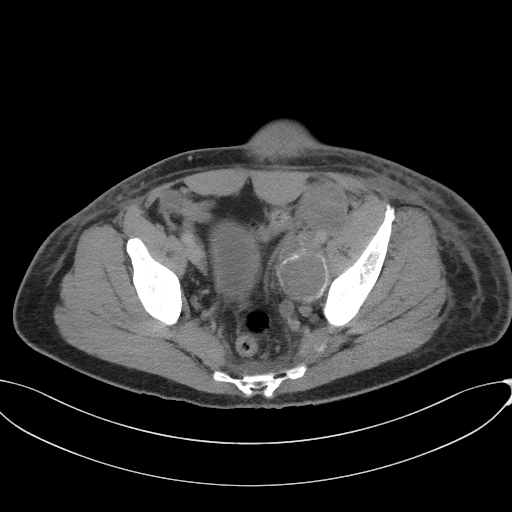
[im 46/72  bone]
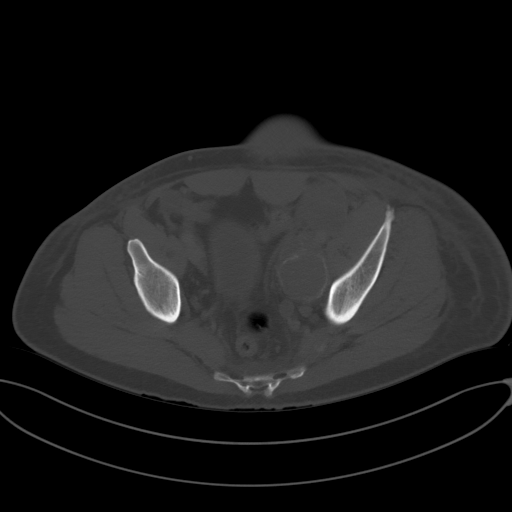
[im 51/72  soft-tissue]
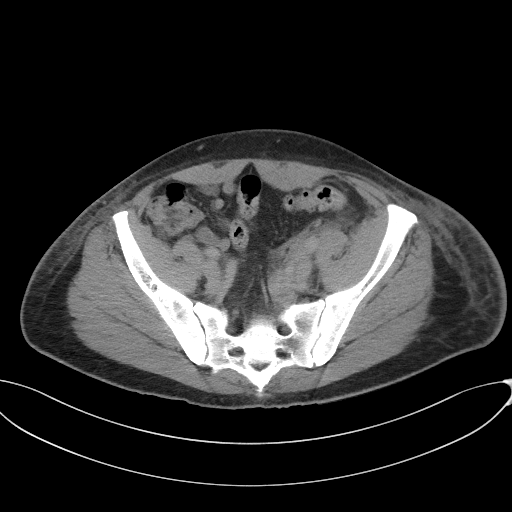
[im 58/72  soft-tissue]
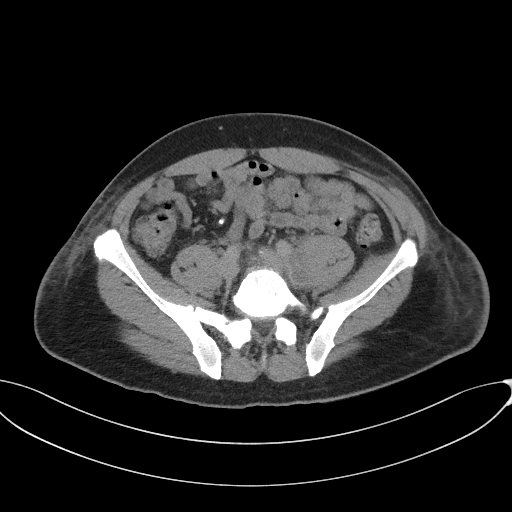
[im 62/72  soft-tissue]
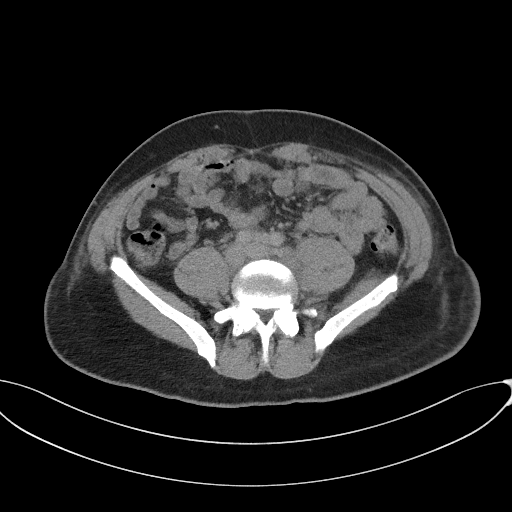
[im 67/72  soft-tissue]
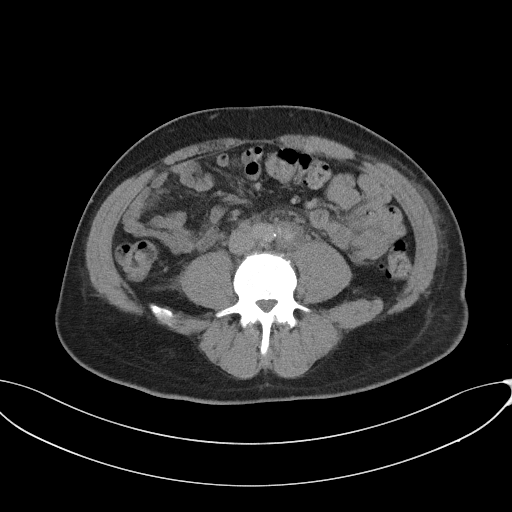

[Series 4: coronal st · coronal · 0.72mm/px · 3 of 95 slices shown]
[im 32/95  soft-tissue]
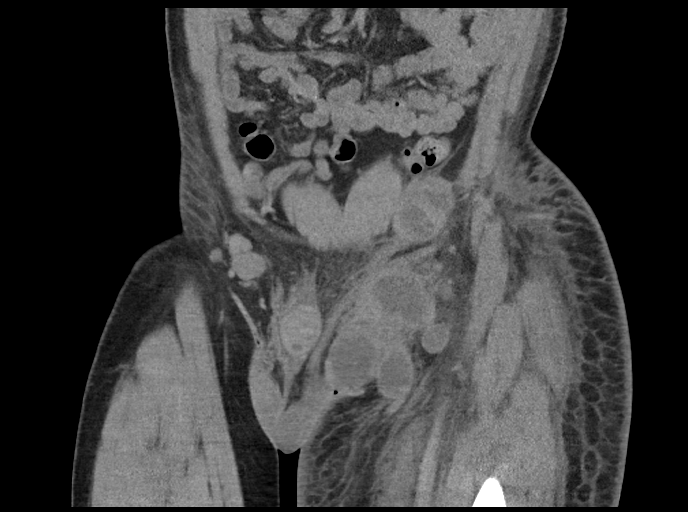
[im 42/95  soft-tissue]
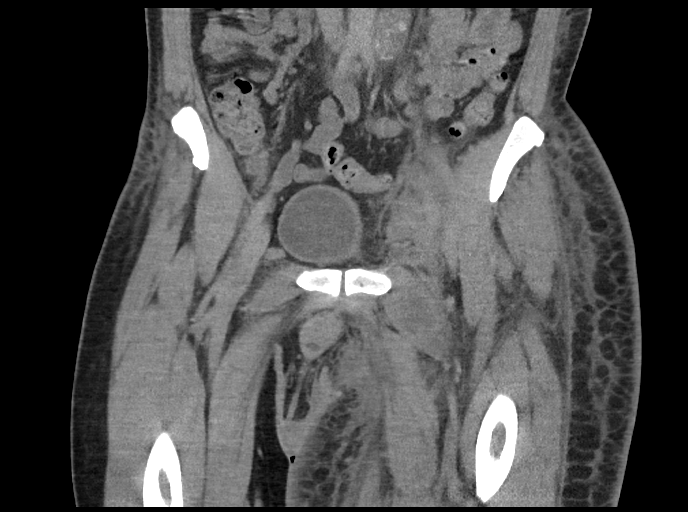
[im 53/95  soft-tissue]
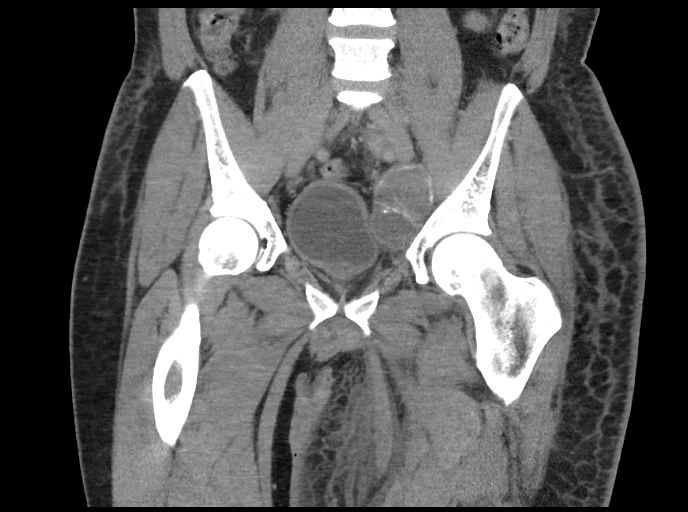

[16 of 46 positions shown; findings below may reference images not displayed]

FINDINGS: Urinary Tract:  Mild bladder wall thickening.

Bowel:  Unremarkable visualized pelvic bowel loops.

Vascular/Lymphatic: Extensive left para-aortic, left iliac, and left
inguinal lymphadenopathy with representative nodes as follows:

1. Left lower para-aortic 26 x 15 mm, previously 28 x 18 mm, series
2: Image 2.
2. Left common iliac, 17 mm short axis, previously 21 mm, [DATE].
3. Left inguinal near bifurcation, 43 mm short axis, previously 45
mm, [DATE].
4. Largest left inguinal, 72 mm short axis, previously 72 mm
remeasured in similar fashion, [DATE].
5. Lower left inguinal, 44 x 38 mm, previously 43 x 38 mm, [DATE].

Interval enlargement of right inguinal lymph node measuring 26 x 19
mm, previously 19 x 14 mm ([DATE]).

Reproductive:  Ulcerative penile mass.  Prostatic calcifications.

Other: Diffuse edema of the left lower extremity, likely related to
lymphatic obstruction.

Musculoskeletal: No suspicious bone lesions identified.
IMPRESSION: 1. Mildly decreased left lower para-aortic and left common iliacs
lymph node size. Stable left inguinal lymph node size. Interval
increase in right inguinal lymph node size.
2. Severe edema of left lower extremity, likely lymphatic
obstruction.

By: Trisha Jumper M.D.
